# Patient Record
Sex: Male | Born: 1940 | Race: Black or African American | Hispanic: No | Marital: Married | Smoking: Never smoker
Health system: Southern US, Community
[De-identification: ages and names within clinical notes are randomized; demographics above are authoritative.]

## PROBLEM LIST (undated history)

## (undated) DIAGNOSIS — E785 Hyperlipidemia, unspecified: Secondary | ICD-10-CM

## (undated) DIAGNOSIS — H409 Unspecified glaucoma: Secondary | ICD-10-CM

## (undated) DIAGNOSIS — I712 Thoracic aortic aneurysm, without rupture, unspecified: Secondary | ICD-10-CM

## (undated) DIAGNOSIS — N400 Enlarged prostate without lower urinary tract symptoms: Secondary | ICD-10-CM

## (undated) DIAGNOSIS — K222 Esophageal obstruction: Secondary | ICD-10-CM

## (undated) DIAGNOSIS — N182 Chronic kidney disease, stage 2 (mild): Secondary | ICD-10-CM

## (undated) DIAGNOSIS — R42 Dizziness and giddiness: Secondary | ICD-10-CM

## (undated) DIAGNOSIS — M1990 Unspecified osteoarthritis, unspecified site: Secondary | ICD-10-CM

## (undated) DIAGNOSIS — K224 Dyskinesia of esophagus: Secondary | ICD-10-CM

## (undated) DIAGNOSIS — I10 Essential (primary) hypertension: Secondary | ICD-10-CM

## (undated) DIAGNOSIS — R001 Bradycardia, unspecified: Secondary | ICD-10-CM

## (undated) DIAGNOSIS — G4730 Sleep apnea, unspecified: Secondary | ICD-10-CM

## (undated) DIAGNOSIS — K4090 Unilateral inguinal hernia, without obstruction or gangrene, not specified as recurrent: Secondary | ICD-10-CM

## (undated) DIAGNOSIS — K219 Gastro-esophageal reflux disease without esophagitis: Secondary | ICD-10-CM

## (undated) DIAGNOSIS — K922 Gastrointestinal hemorrhage, unspecified: Secondary | ICD-10-CM

## (undated) HISTORY — DX: Unspecified osteoarthritis, unspecified site: M19.90

## (undated) HISTORY — PX: EYE SURGERY: SHX253

## (undated) HISTORY — PX: HERNIA REPAIR: SHX51

## (undated) HISTORY — DX: Thoracic aortic aneurysm, without rupture: I71.2

## (undated) HISTORY — DX: Benign prostatic hyperplasia without lower urinary tract symptoms: N40.0

## (undated) HISTORY — DX: Hyperlipidemia, unspecified: E78.5

## (undated) HISTORY — DX: Thoracic aortic aneurysm, without rupture, unspecified: I71.20

## (undated) HISTORY — DX: Dizziness and giddiness: R42

## (undated) HISTORY — DX: Essential (primary) hypertension: I10

## (undated) HISTORY — DX: Esophageal obstruction: K22.2

## (undated) HISTORY — DX: Chronic kidney disease, stage 2 (mild): N18.2

## (undated) HISTORY — PX: TONSILLECTOMY: SUR1361

## (undated) HISTORY — DX: Gastrointestinal hemorrhage, unspecified: K92.2

## (undated) HISTORY — DX: Bradycardia, unspecified: R00.1

## (undated) HISTORY — DX: Unilateral inguinal hernia, without obstruction or gangrene, not specified as recurrent: K40.90

## (undated) HISTORY — DX: Dyskinesia of esophagus: K22.4

---

## 1998-04-15 ENCOUNTER — Ambulatory Visit (HOSPITAL_COMMUNITY): Admission: RE | Admit: 1998-04-15 | Discharge: 1998-04-15 | Payer: Self-pay | Admitting: Cardiology

## 1998-10-24 ENCOUNTER — Encounter: Payer: Self-pay | Admitting: Emergency Medicine

## 1998-10-24 ENCOUNTER — Inpatient Hospital Stay (HOSPITAL_COMMUNITY): Admission: EM | Admit: 1998-10-24 | Discharge: 1998-10-28 | Payer: Self-pay | Admitting: Emergency Medicine

## 1998-10-26 ENCOUNTER — Encounter: Payer: Self-pay | Admitting: Cardiology

## 1998-10-27 ENCOUNTER — Encounter: Payer: Self-pay | Admitting: Cardiology

## 1998-11-15 HISTORY — PX: NECK SURGERY: SHX720

## 1999-10-29 ENCOUNTER — Encounter: Admission: RE | Admit: 1999-10-29 | Discharge: 1999-10-29 | Payer: Self-pay | Admitting: Specialist

## 1999-10-29 ENCOUNTER — Encounter: Payer: Self-pay | Admitting: Specialist

## 2000-01-13 ENCOUNTER — Encounter: Payer: Self-pay | Admitting: Neurosurgery

## 2000-01-15 ENCOUNTER — Inpatient Hospital Stay (HOSPITAL_COMMUNITY): Admission: RE | Admit: 2000-01-15 | Discharge: 2000-01-17 | Payer: Self-pay | Admitting: Neurosurgery

## 2000-01-15 ENCOUNTER — Encounter: Payer: Self-pay | Admitting: Neurosurgery

## 2000-01-21 ENCOUNTER — Encounter: Admission: RE | Admit: 2000-01-21 | Discharge: 2000-01-21 | Payer: Self-pay | Admitting: Neurosurgery

## 2000-01-21 ENCOUNTER — Encounter: Payer: Self-pay | Admitting: Neurosurgery

## 2000-03-03 ENCOUNTER — Encounter: Admission: RE | Admit: 2000-03-03 | Discharge: 2000-03-03 | Payer: Self-pay | Admitting: Neurosurgery

## 2000-03-03 ENCOUNTER — Encounter: Payer: Self-pay | Admitting: Neurosurgery

## 2000-03-31 ENCOUNTER — Encounter: Payer: Self-pay | Admitting: Neurosurgery

## 2000-03-31 ENCOUNTER — Encounter: Admission: RE | Admit: 2000-03-31 | Discharge: 2000-03-31 | Payer: Self-pay | Admitting: Neurosurgery

## 2000-07-07 ENCOUNTER — Encounter: Admission: RE | Admit: 2000-07-07 | Discharge: 2000-07-07 | Payer: Self-pay | Admitting: Neurosurgery

## 2000-07-07 ENCOUNTER — Encounter: Payer: Self-pay | Admitting: Neurosurgery

## 2000-08-25 ENCOUNTER — Ambulatory Visit (HOSPITAL_COMMUNITY): Admission: RE | Admit: 2000-08-25 | Discharge: 2000-08-25 | Payer: Self-pay | Admitting: Cardiology

## 2000-08-25 ENCOUNTER — Encounter: Payer: Self-pay | Admitting: Cardiology

## 2000-08-30 ENCOUNTER — Encounter: Payer: Self-pay | Admitting: Cardiology

## 2000-08-30 ENCOUNTER — Encounter: Admission: RE | Admit: 2000-08-30 | Discharge: 2000-08-30 | Payer: Self-pay | Admitting: Cardiology

## 2001-01-10 ENCOUNTER — Encounter: Payer: Self-pay | Admitting: Neurosurgery

## 2001-01-10 ENCOUNTER — Encounter: Admission: RE | Admit: 2001-01-10 | Discharge: 2001-01-10 | Payer: Self-pay | Admitting: Neurosurgery

## 2002-09-27 ENCOUNTER — Emergency Department (HOSPITAL_COMMUNITY): Admission: EM | Admit: 2002-09-27 | Discharge: 2002-09-27 | Payer: Self-pay

## 2003-02-18 ENCOUNTER — Ambulatory Visit (HOSPITAL_COMMUNITY): Admission: RE | Admit: 2003-02-18 | Discharge: 2003-02-18 | Payer: Self-pay | Admitting: Gastroenterology

## 2004-02-27 ENCOUNTER — Encounter: Admission: RE | Admit: 2004-02-27 | Discharge: 2004-02-27 | Payer: Self-pay | Admitting: Internal Medicine

## 2004-02-28 ENCOUNTER — Encounter: Admission: RE | Admit: 2004-02-28 | Discharge: 2004-02-28 | Payer: Self-pay | Admitting: Internal Medicine

## 2004-02-28 ENCOUNTER — Ambulatory Visit (HOSPITAL_COMMUNITY): Admission: RE | Admit: 2004-02-28 | Discharge: 2004-02-28 | Payer: Self-pay | Admitting: Internal Medicine

## 2004-08-25 ENCOUNTER — Ambulatory Visit (HOSPITAL_BASED_OUTPATIENT_CLINIC_OR_DEPARTMENT_OTHER): Admission: RE | Admit: 2004-08-25 | Discharge: 2004-08-25 | Payer: Self-pay | Admitting: Internal Medicine

## 2005-03-11 ENCOUNTER — Encounter: Admission: RE | Admit: 2005-03-11 | Discharge: 2005-03-11 | Payer: Self-pay | Admitting: Internal Medicine

## 2005-03-29 ENCOUNTER — Encounter (INDEPENDENT_AMBULATORY_CARE_PROVIDER_SITE_OTHER): Payer: Self-pay | Admitting: *Deleted

## 2005-03-29 ENCOUNTER — Ambulatory Visit (HOSPITAL_COMMUNITY): Admission: RE | Admit: 2005-03-29 | Discharge: 2005-03-29 | Payer: Self-pay | Admitting: Gastroenterology

## 2006-04-17 ENCOUNTER — Emergency Department (HOSPITAL_COMMUNITY): Admission: EM | Admit: 2006-04-17 | Discharge: 2006-04-17 | Payer: Self-pay | Admitting: *Deleted

## 2008-02-17 ENCOUNTER — Inpatient Hospital Stay (HOSPITAL_COMMUNITY): Admission: EM | Admit: 2008-02-17 | Discharge: 2008-02-18 | Payer: Self-pay | Admitting: Emergency Medicine

## 2008-03-11 ENCOUNTER — Ambulatory Visit (HOSPITAL_COMMUNITY): Admission: RE | Admit: 2008-03-11 | Discharge: 2008-03-11 | Payer: Self-pay | Admitting: *Deleted

## 2008-10-23 ENCOUNTER — Encounter: Admission: RE | Admit: 2008-10-23 | Discharge: 2008-10-23 | Payer: Self-pay | Admitting: Internal Medicine

## 2008-11-22 ENCOUNTER — Encounter: Admission: RE | Admit: 2008-11-22 | Discharge: 2008-11-22 | Payer: Self-pay | Admitting: Gastroenterology

## 2010-12-09 ENCOUNTER — Encounter
Admission: RE | Admit: 2010-12-09 | Discharge: 2010-12-09 | Payer: Self-pay | Source: Home / Self Care | Attending: Internal Medicine | Admitting: Internal Medicine

## 2010-12-24 ENCOUNTER — Ambulatory Visit (HOSPITAL_COMMUNITY)
Admission: RE | Admit: 2010-12-24 | Discharge: 2010-12-24 | Disposition: A | Discharge status: Home / Self Care | Payer: Medicare Other | Source: Ambulatory Visit | Attending: Gastroenterology | Admitting: Gastroenterology

## 2010-12-24 DIAGNOSIS — H409 Unspecified glaucoma: Secondary | ICD-10-CM | POA: Insufficient documentation

## 2010-12-24 DIAGNOSIS — I10 Essential (primary) hypertension: Secondary | ICD-10-CM | POA: Insufficient documentation

## 2010-12-24 DIAGNOSIS — E876 Hypokalemia: Secondary | ICD-10-CM | POA: Insufficient documentation

## 2010-12-24 DIAGNOSIS — D131 Benign neoplasm of stomach: Secondary | ICD-10-CM | POA: Insufficient documentation

## 2010-12-24 DIAGNOSIS — N400 Enlarged prostate without lower urinary tract symptoms: Secondary | ICD-10-CM | POA: Insufficient documentation

## 2010-12-24 DIAGNOSIS — K219 Gastro-esophageal reflux disease without esophagitis: Secondary | ICD-10-CM | POA: Insufficient documentation

## 2010-12-24 DIAGNOSIS — R1310 Dysphagia, unspecified: Secondary | ICD-10-CM | POA: Insufficient documentation

## 2010-12-29 NOTE — Op Note (Signed)
  NAME:  [name], [name] NO.:  [id number]  MEDICAL RECORD NO.:  [id number]           PATIENT TYPE:  O  LOCATION:  WLEN                         FACILITY:  [facility]  PHYSICIAN:  Danise Edge, M.D.   DATE OF BIRTH:  [birth year]  DATE OF PROCEDURE:  12/24/2010 DATE OF DISCHARGE:                              OPERATIVE REPORT   PROCEDURE:  Diagnostic esophagogastroduodenoscopy.  REFERRING PHYSICIAN:  Georgann Housekeeper, MD.  HISTORY:  [name] is a 70 year old male born Aug 08, 1941. The patient has chronic intermittent esophageal dysphagia.  Two years ago, the patient's barium esophagogram was normal. Esophagogastroduodenoscopy showed a normal esophagus and benign fundic gland gastric polyps.  Esophageal biopsies did not show eosinophilic esophagitis.  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  The patient is on proton pump inhibitor therapy to treat gastroesophageal reflux.  He also takes 2 potassium tablets twice a day. The patient tells me he has a great deal of difficulty consuming his potassium tablets which tend to cause esophageal burning pain.  ENDOSCOPIST:  Danise Edge, M.D.  PREMEDICATIONS: 1. Fentanyl 50 mcg. 2. Versed 7 mg.  DESCRIPTION OF PROCEDURE:  After obtaining informed consent, the patient was placed in the left lateral decubitus position.  I administered intravenous fentanyl and intravenous Versed to achieve conscious sedation for the procedure.  The patient's blood pressure, oxygen saturation and cardiac rhythm were monitored throughout the procedure and documented in the medical record.  The Pentax gastroscope was passed through the posterior hypopharynx into the proximal esophagus without difficulty.  The hypopharynx, larynx and vocal cords appeared normal.  Esophagoscopy:  The proximal mid and lower  segments of the esophageal mucosa appear completely normal.  There was no endoscopic evidence for the presence of erosive esophagitis, esophageal stricture formation, Barrett's esophagus or esophageal tumors.  Gastroscopy:  Retroflexed view of the gastric cardia and fundus was normal.  There are scattered small benign fundic gland gastric polyps less than 5 mm in diameter.  The gastric antrum and pylorus appear normal.  Duodenoscopy:  The duodenal bulb and descending duodenum appeared normal.  ASSESSMENT:  Normal esophagogastroduodenoscopy except for the presence of benign fundic gland gastric polyps less than 5 mm in diameter.  PLAN:  The patient must have esophageal dysmotility as a cause for his chronic intermittent dysphagia.  I will schedule him for esophageal manometry to look for achalasia.          ______________________________ Danise Edge, M.D.     MJ/MEDQ  D:  12/24/2010  T:  12/24/2010  Job:  [id number]  cc:   Georgann Housekeeper, MD Fax: [phone]  Electronically Signed by Danise Edge M.D. on 12/29/2010 02:38:14 PM

## 2011-03-01 ENCOUNTER — Ambulatory Visit (HOSPITAL_COMMUNITY)
Admission: RE | Admit: 2011-03-01 | Discharge: 2011-03-01 | Disposition: A | Discharge status: Home / Self Care | Payer: Medicare Other | Source: Ambulatory Visit | Attending: Gastroenterology | Admitting: Gastroenterology

## 2011-03-01 DIAGNOSIS — R1310 Dysphagia, unspecified: Secondary | ICD-10-CM | POA: Insufficient documentation

## 2011-03-09 ENCOUNTER — Observation Stay (HOSPITAL_COMMUNITY)
Admission: EM | Admit: 2011-03-09 | Discharge: 2011-03-12 | Disposition: A | Discharge status: Home / Self Care | Payer: Medicare Other | Attending: Internal Medicine | Admitting: Internal Medicine

## 2011-03-09 DIAGNOSIS — F411 Generalized anxiety disorder: Secondary | ICD-10-CM | POA: Insufficient documentation

## 2011-03-09 DIAGNOSIS — K224 Dyskinesia of esophagus: Secondary | ICD-10-CM | POA: Insufficient documentation

## 2011-03-09 DIAGNOSIS — Q391 Atresia of esophagus with tracheo-esophageal fistula: Secondary | ICD-10-CM | POA: Insufficient documentation

## 2011-03-09 DIAGNOSIS — K219 Gastro-esophageal reflux disease without esophagitis: Secondary | ICD-10-CM | POA: Insufficient documentation

## 2011-03-09 DIAGNOSIS — K5730 Diverticulosis of large intestine without perforation or abscess without bleeding: Secondary | ICD-10-CM | POA: Insufficient documentation

## 2011-03-09 DIAGNOSIS — I10 Essential (primary) hypertension: Secondary | ICD-10-CM | POA: Insufficient documentation

## 2011-03-09 DIAGNOSIS — R195 Other fecal abnormalities: Principal | ICD-10-CM | POA: Insufficient documentation

## 2011-03-09 HISTORY — DX: Essential (primary) hypertension: I10

## 2011-03-09 LAB — PROTIME-INR
INR: 0.98 (ref 0.00–1.49)
Prothrombin Time: 13.2 seconds (ref 11.6–15.2)

## 2011-03-09 LAB — DIFFERENTIAL
Basophils Absolute: 0 10*3/uL (ref 0.0–0.1)
Basophils Relative: 1 % (ref 0–1)
Eosinophils Absolute: 0.1 10*3/uL (ref 0.0–0.7)
Eosinophils Relative: 2 % (ref 0–5)
Lymphocytes Relative: 24 % (ref 12–46)
Lymphs Abs: 1.5 10*3/uL (ref 0.7–4.0)
Monocytes Absolute: 0.7 10*3/uL (ref 0.1–1.0)
Monocytes Relative: 11 % (ref 3–12)
Neutro Abs: 4.1 10*3/uL (ref 1.7–7.7)
Neutrophils Relative %: 64 % (ref 43–77)

## 2011-03-09 LAB — COMPREHENSIVE METABOLIC PANEL
ALT: 15 U/L (ref 0–53)
AST: 18 U/L (ref 0–37)
Albumin: 3.6 g/dL (ref 3.5–5.2)
Alkaline Phosphatase: 69 U/L (ref 39–117)
BUN: 13 mg/dL (ref 6–23)
CO2: 27 mEq/L (ref 19–32)
Calcium: 8.5 mg/dL (ref 8.4–10.5)
Chloride: 104 mEq/L (ref 96–112)
Creatinine, Ser: 1.01 mg/dL (ref 0.4–1.5)
GFR calc Af Amer: >60 mL/min (ref >60)
GFR calc non Af Amer: >60 mL/min (ref >60)
Glucose, Bld: 94 mg/dL (ref 70–99)
Potassium: 3.3 mEq/L — ABNORMAL LOW (ref 3.5–5.1)
Sodium: 138 mEq/L (ref 135–145)
Total Bilirubin: 0.6 mg/dL (ref 0.3–1.2)
Total Protein: 6.7 g/dL (ref 6.0–8.3)

## 2011-03-09 LAB — URINALYSIS, ROUTINE W REFLEX MICROSCOPIC
Bilirubin Urine: NEGATIVE
Glucose, UA: NEGATIVE mg/dL
Hgb urine dipstick: NEGATIVE
Ketones, ur: NEGATIVE mg/dL
Nitrite: NEGATIVE
Protein, ur: NEGATIVE mg/dL
Specific Gravity, Urine: 1.011 (ref 1.005–1.030)
Urobilinogen, UA: 0.2 mg/dL (ref 0.0–1.0)
pH: 7 (ref 5.0–8.0)

## 2011-03-09 LAB — CBC
HCT: 42.5 % (ref 39.0–52.0)
Hemoglobin: 14.7 g/dL (ref 13.0–17.0)
MCH: 30.3 pg (ref 26.0–34.0)
MCHC: 34.6 g/dL (ref 30.0–36.0)
MCV: 87.6 fL (ref 78.0–100.0)
Platelets: 185 10*3/uL (ref 150–400)
RBC: 4.85 MIL/uL (ref 4.22–5.81)
RDW: 13.6 % (ref 11.5–15.5)
WBC: 6.4 10*3/uL (ref 4.0–10.5)

## 2011-03-09 LAB — TYPE AND SCREEN
ABO/RH(D): AB POS
Antibody Screen: NEGATIVE

## 2011-03-09 LAB — ABO/RH: ABO/RH(D): AB POS

## 2011-03-09 LAB — APTT: aPTT: 27 seconds (ref 24–37)

## 2011-03-10 LAB — CBC
HCT: 41.8 % (ref 39.0–52.0)
Hemoglobin: 14.4 g/dL (ref 13.0–17.0)
MCH: 30.1 pg (ref 26.0–34.0)
MCHC: 34.4 g/dL (ref 30.0–36.0)
MCV: 87.4 fL (ref 78.0–100.0)
Platelets: 198 10*3/uL (ref 150–400)
RBC: 4.78 MIL/uL (ref 4.22–5.81)
RDW: 13.7 % (ref 11.5–15.5)
WBC: 6.4 10*3/uL (ref 4.0–10.5)

## 2011-03-10 LAB — HEMOCCULT GUIAC POC 1CARD (OFFICE): Fecal Occult Bld: POSITIVE

## 2011-03-10 NOTE — Op Note (Signed)
  NAME:  [name], [name] NO.:  [id number]  MEDICAL RECORD NO.:  [id number]           PATIENT TYPE:  O  LOCATION:  5504                         FACILITY:  [facility]  PHYSICIAN:  Danise Edge, M.D.   DATE OF BIRTH:  1941-03-24  DATE OF PROCEDURE:  03/10/2011 DATE OF DISCHARGE:                              OPERATIVE REPORT   HISTORY:  [name] is a 70 year old male born [birth year]. The patient has chronic intermittent esophageal dysphagia.  Two years ago, the patient's barium esophagram was normal. Esophagogastroduodenoscopy showed a normal esophagus and benign fundic gland gastric polyps.  Esophageal biopsies did not show eosinophilic esophagitis.  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  The patient is on proton pump inhibitor therapy to treat gastroesophageal reflux.  He does take two potassium tablets daily.  The patient tells me that he has a great deal of difficulty consuming his potassium tablets, which tend to cause esophageal burning discomfort.  On December 24, 2010, the patient underwent a diagnostic esophagogastroduodenoscopy, which was normal except for the presence of benign fundic gland gastric polyps less than 5 mm in diameter.  Esophageal manometry was scheduled to rule out achalasia.  Unfortunately, the patient had a great deal of difficulty during the examination.  He experienced gagging, which compromised data obtained from the upper esophageal sphincter and upper esophageal body.  Upper esophageal sphincter data:  No data obtained.  Esophageal body data:  Studies were obtained from the lower esophageal body and not the upper esophageal body.  Ten wet swallows were performed, resulting in 90% peristaltic contractions and 10% retrograde contractions.  Peristaltic contractions had a normal wave  amplitude of 138 mmHg.  Lower esophageal sphincter data:  The resting lower esophageal sphincter pressure is elevated at 60 mmHg.  There is 91% relaxation of the lower esophageal sphincter with swallows leaving a residual pressure of 5.1 mmHg, which is normal.  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  There is 10% non-peristaltic esophageal body contractions with swallows.  RECOMMENDATIONS:  I would recommend discontinuing potassium chloride tablets, which can cause esophageal mucosal injury.  I would switch to liquid potassium chloride if potassium is necessary.  I would also recommend using sublingual nitroglycerin to treat episodes of esophageal dysphagia.  The patient does not meet the criteria for achalasia.  If the above recommendations do not improve his symptoms, he may be a candidate for at least one trial of Botox injection into the lower esophageal sphincter muscle.          ______________________________ Danise Edge, M.D.  MJ/MEDQ  D:  03/10/2011  T:  03/10/2011  Job:  [id number]  cc:   Georgann Housekeeper, MD Fax: [phone]  Electronically Signed by Danise Edge M.D. on 03/10/2011 12:08:14 PM

## 2011-03-11 ENCOUNTER — Observation Stay (HOSPITAL_COMMUNITY): Payer: Medicare Other

## 2011-03-11 ENCOUNTER — Encounter (HOSPITAL_COMMUNITY): Payer: Self-pay | Admitting: Radiology

## 2011-03-11 LAB — CBC
HCT: 38.7 % — ABNORMAL LOW (ref 39.0–52.0)
Hemoglobin: 13.2 g/dL (ref 13.0–17.0)
MCH: 30.2 pg (ref 26.0–34.0)
MCHC: 34.1 g/dL (ref 30.0–36.0)
MCV: 88.6 fL (ref 78.0–100.0)
Platelets: 181 10*3/uL (ref 150–400)
RBC: 4.37 MIL/uL (ref 4.22–5.81)
RDW: 13.6 % (ref 11.5–15.5)
WBC: 5.3 10*3/uL (ref 4.0–10.5)

## 2011-03-11 MED ORDER — IOHEXOL 300 MG/ML  SOLN
100.0000 mL | Freq: Once | INTRAMUSCULAR | Status: AC | PRN
  Administered 2011-03-11: 100 mL via INTRAVENOUS

## 2011-03-12 LAB — CBC
HCT: 37.6 % — ABNORMAL LOW (ref 39.0–52.0)
Hemoglobin: 12.7 g/dL — ABNORMAL LOW (ref 13.0–17.0)
MCH: 29.8 pg (ref 26.0–34.0)
MCHC: 33.8 g/dL (ref 30.0–36.0)
MCV: 88.3 fL (ref 78.0–100.0)
Platelets: 191 10*3/uL (ref 150–400)
RBC: 4.26 MIL/uL (ref 4.22–5.81)
RDW: 13.7 % (ref 11.5–15.5)
WBC: 5.8 10*3/uL (ref 4.0–10.5)

## 2011-03-14 NOTE — Discharge Summary (Signed)
NAMEFAITH, Bob Elliott NO.:  [id number]  MEDICAL RECORD NO.:  [id number]           PATIENT TYPE:  O  LOCATION:  5504                         FACILITY:  [facility]  PHYSICIAN:  Georgann Housekeeper, MD      DATE OF BIRTH:  September 09, 1941  DATE OF ADMISSION:  03/09/2011 DATE OF DISCHARGE:  03/12/2011                              DISCHARGE SUMMARY   DISCHARGING DIAGNOSES: 1. GI bleed, melena with possible source, diverticular bleed. 2. Hypertension. 3. Anxiety. 4. Gastroesophageal reflux disease, esophageal dysmotility. 5. History of Schatzki's ring.  MEDICATIONS ON DISCHARGE: 1. Cardura 4 mg at bedtime. 2. Mavik 4 mg b.i.d. 3. Xanax 1 mg at bedtime p.r.n. 4. Flonase nasal spray, 2 sprays daily p.r.n. 5. Nexium 40 mg daily. 6. Potassium 10 mEq 2 tablets twice a day. 7. Claritin 10 mg 1 daily p.r.n. 8. Diltiazem 180 mg 3 tablets (360 mg daily). 9. Carafate 1 gram per 10 mL suspension 10 mL t.i.d. p.r.n.  PROCEDURES: 1. Colonoscopy showed several diverticuli in the cecum, no active     bleed. 2. EGD showed Schatzki's ring, no evidence of ulcer or acute bleed.     CT scan of the abdomen negative for any tumor, incidental benign     renal cyst.  CONSULTATION:  GI.  LABORATORY DATA:  Blood counts:  Hemoglobin initially on admission was 14, dropped down to 12.7, white count of 5.8, platelets 191,000. Chemistries:  Sodium 130, potassium 3.3, creatinine 1.0, BUN of 13.  HOSPITAL COURSE:  A 70 year old male admitted with melena, possible hematochezia, hemoglobin was normal, hemodynamically stable. 1. GI:  The patient had a GI consultation for evaluation of his source     of the GI bleed.  He underwent an EGD which revealed only     Schatzki's without any active source of bleeding or ulcer.     Subsequently he went to have a colonoscopy which showed several     cecal diverticular areas without any active source thought to be     probable fecal diverticular bleed, resolved  spontaneously.  He had     a CT of the abdomen and pelvic for evaluation of small bowel area     which did not show any acute pathology, incidental renal cyst. 2. Esophageal dysmotility and GERD.  The patient had recently     outpatient workup which included esophageal manometry which showed     some esophageal dysmotility as far as history of longstanding GERD     with Schatzki's ring, could give intermittent dysphagia.  The     patient continued on Nexium.  Carafate was added as p.r.n.  No     aspirin products. 3. As far as the hypertension and hypokalemia, continue on blood     pressure medication.  Blood pressure remained stable for potassium.     Discussion about potassium elixir versus continuing the pill, we     will discuss that outpatient, encourage him to use lot of water     when he takes potassium tablet. 4. As far as his anxiety, continue on Xanax p.r.n.  I will see  him     back in 2 weeks outpatient.  Discharge planning taken over 30 minutes.     Georgann Housekeeper, MD     KH/MEDQ  D:  03/12/2011  T:  03/12/2011  Job:  [id number]  Electronically Signed by Georgann Housekeeper MD on 03/14/2011 11:37:47 AM

## 2011-03-15 NOTE — H&P (Signed)
NAME:  ANITA, MCADORY NO.:  [id number]  MEDICAL RECORD NO.:  [id number]           PATIENT TYPE:  E  LOCATION:  [facility]                         FACILITY:  [facility]  PHYSICIAN:  Lonia Blood, M.D.       DATE OF BIRTH:  02-Jun-1941  DATE OF ADMISSION:  03/09/2011 DATE OF DISCHARGE:                             HISTORY & PHYSICAL   PRIMARY CARE PHYSICIAN:  Georgann Housekeeper, MD  CHIEF COMPLAINT:  Bloody stools, then the patient changed his story and said black stools.  HISTORY OF PRESENT ILLNESS:  Mr. Dentinger is a 70 year old gentleman with history of osteoarthritis, hypertension, glaucoma, who presents to the emergency room today complaining of some burning abdominal pain as well as passing 2 bloody bowel movements.  When asked further, he claims he has been having black stools for the past week since having some tests which sounds like an esophageal manometry test.  The patient said he got that test because of dysphagia and ever since the test he has been feeling kind of sick and passing black stools.  He has no nausea, no vomiting.  No episodes of syncope.  He took briefly some Aleve for about 4 days last week for some shoulder pain.  PAST MEDICAL HISTORY:  Hypertension, osteoarthritis, gastroesophageal reflux disease, and glaucoma.  SOCIAL HISTORY:  He is retired.  Does not smoke cigarettes, does not drink alcohol.  Lives with his wife.  FAMILY HISTORY:  Positive hypertension.  REVIEW OF SYSTEMS:  As per HPI.  Also positive for dysphagia, shoulder pain, knee pain, otherwise negative.  PHYSICAL EXAMINATION:  VITAL SIGNS:  Upon admission, temperature 98.2, blood pressure 142/81, pulse 79, respirations 20, saturation 95% on room air.  GENERAL APPEARANCE:  Well-developed, nourished, no acute distress. HEENT:  Head normocephalic, atraumatic.  Eyes, pupils equal, round, react to light and accommodation.  Extraocular movements intact.  Throat clear. NECK:  Supple.  No  JVD. CHEST:  Clear to auscultation without wheezes, rhonchi, or crackles. HEART:  Regular rate and rhythm without murmurs, rubs, or gallops. ABDOMEN:  Soft and with some mild epigastric tenderness.  No rebound, no guarding. EXTREMITIES:  Lower extremities without edema. SKIN:  Warm, dry.  No suspicious looking rashes.  LABORATORY VALUES:  On admission, white blood cell count is 6.4, hemoglobin 14.7, platelet count is 185.  Sodium 138, potassium 3.3, chloride 104, BUN 13, creatinine 1.  Urinalysis within normal limits.  ASSESSMENT AND PLAN:  This is a 70 year old gentleman who is status post and unspecified gastroenterology and urological procedure status post NSAID use, coming in the hospital complaining of melena and bloody bowel movements.  Emergency room physician has performed a rectal exam and found heme-positive stools.  There are also some nonbleeding hemorrhoids.  The patient is placed on observation overnight with a repeat CBC in the morning.  Clear liquid diet.  Emergency room physician has already contacted Dr. Dulce Sellar who is going to see the patient in consultation. Otherwise, Mr. Fidalgo will be continued on his Cardura, Dilacor, Mavik, potassium, and Xanax.     Lonia Blood, M.D.     SL/MEDQ  D:  03/09/2011  T:  03/09/2011  Job:  [id number]  cc:   Georgann Housekeeper, MD  Electronically Signed by Lonia Blood M.D. on 03/15/2011 09:18:05 PM

## 2011-03-26 NOTE — Consult Note (Signed)
Bob Elliott, GAPPA NO.:  [id number]  MEDICAL RECORD NO.:  [id number]          PATIENT TYPE:  LOCATION:                                 FACILITY:  PHYSICIAN:  Bob Elliott, M.D.   DATE OF BIRTH:  [birth year]  DATE OF CONSULTATION:  03/10/2011 DATE OF DISCHARGE:                                CONSULTATION   Dr. Donette Larry asked [place] to see this 70 year old gentleman because of GI bleeding.  The patient has been followed by Dr. Danise Edge for esophageal dysphagia symptoms.  Although a recent upper GI series raised the question of a distal esophageal stricture, Dr. Henriette Combs recent endoscopy did not, so no dilatation was performed.  An esophageal manometry was performed about a week ago and was a difficult exam due to gagging and retching, but per Dr. Henriette Combs verbal report to me, it showed a hypertensive lower esophageal sphincter and some retrograde peristalsis.  With that background, the patient was noted to have some to make had several large bloody bowel movements, fairly bright red, I believe beginning yesterday morning.  There is no frank syncope, but I think he might have had some mild dizziness.  Prior to this, since the manometry, he had noted what he thought were some dark stools, but they were distinctly different from the above-mentioned bloody stool that began yesterday.  He came to the emergency room where his hemoglobin was stable.  Of note, the patient had colonoscopy in March 2009 by Dr. Laural Benes that showed some small cecal diverticula.  His upper endoscopy, in February of this year, was normal.  PAST MEDICAL HISTORY:  Allergies to ASPIRIN (GI upset) and CODEINE (mental status changes).  OUTPATIENT MEDICATIONS:  Not recorded, but in the hospital he is on Xanax, diltiazem, Cardura, pantoprazole, MiraLax, potassium chloride, sucralfate __________.  OPERATIONS:  I do not believe there has been prior abdominal surgery.  CHRONIC MEDICAL  ILLNESSES: 1. Hypertension. 2. DJD. 3. GERD.  PHYSICAL EXAMINATION:  GENERAL:  A stout well-developed African American male in no acute distress. EYES:  Anicteric.  No evident pallor. CHEST:  Clear. HEART:  Normal. ABDOMEN:  Without guarding, mass, or tenderness. RECTAL:  Fairly dark maroon, almost burgundy, pasty bloody stool.  LABORATORY DATA:  White count 6400, admission hemoglobin 14.7, following overnight hydration 14.4, platelets 185.  Differential count normal. INR 1.0.  Chemistry panel pertinent for BUN of 13 on admission with creatinine of 1.0.  Normal liver chemistries.  Albumin 3.6.  IMPRESSION:  Non-destabilizing gastrointestinal bleed, most likely of lower tract origin given the appearance of the stool, his hemodynamic stability, and normal hemoglobin and the normal BUN.  PLAN:  Begin with endoscopic evaluation at this time in view of the history of dark stools and recent traumatic manometry.  Further management to depend on those findings.  The procedure has been discussed with the patient and he is agreeable to proceed.  If it is negative, colonoscopic evaluation would then be appropriate since it has been 3 years since his last colonoscopy.  I would wonder about a proximal diverticular origin of his bleeding, given the relatively dark stool  and the fact that the BUN is normal.          ______________________________ Bob Elliott, M.D.     RB/MEDQ  D:  03/10/2011  T:  03/11/2011  Job:  [id number]  cc:   Georgann Housekeeper, MD  Electronically Signed by Bob Elliott M.D. on 03/26/2011 10:08:47 AM

## 2011-03-30 NOTE — Op Note (Signed)
NAME:  ROLEN, CONGER NO.:  [id number]   MEDICAL RECORD NO.:  [id number]          PATIENT TYPE:  AMB   LOCATION:  DAY                          FACILITY:  [facility]   PHYSICIAN:  Alfonse Ras, MD   DATE OF BIRTH:  [birth year]   DATE OF PROCEDURE:  03/11/2008  DATE OF DISCHARGE:                               OPERATIVE REPORT   PREOPERATIVE DIAGNOSIS:  Umbilical hernia, incarcerated.   POSTOPERATIVE DIAGNOSIS:  Umbilical hernia, incarcerated.   PROCEDURE:  Incarcerated umbilical hernia repair with mesh, Bard 1 x 4  mesh.   ANESTHESIA:  General endotracheal tube.   SURGEON:  Alfonse Ras, MD.   DESCRIPTION:  The patient was taken to the operating room after proper  informed consent, explaining risks, benefits, and options to the patient  was obtained.  He was given general endotracheal anesthesia after being  placed in the supine position.  The abdomen was prepped and draped in  normal sterile fashion.  Using a transverse infraumbilical incision, I  dissected down onto the hernia sac.  It was mobilized completely along  the fascial defect which measured only about 1 cm.  Hernia contents were  reduced into the abdominal cavity and the defect was closed with  interrupted #1 Novofil.  A small piece of 8 Bard mesh was then placed  over the repair and tacked in the periphery using a 2-0 Prolene suture.  Subcutaneous tissue was closed with a running 3-0 Vicryl.  Skin was  closed with subcuticular 4-0 Monocryl.  Steri-Strips and sterile  dressings were applied.  Tissues were injected using 0.5 Marcaine.  The  patient tolerated the procedure well and went to PACU in good condition.      Alfonse Ras, MD  Electronically Signed     KRE/MEDQ  D:  03/11/2008  T:  03/11/2008  Job:  [id number]

## 2011-03-30 NOTE — H&P (Signed)
NAME:  Bob Elliott, Bob Elliott NO.:  [id number]   MEDICAL RECORD NO.:  [id number]          PATIENT TYPE:  EMS   LOCATION:  MAJO                         FACILITY:  [facility]   PHYSICIAN:  Gardiner Barefoot, MD    DATE OF BIRTH:  04/15/1941   DATE OF ADMISSION:  02/17/2008  DATE OF DISCHARGE:                              HISTORY & PHYSICAL   PRIMARY CARE PHYSICIAN:  Georgann Housekeeper, M.D., [facility].   CHIEF COMPLAINT:  Chest pain.   HISTORY OF PRESENT ILLNESS:  This is a 70 year old male with a history  of cervical radiculopathy, who presents here with acute onset of right  arm chest pain, actually it was from the posterior neck down his arm.  This started about 10:00 p.m. while playing cards. This evolved into mid  sternal chest pressure and chest pain and tightness at approximately 1  to 2 a.m. this morning and patient was brought into the emergency room  for evaluation. The patient did take a Darvocet and his chest pain did  eventually improve. The patient reports this pain is not similar to the  pain he has had associated with his cervical pain. Otherwise, he had had  no significant changes and no fever or any other symptoms. He denies  nausea and vomiting. No diaphoresis. He does report some shortness of  breath that is position, lying on the right side.   PAST MEDICAL HISTORY:  1. Hypertension.  2. Gastroesophageal reflux disease.  3. Anxiety.  4. Arthritis.  5. Glaucoma.   MEDICATIONS:  1. Cardura 4 mg p.o. daily.  2. Darvocet N-100 p.r.n.  3. Dilacor XR 360 mg q. a.m.  4. Mavik 4 mg p.o. b.i.d.  5. Potassium chloride 20 meq p.o. b.i.d.  6. Xanax 1 mg q.h.s.   ALLERGIES:  ASPIRIN, CODEINE.   SOCIAL HISTORY:  The patient denies smoking, alcohol, or drug use.   FAMILY HISTORY:  Noncontributory.   REVIEW OF SYSTEMS:  Negative except as per the HPI.   PHYSICAL EXAMINATION:  VITAL SIGNS:  Temperature 97.9, pulse 52,  respiratory rate 18. Blood pressure  128/84. O2 sat 98%.  GENERAL:  The patient is awake, alert and oriented x3 and appears in no  acute distress.  CARDIOVASCULAR:  Regular rate and rhythm. No murmur, rub, or gallop.  LUNGS:  Clear to auscultation bilaterally.  ABDOMEN:  Soft, nontender, and mild distention. Obese with some  umbilical hernia.  EXTREMITIES:  No clubbing, cyanosis, or edema.   LABORATORY DATA:  CBC within normal limits. CK MB less than 1.0.  Troponin is less than 0.05. Potassium 3.3. Otherwise, labs are normal.   Chest x-ray with no acute disease.   ASSESSMENT/PLAN:  1. Chest pain. Will admit the patient for observation on telemetry and      cycle enzymes and treat chest pain as needed.  2. Hypertension. Will continue the patient's home medications.      Gardiner Barefoot, MD  Electronically Signed     RWC/MEDQ  D:  02/17/2008  T:  02/17/2008  Job:  [id number]   cc:  Georgann Housekeeper, MD

## 2011-03-30 NOTE — Discharge Summary (Signed)
NAMEELVA, BREAKER NO.:  [id number]   MEDICAL RECORD NO.:  [id number]          PATIENT TYPE:  INP   LOCATION:  3733                         FACILITY:  [facility]   PHYSICIAN:  Deirdre Peer. Polite, M.D. DATE OF BIRTH:  Mar 09, 1941   DATE OF ADMISSION:  02/17/2008  DATE OF DISCHARGE:  02/18/2008                               DISCHARGE SUMMARY   DISCHARGE DIAGNOSES:  1. Atypical chest pain.  EKG, nonacute cardiac. Enzymes negative x3.      CT of the chest negative despite of minimally elevated D-dimer of      0.6.  2. Cervical disc disease.  3. Hypertension.  4. Anxiety.  5. Glaucoma.   DISCHARGE MEDICATIONS:  The patient to resume home medications, which  includes:  1. Cardura 4 mg daily.  2. Darvocet q.6 h. p.r.n.  3. Dilacor 360 mg daily.  4. Mavik 4 mg b.i.d.  5. Potassium 20 mEq b.i.d.  6. Xanax 1 mg nightly.  The patient was also to resume his eye drop that he states he takes  Betoptic nightly.   STUDIES:  CT of the chest negative for PE, cardiac enzymes negative x3.  Troponin level 0.6.  BMET within normal limits except for mild  hypokalemia at 3.3.  Lipid panel, total cholesterol 179, HDL 37, and LDL  115.   HISTORY OF PRESENT ILLNESS:  A 70 year old male presented to the ED with  atypical chest pain.  He described pain in his right arm, right chest,  and also on his right neck.  Stated it was different than his typical  neck pain.  This pain occurred at rest in the ED.  The patient was  evaluated.  Admission was deemed necessary for further evaluation and  treatment.  On further question, the patient thinks that he may have had  a normal stress test with minimal years 1-2 years.   PAST MEDICAL HISTORY:  As stated above.   MEDICATIONS ON ADMISSION:  As stated above.   ALLERGIES:  Per admission H and P.   SOCIAL HISTORY:  Negative tobacco, alcohol, or drugs.   HOSPITAL COURSE:  The patient was admitted to telemetry floor bed for  evaluation and  treatment of chest pain.  Enzymes at cycle, they were  negative.  The patient was continued on monitor without arrhythmia.  The  patient had D-dimer that was mildly elevated.  Followup CT of the chest  was ordered which was negative for PE.  The patient  did not have any recurrent pain in the hospital and thinks that his pain  was related to his neck at the time of discharge.  He plans to followup  with his primary MD and his abdomen specialist.  The patient is  medically stable for discharge to home.      Deirdre Peer. Polite, M.D.  Electronically Signed     RDP/MEDQ  D:  02/18/2008  T:  02/18/2008  Job:  [id number]   cc:   Georgann Housekeeper, MD

## 2011-04-02 NOTE — H&P (Signed)
[place]. [facility]  Patient:    Bob Elliott, Bob Elliott                          MRN: [id number] Adm. 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  INDICATIONS:  Mr. Yehudah Standing presented to my office on December 07, 1999 for evaluation of pain in the left upper extremity, which he has had for six years. He was seen and evaluated by Dr. Vira Browns for a three-year period.  By his account, when he mentioned that he wanted a second opinion he and his physician had somewhat of a falling out and he sought care elsewhere.  He has had no bowel or  bladder dysfunction.  He does have numbness in the hands, along with tingling. He has had pain in the left shoulder and left arm for a long period of time.  He has some minor tingling in the right hand, which is relatively new for him.  Almost all of his problems have been in the left upper extremity.  He is currently retired.  REVIEW OF SYSTEMS:  Positive for glaucoma, nasal congestion, sinus problems, hypertension, difficulty starting the urinary stream, arm weakness, back pain, rm pain, joint pain, arthritis, and neck pain.  He denies constitutional, respiratory, gastrointestinal, skin, neurological, psychiatric, endocrine, hematologic, and allergic problems.  PAST MEDICAL HISTORY:  Hypertension and gastrointestinal problems.  ALLERGIES: 1. ASPIRIN. 2. CODEINE.  CURRENT MEDICATIONS: 1. Dilacor XR 240/380 mg once per day. 2. OptiPranolol 1 drop twice a day for glaucoma. 3. K-Dur 20 mEq. 4. ______ hydrochlorothiazide 12.5 mg. 5. Alprazolam 1 mg once per day. 6. Vioxx 25 mg once per day.  FAMILY HISTORY:  Mother age 72 and has heart disease and problems with potassium. Father is deceased.  Father had prostate cancer and a cerebrovascular accident.  Colon cancer and diabetes is present in the  family history.  PAST SURGICAL HISTORY:  No previous operations.  SOCIAL HISTORY:  He does not smoke nor does he drink.  He had a problem with alcohol in the past.  PHYSICAL EXAMINATION:  VITAL SIGNS:  He is 5 feet 8-1/4 inches tall, weighing 208 pounds.  He has a pulse of 80 on exam.  GENERAL:  Mr. Hensley is alert and oriented x 4.  Answers all questions appropriately.  Memory, language, and attention span are normal.  He is well-kept and in no distress.  NEUROLOGIC:  Pupils are equal, round and reactive to light.  Funduscopic examination is normal.  Visual fields are full.  He has symmetric facial sensation and symmetric facial movements.  Hearing is intact to voice bilaterally.  Uvula  elevates in the midline.  Shoulder shrug is normal.  Tongue protrudes in the midline.  Slight weakness in the left deltoid at 4/5.  Otherwise very good strength in the left upper extremity.  Normal strength on the right side.  He has good grip, good intrinsics, good wrist extension.  No weakness in the lower extremities. Muscle tone, bulk, and coordination are normal.  He has 2+ reflexes in the upper and lower extremities.  No Hoffmanns sign.  No clonus was appreciated.  He has downgoing toes to plantar stimulation.  Pulses are good at the wrists and feet bilaterally.  LUNGS:  Lung fields are clear.  HEART:  Regular rhythm and rate.  No murmurs or rubs.  NECK:  Cervical area, no bruits or masses.  IMPRESSION:  Plain x-ray shows significant spondylitic disease at C5-6, 6-7, and 4-5.  He has anterior spurs at C4, C5, and C6-7.  He has a narrow disk space at  C5-6 and at C4-5.  Oblique films show foraminal stenosis at 5-6, 6-7 where it is most severe, and some also at C4-5.  Mr. Gaulin MRI showed two disks at 5-6 and 6-7, both on the left side, causing obvious compression of C6-7 nerve roots.  I  explained to Mr. Vandermeulen that I did not feel conservative treatment would  help. It appears both disk and osteophytes at these levels.  He had significant spondylosis present on plain films at 5-6 and 6-7.  He did have anterior spurs at 4-5, 5-6, and 6-7.  The MRI did not show a significant disk at C4-5.  The pain was located in the shoulder going down into the arm and into the hand.  He had some mild deltoid weakness.  I have proposed doing an anterior cervical diskectomy at possibly C4-5, 5-6, and 6-7.  The risks of an anterior cervical diskectomy and fusion were explained, being bleeding, infection, no pain relief, worsening of pain, fusion  failure, hardware failure, damage to the esophagus, problems with swallowing, problems with his voice, and damage to the recurrent laryngeal nerve.  He understands and wishes to proceed. DD:  01/15/00 TD:  01/15/00 Job: [id number]

## 2011-04-02 NOTE — Procedures (Signed)
NAME:  Bob Elliott, STEMEN NO.:  [id number]   MEDICAL RECORD NO.:  [id number]          PATIENT TYPE:  OUT   LOCATION:  SLEEP CENTER                 FACILITY:  [facility]   PHYSICIAN:  Clinton D. Maple Hudson, M.D. DATE OF BIRTH:  11/23/1940   DATE OF STUDY:  08/25/2004                              NOCTURNAL POLYSOMNOGRAM   REFERRING PHYSICIAN:  Georgann Housekeeper, M.D.   STUDY DATE:  August 25, 2004   INDICATION FOR THE STUDY:  Hypersomnia with sleep apnea.   EPWORTH SLEEPINESS SCORE:  13/24   BODY MASS INDEX:  32   WEIGHT:  214 pounds   MEDICATION INCLUDES:  Alprazolam which was taken before study.   SLEEP ARCHITECTURE:  Total sleep time was short 216 minutes with sleep  efficiency 56%.  Stage I was 8%, stage II 81%, stages III and IV were  absent, REM was 11% of total sleep time.  Sleep latency 111 minutes, REM  latency 62 minutes.  Awake after sleep onset 62 minutes, arousal index 8.3.  Recorded sleep onset was 12:24 a.m.  Patient did not indicate any specific  problems on this study night.   RESPIRATORY DATA:  RDI 4.7/hr which is within normal limits.  This included  7 obstructive apneas and 10 hypopneas.  Events were not positional and were  strongly associated with REM, REM RDI 30/hr.   OXYGEN DATA:  Loud snoring with oxygen desaturation to a nadir of 78%.  Mean  oxygen saturation through the study was 90-92% on room air.   CARDIAC DATA:  Sinus bradycardia around 51-52 beats per minute.   MOVEMENT/PARASOMNIA:  Insignificant limb jerks with no associated sleep  disturbance.   IMPRESSION/RECOMMENDATION:  Occasional obstructive sleep apnea/hypopnea  events, within upper limits of normal at an respiratory disturbance index of  4.7/hr.  Events were associated with loud snoring and brief oxygen  desaturation.  Sleep position is limited by previous back surgery but  conservative measures including weight loss,  control of nasal obstruction, and choice of sleep position  may be  beneficial.  Rapid eye movement suppression therapy with a tricyclic  antidepressant may be of some benefit.      CDY/MEDQ  D:  08/30/2004 10:48:36  T:  08/30/2004 20:20:46  Job:  [id number]

## 2011-04-02 NOTE — Discharge Summary (Signed)
[place]. [facility]  Patient:    Bob Elliott, Bob Elliott                          MRN: [id number] Adm. Date:  [id number] Disc. Date: [id number] Attending:  Coletta Memos                           Discharge Summary  2ADMISSION DIAGNOSIS:  Cervical spondylosis at C4-5, C5-6, and C6-7 with cervical radiculopathy.  POSTOPERATIVE DIAGNOSIS:  Cervical spondylosis at C4-5, C5-6, and C6-7 with cervical radiculopathy.  DISCHARGE DIAGNOSES:  Cervical spondylosis at C4-5, C5-6, and C6-7 with cervical radiculopathy.  CONDITION ON DISCHARGE:  Improving.  HOSPITAL COURSE:  The patient is a 70 year old individual who has had neck, shoulder, and arm pain.  He had been evaluated and found to have severe spondylitic disease at C5-6 and C6-7, moderate disease at C4-5.  After careful consideration of his options, he was advised regarding surgical decompression and stabilization ia an anterior diskectomy and arthrodesis.  He underwent this procedure on January 15, 2000 and underwent plate fixation.  He tolerated the procedure well. e had had some swallowing difficulty and nausea during the first 24 hours; however, this resolved with time.  DISCHARGE MEDICATIONS:  He is discharged home at this time with a prescription or Vicodin #40 without refills and Xanax 0.5 mg #30 without refills.  FOLLOW-UP:  He will be seen in the office in two to three weeks time for further follow-up.  DISPOSITION:  His incision is clean and dry.  He is ambulatory.  He has some very modest difficulty with swallowing of solids but is otherwise tolerating nearly normal activities. DD:  01/17/00 TD:  01/18/00 Job: [id number]

## 2011-04-02 NOTE — Op Note (Signed)
NAME:  Bob Elliott, Bob Elliott NO.:  [id number]   MEDICAL RECORD NO.:  [id number]          PATIENT TYPE:  AMB   LOCATION:  ENDO                         FACILITY:  [facility]   PHYSICIAN:  Danise Edge, M.D.   DATE OF BIRTH:  [birth year]   DATE OF PROCEDURE:  03/29/2005  DATE OF DISCHARGE:                                 OPERATIVE REPORT   PROCEDURE:  Esophagogastroduodenoscopy.   INDICATIONS:  Mr. Tevyn Codd is a 70 year old male born Aug 03, 1941. Mr.  Dorner has chronic gastroesophageal reflux associated with abdominal  bloating and intermittent both solid and liquid dysphagia.   ENDOSCOPIST:  Danise Edge, M.D.   PREMEDICATION:  Versed 10 mg, Demerol 50 mg.   DESCRIPTION OF PROCEDURE:  After obtaining informed consent, Mr. Mineo was  placed in the left lateral decubitus position. I administered intravenous  Demerol and intravenous Versed to achieve conscious sedation for the  procedure. The patient's blood pressure, oxygen saturation and cardiac  rhythm were monitored throughout the procedure and documented in the medical  record.   The Olympus gastroscope was passed through the posterior hypopharynx into  the proximal esophagus without difficulty. The hypopharynx, larynx and vocal  cords appeared normal.   ESOPHAGOSCOPY:  The proximal, mid and lower segments of the esophageal  mucosa appeared normal. The squamocolumnar junction is noted at 40 cm from  the incisor teeth. There is no endoscopic evidence for the presence of  erosive esophagitis, esophageal stricture formation or Barrett's esophagus.   GASTROSCOPY:  Mr. Grunewald has a small hiatal hernia. Retroflex view of the  gastric cardia and fundus was normal. There are scattered 1 mm - 2 mm benign-  appearing gastric polyps in the gastric body which were biopsied. All the  polyps were not removed. The gastric antrum and pylorus appeared normal.   DUODENOSCOPY:  The duodenal bulb and descending duodenum  appeared normal.   ASSESSMENT:  1.  Gastroesophageal reflux associated with a small hiatal hernia but no      erosive esophagitis, Barrett's esophagus or esophageal stricture      formation.  2.  A few scattered small (1 mm - 2 mm) gastric body polyps biopsied.      MJ/MEDQ  D:  03/29/2005  T:  03/29/2005  Job:  [id number]   cc:   Georgann Housekeeper, MD  [address]  [place]  [place] [address]  Fax: [phone]

## 2011-04-02 NOTE — Op Note (Signed)
   NAME:  Bob Elliott, Bob Elliott                           ACCOUNT NO.:  [id number]   MEDICAL RECORD NO.:  [id number]                   PATIENT TYPE:  AMB   LOCATION:  ENDO                                 FACILITY:  [facility]   PHYSICIAN:  Danise Edge, M.D.                DATE OF BIRTH:  [birth year]   DATE OF PROCEDURE:  02/18/2003  DATE OF DISCHARGE:                                 OPERATIVE REPORT   PROCEDURE:  Screening colonoscopy.   INDICATIONS FOR PROCEDURE:  [name] is a 70 year old male born  Feb 06, 1941. [name] is scheduled to undergo his first screening  colonoscopy with polypectomy to prevent colon cancer. His brother died at  age 55 of colon cancer.   ENDOSCOPIST:  Charolett Bumpers, M.D.   PREMEDICATION:  Versed 10 mg, Demerol 50 mg .   ENDOSCOPE:  Olympus adult colonoscope.   DESCRIPTION OF PROCEDURE:  After obtaining informed consent, [name] was  placed in the left lateral decubitus position. I administered intravenous  Demerol and intravenous Versed to achieve conscious sedation for the  procedure. The patient's blood pressure, oxygen saturation and cardiac  rhythm were monitored throughout the procedure and documented in the medical  record.   Anal inspection was normal. Digital rectal exam was normal. The Olympus  colonoscope was introduced into the rectum and easily advanced to the cecum.  A normal appearing ileocecal valve was intubated and the distal ileum  inspected.  Colonic preparation for the exam today was excellent.   RECTUM:  Normal.   SIGMOID COLON AND DESCENDING COLON:  Normal.   SPLENIC FLEXURE:  Normal.   TRANSVERSE COLON:  Normal.   HEPATIC FLEXURE:  Normal.   ASCENDING COLON:  Normal.   CECUM AND ILEOCECAL VALVE:  Normal.   DISTAL ILEUM:  Normal.    ASSESSMENT:  Normal screening proctocolonoscopy to the cecum.   RECOMMENDATIONS:  Repeat colonoscopy in five years.     Danise Edge, M.D.    MJ/MEDQ  D:  02/18/2003  T:  02/18/2003  Job:  [id number]   cc:   Georgann Housekeeper, M.D.  [address]  [place]  [place] [address]  Fax: [phone]

## 2011-04-02 NOTE — Op Note (Signed)
[place]. [facility]  Patient:    Bob Elliott, Bob Elliott                          MRN: [id number] Proc. Date: 01/15/00 Adm. Date:  [id number] Disc. Date: [id number] Attending:  Coletta Memos                           Operative Report  PREOPERATIVE DIAGNOSIS:  Cervical spondylosis, C4-5, 5-6, 6-7.  POSTOPERATIVE DIAGNOSIS:  Cervical spondylosis, C4-5, 5-6, 6-7.  OPERATION PERFORMED:  Anterior cervical diskectomy, C4-5, 5-6, 6-7. Arthrodesis C4-5, 5-6, 6-7 using a 54 mm Synthes ____________ small stature plate and 7 mm allograft, 9 mm iliac crest wedge and 9 mm iliac crest wedge which were fashioned to fit.  SURGEON:  Kyle L. Franky Macho, M.D.  ANESTHESIA:  INDICATIONS FOR PROCEDURE:  Mr. Evaan Tidwell is a 70 year old gentleman who presented for evaluation of pain in the left upper extremity which he had for a six year period.  MRI scan showed two disks, one at C5-6, the other at C6-7 both on the left side, obviously compressing the C6 and the C7 nerve roots. He had anterior spurring and posterior spurring at C4-5 and a narrowed disk at C5-6 and at C4-5.  He had significant pain located in the shoulder going into the arm and hand along with some mild deltoid weakness.  I therefore proposed and he agreed to an anterior cervical diskectomy and fusion at 4-5, 5-6 and 6-7.  DESCRIPTION OF PROCEDURE:  The patient was brought to the operating room intubated, placed under general anesthesia without difficulty.  A Foley catheter was placed under sterile conditions.  His head was placed in 10 pounds of cervical traction and slight extension on a cerebellar head rest on a horseshoe head rest.  His neck was prepped and he was draped in a sterile fashion.  I made a longitudinal incision along the medial border of the left sternocleidomastoid.  I controlled bleeding in the subcutaneous tissues with bipolar cautery.  I dissected using Metzenbaum scissors through the platysma and  opened that in the same direction as the incision longitudinally. Retracted the skin edges and with blunt dissection was able to expose the anterior cervical spine and retract the medial strap muscles and esophagus medially and then the carotid sheath and sternocleidomastoid laterally. Reflected the longus coli muscles and placed self-retaining retractors.  I then proceeded with the diskectomy first at C5-6 after placing distraction pins in C5 and the the other in C6 and distracting the disk space.  Opened that disk space with a #15 blade after localizing with an x-ray to confirm that I was at the level that I expected.  X-ray confirmed that I was at C4-5. I opened C5-6 and proceeded with the diskectomy there.  Brought the microscope into position and using curets and pituitaries removed disk and end plate from the vertebral bodies.  Using Kerrison punches along with the drill, removed osteophytes to decompress the C6 nerve roots on both the right and left sides. After adequate decompression, placed Gelfoam into the cavity, reduced my distraction and removed the pin and proceeded to turn my attention to C6-7.  I again removed the disk in a progressive fashion using a 15 blade, pituitary rongeurs, curets, Kerrison punches.  After the disk and end plates were removed, the posterior longitudinal ligament was identified.  It was opened and also removed to expose the  thecal sac at that level.  The C7 nerve roots were then decompressed on both the right and left sides.  After that was complete, placed Gelfoam in the wound, turned my attention to C4-5 again placed my distraction pins, one at C4, the other in C5.  Distracted the disk space, opened the disk with a #15 blade.  Removed the disk in a progressive fashion using a curet drill and Kerrison punches along with pituitary rongeurs.  Exposed the posterior longitudinal ligament, opened the posterior longitudinal ligament, removed the osteophytes  and decompressed the nerve roots and the thecal sac.  At all levels the posterior longitudinal ligament was opened and at all levels, the nerve roots decompressed.  I irrigated the wound.  Then placed 7 mm allograft at C5-6 and then fashioned two 9 mm grafts to be placed at C4-5 and at C6-7.  Then placed anterior cervical plate 54 mm over C4, C5, C6, C7 bodies placing two screws, placing five screws in total and also placed one 4.35 screws.  Placed locking screws.  Postoperative x-ray showed the plate and plug to be in good position.  Irrigated the wound.  Then closed the wound in layered fashion, reapproximating the platysma with Vicryl sutures and the subcutaneous tissues with Vicryl sutures.  Skin reapproximated with Steri-Strips.  Sterile dressing was applied.  The weight cervical traction had been removed prior to placing the plate.  The patient then was extubated, awakened and moving all extremities postoperatively. DD:  03/09/00 TD:  03/10/00 Job: [id number]

## 2011-08-10 LAB — BASIC METABOLIC PANEL
BUN: 6
CO2: 34 — ABNORMAL HIGH
Calcium: 8.9
Chloride: 105
Creatinine, Ser: 1.14
GFR calc Af Amer: >60
GFR calc non Af Amer: >60
Glucose, Bld: 95
Potassium: 3.2 — ABNORMAL LOW
Sodium: 143

## 2011-08-10 LAB — POCT CARDIAC MARKERS
CKMB, poc: <1 — ABNORMAL LOW
Myoglobin, poc: 63
Operator id: 133351
Troponin i, poc: <0.05

## 2011-08-10 LAB — POCT I-STAT, CHEM 8
BUN: 16
Calcium, Ion: 1.04 — ABNORMAL LOW
Chloride: 103
Creatinine, Ser: 1.3
Glucose, Bld: 105 — ABNORMAL HIGH
HCT: 46
Hemoglobin: 15.6
Potassium: 3.3 — ABNORMAL LOW
Sodium: 140
TCO2: 26

## 2011-08-10 LAB — CBC
HCT: 44.4
Hemoglobin: 15
MCHC: 33.9
MCV: 89.3
Platelets: 199
RBC: 4.97
RDW: 13.4
WBC: 6.5

## 2011-08-10 LAB — LIPID PANEL
Cholesterol: 179
HDL: 37 — ABNORMAL LOW
LDL Cholesterol: 115 — ABNORMAL HIGH
Total CHOL/HDL Ratio: 4.8
Triglycerides: 136
VLDL: 27

## 2011-08-10 LAB — DIFFERENTIAL
Basophils Absolute: 0
Basophils Relative: 1
Eosinophils Absolute: 0.1
Eosinophils Relative: 1
Lymphocytes Relative: 25
Lymphs Abs: 1.6
Monocytes Absolute: 0.6
Monocytes Relative: 10
Neutro Abs: 4.1
Neutrophils Relative %: 63

## 2011-08-10 LAB — CK TOTAL AND CKMB (NOT AT ARMC)
CK, MB: 1.3
Relative Index: 1
Total CK: 126

## 2011-08-10 LAB — CARDIAC PANEL(CRET KIN+CKTOT+MB+TROPI)
CK, MB: 1.2
Relative Index: 1.1
Total CK: 108
Troponin I: 0.02

## 2011-08-10 LAB — TROPONIN I: Troponin I: 0.03

## 2011-08-10 LAB — HEMOGLOBIN AND HEMATOCRIT, BLOOD
HCT: 44.5
Hemoglobin: 15.3

## 2011-08-10 LAB — D-DIMER, QUANTITATIVE (NOT AT ARMC): D-Dimer, Quant: 0.6 — ABNORMAL HIGH

## 2012-02-02 DIAGNOSIS — Z0000 Encounter for general adult medical examination without abnormal findings: Secondary | ICD-10-CM | POA: Diagnosis not present

## 2012-02-02 DIAGNOSIS — N400 Enlarged prostate without lower urinary tract symptoms: Secondary | ICD-10-CM | POA: Diagnosis not present

## 2012-02-02 DIAGNOSIS — I10 Essential (primary) hypertension: Secondary | ICD-10-CM | POA: Diagnosis not present

## 2012-02-02 DIAGNOSIS — E782 Mixed hyperlipidemia: Secondary | ICD-10-CM | POA: Diagnosis not present

## 2012-02-02 DIAGNOSIS — R5383 Other fatigue: Secondary | ICD-10-CM | POA: Diagnosis not present

## 2012-02-02 DIAGNOSIS — Z79899 Other long term (current) drug therapy: Secondary | ICD-10-CM | POA: Diagnosis not present

## 2012-02-02 DIAGNOSIS — Z1331 Encounter for screening for depression: Secondary | ICD-10-CM | POA: Diagnosis not present

## 2012-02-02 DIAGNOSIS — R5381 Other malaise: Secondary | ICD-10-CM | POA: Diagnosis not present

## 2012-02-02 DIAGNOSIS — K219 Gastro-esophageal reflux disease without esophagitis: Secondary | ICD-10-CM | POA: Diagnosis not present

## 2012-02-07 DIAGNOSIS — N400 Enlarged prostate without lower urinary tract symptoms: Secondary | ICD-10-CM | POA: Diagnosis not present

## 2012-02-07 DIAGNOSIS — K219 Gastro-esophageal reflux disease without esophagitis: Secondary | ICD-10-CM | POA: Diagnosis not present

## 2012-02-07 DIAGNOSIS — I10 Essential (primary) hypertension: Secondary | ICD-10-CM | POA: Diagnosis not present

## 2012-02-07 DIAGNOSIS — J309 Allergic rhinitis, unspecified: Secondary | ICD-10-CM | POA: Diagnosis not present

## 2012-02-07 DIAGNOSIS — N182 Chronic kidney disease, stage 2 (mild): Secondary | ICD-10-CM | POA: Diagnosis not present

## 2012-03-13 DIAGNOSIS — N182 Chronic kidney disease, stage 2 (mild): Secondary | ICD-10-CM | POA: Diagnosis not present

## 2012-03-17 DIAGNOSIS — H2510 Age-related nuclear cataract, unspecified eye: Secondary | ICD-10-CM | POA: Diagnosis not present

## 2012-03-17 DIAGNOSIS — H40019 Open angle with borderline findings, low risk, unspecified eye: Secondary | ICD-10-CM | POA: Diagnosis not present

## 2012-03-17 DIAGNOSIS — H40059 Ocular hypertension, unspecified eye: Secondary | ICD-10-CM | POA: Diagnosis not present

## 2012-03-30 DIAGNOSIS — IMO0002 Reserved for concepts with insufficient information to code with codable children: Secondary | ICD-10-CM | POA: Diagnosis not present

## 2012-03-30 DIAGNOSIS — H2510 Age-related nuclear cataract, unspecified eye: Secondary | ICD-10-CM | POA: Diagnosis not present

## 2012-05-09 DIAGNOSIS — R079 Chest pain, unspecified: Secondary | ICD-10-CM | POA: Diagnosis not present

## 2012-05-12 DIAGNOSIS — R079 Chest pain, unspecified: Secondary | ICD-10-CM | POA: Diagnosis not present

## 2012-05-29 DIAGNOSIS — K224 Dyskinesia of esophagus: Secondary | ICD-10-CM | POA: Diagnosis not present

## 2012-05-29 DIAGNOSIS — K219 Gastro-esophageal reflux disease without esophagitis: Secondary | ICD-10-CM | POA: Diagnosis not present

## 2012-06-12 DIAGNOSIS — K219 Gastro-esophageal reflux disease without esophagitis: Secondary | ICD-10-CM | POA: Diagnosis not present

## 2012-06-12 DIAGNOSIS — R252 Cramp and spasm: Secondary | ICD-10-CM | POA: Diagnosis not present

## 2012-06-12 DIAGNOSIS — K224 Dyskinesia of esophagus: Secondary | ICD-10-CM | POA: Diagnosis not present

## 2012-08-03 DIAGNOSIS — E669 Obesity, unspecified: Secondary | ICD-10-CM | POA: Diagnosis not present

## 2012-08-03 DIAGNOSIS — R7309 Other abnormal glucose: Secondary | ICD-10-CM | POA: Diagnosis not present

## 2012-08-03 DIAGNOSIS — K219 Gastro-esophageal reflux disease without esophagitis: Secondary | ICD-10-CM | POA: Diagnosis not present

## 2012-08-03 DIAGNOSIS — I10 Essential (primary) hypertension: Secondary | ICD-10-CM | POA: Diagnosis not present

## 2012-08-03 DIAGNOSIS — M5412 Radiculopathy, cervical region: Secondary | ICD-10-CM | POA: Diagnosis not present

## 2012-08-07 DIAGNOSIS — M25519 Pain in unspecified shoulder: Secondary | ICD-10-CM | POA: Diagnosis not present

## 2012-08-07 DIAGNOSIS — M545 Low back pain, unspecified: Secondary | ICD-10-CM | POA: Diagnosis not present

## 2012-08-09 DIAGNOSIS — R7309 Other abnormal glucose: Secondary | ICD-10-CM | POA: Diagnosis not present

## 2012-08-21 DIAGNOSIS — Z23 Encounter for immunization: Secondary | ICD-10-CM | POA: Diagnosis not present

## 2012-08-23 DIAGNOSIS — H43819 Vitreous degeneration, unspecified eye: Secondary | ICD-10-CM | POA: Diagnosis not present

## 2012-08-24 DIAGNOSIS — H43819 Vitreous degeneration, unspecified eye: Secondary | ICD-10-CM | POA: Diagnosis not present

## 2012-08-29 DIAGNOSIS — H43819 Vitreous degeneration, unspecified eye: Secondary | ICD-10-CM | POA: Diagnosis not present

## 2012-09-12 DIAGNOSIS — H35419 Lattice degeneration of retina, unspecified eye: Secondary | ICD-10-CM | POA: Diagnosis not present

## 2012-10-25 DIAGNOSIS — H40059 Ocular hypertension, unspecified eye: Secondary | ICD-10-CM | POA: Diagnosis not present

## 2013-02-01 DIAGNOSIS — J309 Allergic rhinitis, unspecified: Secondary | ICD-10-CM | POA: Diagnosis not present

## 2013-02-01 DIAGNOSIS — Z1331 Encounter for screening for depression: Secondary | ICD-10-CM | POA: Diagnosis not present

## 2013-02-01 DIAGNOSIS — Z0000 Encounter for general adult medical examination without abnormal findings: Secondary | ICD-10-CM | POA: Diagnosis not present

## 2013-02-01 DIAGNOSIS — N183 Chronic kidney disease, stage 3 unspecified: Secondary | ICD-10-CM | POA: Diagnosis not present

## 2013-02-01 DIAGNOSIS — N400 Enlarged prostate without lower urinary tract symptoms: Secondary | ICD-10-CM | POA: Diagnosis not present

## 2013-02-01 DIAGNOSIS — K219 Gastro-esophageal reflux disease without esophagitis: Secondary | ICD-10-CM | POA: Diagnosis not present

## 2013-02-01 DIAGNOSIS — M109 Gout, unspecified: Secondary | ICD-10-CM | POA: Diagnosis not present

## 2013-02-01 DIAGNOSIS — R7309 Other abnormal glucose: Secondary | ICD-10-CM | POA: Diagnosis not present

## 2013-02-01 DIAGNOSIS — Z23 Encounter for immunization: Secondary | ICD-10-CM | POA: Diagnosis not present

## 2013-02-01 DIAGNOSIS — I10 Essential (primary) hypertension: Secondary | ICD-10-CM | POA: Diagnosis not present

## 2013-05-03 DIAGNOSIS — H40059 Ocular hypertension, unspecified eye: Secondary | ICD-10-CM | POA: Diagnosis not present

## 2013-05-03 DIAGNOSIS — Z961 Presence of intraocular lens: Secondary | ICD-10-CM | POA: Diagnosis not present

## 2013-05-03 DIAGNOSIS — H35419 Lattice degeneration of retina, unspecified eye: Secondary | ICD-10-CM | POA: Diagnosis not present

## 2013-07-02 DIAGNOSIS — M542 Cervicalgia: Secondary | ICD-10-CM | POA: Diagnosis not present

## 2013-07-02 DIAGNOSIS — M79609 Pain in unspecified limb: Secondary | ICD-10-CM | POA: Diagnosis not present

## 2013-08-02 DIAGNOSIS — I10 Essential (primary) hypertension: Secondary | ICD-10-CM | POA: Diagnosis not present

## 2013-08-02 DIAGNOSIS — Z23 Encounter for immunization: Secondary | ICD-10-CM | POA: Diagnosis not present

## 2013-08-02 DIAGNOSIS — J309 Allergic rhinitis, unspecified: Secondary | ICD-10-CM | POA: Diagnosis not present

## 2013-08-02 DIAGNOSIS — K219 Gastro-esophageal reflux disease without esophagitis: Secondary | ICD-10-CM | POA: Diagnosis not present

## 2013-08-17 DIAGNOSIS — Z23 Encounter for immunization: Secondary | ICD-10-CM | POA: Diagnosis not present

## 2013-10-29 DIAGNOSIS — H40059 Ocular hypertension, unspecified eye: Secondary | ICD-10-CM | POA: Diagnosis not present

## 2013-10-29 DIAGNOSIS — H40019 Open angle with borderline findings, low risk, unspecified eye: Secondary | ICD-10-CM | POA: Diagnosis not present

## 2013-11-21 DIAGNOSIS — R350 Frequency of micturition: Secondary | ICD-10-CM | POA: Diagnosis not present

## 2013-11-21 DIAGNOSIS — N400 Enlarged prostate without lower urinary tract symptoms: Secondary | ICD-10-CM | POA: Diagnosis not present

## 2013-12-10 DIAGNOSIS — D485 Neoplasm of uncertain behavior of skin: Secondary | ICD-10-CM | POA: Diagnosis not present

## 2014-02-04 DIAGNOSIS — R7309 Other abnormal glucose: Secondary | ICD-10-CM | POA: Diagnosis not present

## 2014-02-04 DIAGNOSIS — Z0000 Encounter for general adult medical examination without abnormal findings: Secondary | ICD-10-CM | POA: Diagnosis not present

## 2014-02-04 DIAGNOSIS — K219 Gastro-esophageal reflux disease without esophagitis: Secondary | ICD-10-CM | POA: Diagnosis not present

## 2014-02-04 DIAGNOSIS — N400 Enlarged prostate without lower urinary tract symptoms: Secondary | ICD-10-CM | POA: Diagnosis not present

## 2014-02-04 DIAGNOSIS — N183 Chronic kidney disease, stage 3 unspecified: Secondary | ICD-10-CM | POA: Diagnosis not present

## 2014-02-04 DIAGNOSIS — I10 Essential (primary) hypertension: Secondary | ICD-10-CM | POA: Diagnosis not present

## 2014-02-04 DIAGNOSIS — Z1331 Encounter for screening for depression: Secondary | ICD-10-CM | POA: Diagnosis not present

## 2014-02-04 DIAGNOSIS — Z23 Encounter for immunization: Secondary | ICD-10-CM | POA: Diagnosis not present

## 2014-02-04 DIAGNOSIS — E782 Mixed hyperlipidemia: Secondary | ICD-10-CM | POA: Diagnosis not present

## 2014-04-24 ENCOUNTER — Other Ambulatory Visit: Payer: Self-pay | Admitting: Internal Medicine

## 2014-04-24 ENCOUNTER — Ambulatory Visit
Admission: RE | Admit: 2014-04-24 | Discharge: 2014-04-24 | Disposition: A | Discharge status: Home / Self Care | Payer: Medicare Other | Source: Ambulatory Visit | Attending: Internal Medicine | Admitting: Internal Medicine

## 2014-04-24 DIAGNOSIS — M79631 Pain in right forearm: Secondary | ICD-10-CM

## 2014-04-24 DIAGNOSIS — M79609 Pain in unspecified limb: Secondary | ICD-10-CM | POA: Diagnosis not present

## 2014-04-24 DIAGNOSIS — M19039 Primary osteoarthritis, unspecified wrist: Secondary | ICD-10-CM | POA: Diagnosis not present

## 2014-04-24 DIAGNOSIS — K4090 Unilateral inguinal hernia, without obstruction or gangrene, not specified as recurrent: Secondary | ICD-10-CM | POA: Diagnosis not present

## 2014-04-25 ENCOUNTER — Other Ambulatory Visit: Payer: Self-pay | Admitting: Internal Medicine

## 2014-04-25 DIAGNOSIS — M79601 Pain in right arm: Secondary | ICD-10-CM

## 2014-04-29 ENCOUNTER — Ambulatory Visit
Admission: RE | Admit: 2014-04-29 | Discharge: 2014-04-29 | Disposition: A | Discharge status: Home / Self Care | Payer: Medicare Other | Source: Ambulatory Visit | Attending: Internal Medicine | Admitting: Internal Medicine

## 2014-04-29 DIAGNOSIS — H40059 Ocular hypertension, unspecified eye: Secondary | ICD-10-CM | POA: Diagnosis not present

## 2014-04-29 DIAGNOSIS — M79601 Pain in right arm: Secondary | ICD-10-CM

## 2014-04-29 DIAGNOSIS — H2510 Age-related nuclear cataract, unspecified eye: Secondary | ICD-10-CM | POA: Diagnosis not present

## 2014-04-29 DIAGNOSIS — H40019 Open angle with borderline findings, low risk, unspecified eye: Secondary | ICD-10-CM | POA: Diagnosis not present

## 2014-04-29 DIAGNOSIS — M899 Disorder of bone, unspecified: Secondary | ICD-10-CM | POA: Diagnosis not present

## 2014-04-29 DIAGNOSIS — M949 Disorder of cartilage, unspecified: Secondary | ICD-10-CM | POA: Diagnosis not present

## 2014-04-29 DIAGNOSIS — Z961 Presence of intraocular lens: Secondary | ICD-10-CM | POA: Diagnosis not present

## 2014-05-21 ENCOUNTER — Ambulatory Visit (INDEPENDENT_AMBULATORY_CARE_PROVIDER_SITE_OTHER): Payer: Medicare Other | Admitting: General Surgery

## 2014-05-21 ENCOUNTER — Encounter (INDEPENDENT_AMBULATORY_CARE_PROVIDER_SITE_OTHER): Payer: Self-pay | Admitting: General Surgery

## 2014-05-21 VITALS — BP 122/70 | HR 58 | Temp 97.9°F | Resp 12 | Ht 68.0 in | Wt 202.8 lb

## 2014-05-21 DIAGNOSIS — K4090 Unilateral inguinal hernia, without obstruction or gangrene, not specified as recurrent: Secondary | ICD-10-CM

## 2014-05-21 HISTORY — DX: Unilateral inguinal hernia, without obstruction or gangrene, not specified as recurrent: K40.90

## 2014-05-21 NOTE — Progress Notes (Signed)
Patient ID: Bob Elliott, male   DOB: [birth year], 73 y.o.   MRN: [id number]  Chief Complaint  Patient presents with  . Inguinal Hernia    HPI Bob Elliott is a 73 y.o. male.   HPI  He is referred by Dr. Lysle Rubens because of a right inguinal hernia.  About 2 months ago, he stepped in a hole and noted some pain in the right groin. Since that time, he is noted a swelling particularly when he passes some gas. There is some mild discomfort. No obstructive symptoms. He has had an umbilical hernia repair in the past. No difficulty with urination.  Past Medical History  Diagnosis Date  . Hypertension   . Arthritis     Past Surgical History  Procedure Laterality Date  . Neck surgery  2000    Family History  Problem Relation Age of Onset  . Heart disease Mother   . Stroke Father   . Cancer Brother     colon    Social History History  Substance Use Topics  . Smoking status: Never Smoker   . Smokeless tobacco: Not on file  . Alcohol Use: No    Allergies  Allergen Reactions  . Aspirin Hives, Itching and Nausea Only  . Codeine Hives, Itching and Nausea Only    Current Outpatient Prescriptions  Medication Sig Dispense Refill  . diltiazem (DILACOR XR) 180 MG 24 hr capsule       . doxazosin (CARDURA) 4 MG tablet       . esomeprazole (NEXIUM) 40 MG capsule Take 40 mg by mouth daily at 12 noon.      . fluticasone (FLONASE) 50 MCG/ACT nasal spray       . potassium chloride (MICRO-K) 10 MEQ CR capsule       . timolol (TIMOPTIC-XR) 0.5 % ophthalmic gel-forming       . trandolapril (MAVIK) 4 MG tablet        No current facility-administered medications for this visit.    Review of Systems Review of Systems  Constitutional: Negative.   Respiratory: Negative.   Cardiovascular: Negative.   Gastrointestinal: Negative.   Musculoskeletal: Positive for arthralgias.    Blood pressure 122/70, pulse 58, temperature 97.9 F (36.6 C), resp. rate 12, height 5\' 8"  (1.727 m), weight 202 lb  12.8 oz (91.989 kg).  Physical Exam Physical Exam  Constitutional: No distress.  Overweight male.  HENT:  Head: Normocephalic and atraumatic.  Cardiovascular: Normal rate and regular rhythm.   Pulmonary/Chest: Effort normal.  Abdominal: Soft.  Protuberant.  Subumbilical scar.  Genitourinary:  Moderate size right inguinal bulge that is reducible in the supine position. No left inguinal bulge. No testicular masses.  Musculoskeletal: He exhibits no edema.  Neurological: He is alert.  Skin: Skin is warm and dry.  Psychiatric: He has a normal mood and affect. His behavior is normal.    Data Reviewed None  Assessment    Mildly symptomatic, moderate size right inguinal hernia. We discussed repair and he is interested in this.     Plan    Open repair of right inguinal hernia with mesh.  I have explained the procedure, risks, and aftercare of inguinal hernia repair.  Risks include but are not limited to bleeding, infection, wound problems, anesthesia, recurrence, bladder or intestine injury, urinary retention, testicular dysfunction, chronic pain, mesh problems.  He seems to understand and agrees to proceed.        Ifeanyi Mickelson J 05/21/2014, 4:42 PM

## 2014-05-21 NOTE — Patient Instructions (Signed)
My office will call you to discuss scheduling your hernia surgery.   CC[facility] _______[facility]   INGUINAL HERNIA REPAIR: POST OP INSTRUCTIONS  Always review your discharge instruction sheet given to you by the facility where your surgery was performed. IF YOU HAVE DISABILITY OR FAMILY LEAVE FORMS, YOU MUST BRING THEM TO THE OFFICE FOR PROCESSING.   DO NOT GIVE THEM TO YOUR DOCTOR.  1. A  prescription for pain medication may be given to you upon discharge.  Take your pain medication as prescribed, if needed.  If narcotic pain medicine is not needed, then you may take acetaminophen (Tylenol) or ibuprofen (Advil) as needed. 2. Take your usually prescribed medications unless otherwise directed. 3. If you need a refill on your pain medication, please contact your pharmacy.  They will contact our office to request authorization. Prescriptions will not be filled after 5 pm or on week-ends. 4. You should follow a light diet the first 24 hours after arrival home, such as soup and crackers, etc.  Be sure to include lots of fluids daily.  Resume your normal diet the day after surgery. 5. Most patients will experience some swelling and bruising in the groin and scrotum.  Ice packs and reclining will help.  Swelling and bruising can take several days to resolve.  6. It is common to experience some constipation if taking pain medication after surgery.  Increasing fluid intake and taking a stool softener (such as Colace) will usually help or prevent this problem from occurring.  A mild laxative (Milk of Magnesia or Miralax) should be taken according to package directions if there are no bowel movements after 48 hours. 7. Unless discharge instructions indicate otherwise, you may remove your bandages 24-48 hours after surgery, and you may shower at that time.  You may have steri-strips (small skin tapes) in place directly over the incision.  These strips should be left on the skin for 7-10 days.  If  your surgeon used skin glue on the incision, you may shower in 24 hours.  The glue will flake off over the next 2-3 weeks.  Any sutures or staples will be removed at the office during your follow-up visit. 8. ACTIVITIES:  You may resume regular (light) daily activities beginning the next day-such as daily self-care, walking, climbing stairs-gradually increasing activities as tolerated.  You may have sexual intercourse when it is comfortable.  Refrain from any heavy lifting or straining until approved by your doctor. a. You may drive when you are no longer taking prescription pain medication, you can comfortably wear a seatbelt, and you can safely maneuver your car and apply brakes. b. RETURN TO WORK:  __________________________________________________________ 9. You should see your doctor in the office for a follow-up appointment approximately 2-3 weeks after your surgery.  Make sure that you call for this appointment within a day or two after you arrive home to insure a convenient appointment time. 10. OTHER INSTRUCTIONS:  __________________________________________________________________________________________________________________________________________________________________________________________  WHEN TO CALL YOUR DOCTOR: 1. Fever over 101.0 2. Inability to urinate 3. Nausea and/or vomiting 4. Extreme swelling or bruising 5. Continued bleeding from incision. 6. Increased pain, redness, or drainage from the incision  The clinic staff is available to answer your questions during regular business hours.  Please don't hesitate to call and ask to speak to one of the nurses for clinical concerns.  If you have a medical emergency, go to the nearest emergency room or call 911.  A surgeon from [facility] is  always on call at the hospital   [address], [place], [place], [place]  [address] ?  [address], [place], [place]   [address] [phone] ? [phone] ? FAX  [phone] Web site: [email or web address]

## 2014-05-23 DIAGNOSIS — E876 Hypokalemia: Secondary | ICD-10-CM | POA: Diagnosis not present

## 2014-05-23 DIAGNOSIS — M79609 Pain in unspecified limb: Secondary | ICD-10-CM | POA: Diagnosis not present

## 2014-05-25 DIAGNOSIS — M5412 Radiculopathy, cervical region: Secondary | ICD-10-CM | POA: Diagnosis not present

## 2014-06-05 ENCOUNTER — Encounter (HOSPITAL_COMMUNITY): Payer: Self-pay | Admitting: Pharmacy Technician

## 2014-06-06 NOTE — Pre-Procedure Instructions (Signed)
Bob Elliott  06/06/2014   Your procedure is scheduled on:  Tuesday, August 4th  Report to [facility] Admitting at 0530 AM.  Call this number if you have problems the morning of surgery: [phone]   Remember:   Do not eat food or drink liquids after midnight.   Take these medicines the morning of surgery with A SIP OF WATER: diltiazem, cardura, zyrtec, nexium, eye drops, nasal spray   Do not wear jewelry.  Do not wear lotions, powders, or perfumes. You may wear deodorant.  Do not shave 48 hours prior to surgery. Men may shave face and neck.  Do not bring valuables to the hospital.  [facility] is not responsible  for any belongings or valuables.               Contacts, dentures or bridgework may not be worn into surgery.  Leave suitcase in the car. After surgery it may be brought to your room.  For patients admitted to the hospital, discharge time is determined by your treatment team.               Patients discharged the day of surgery will not be allowed to drive home.  Please read over the following fact sheets that you were given: Pain Booklet, Coughing and Deep Breathing and Surgical Site Infection Prevention [place] - Preparing for Surgery  Before surgery, you can play an important role.  Because skin is not sterile, your skin needs to be as free of germs as possible.  You can reduce the number of germs on you skin by washing with CHG (chlorahexidine gluconate) soap before surgery.  CHG is an antiseptic cleaner which kills germs and bonds with the skin to continue killing germs even after washing.  Please DO NOT use if you have an allergy to CHG or antibacterial soaps.  If your skin becomes reddened/irritated stop using the CHG and inform your nurse when you arrive at Short Stay.  Do not shave (including legs and underarms) for at least 48 hours prior to the first CHG shower.  You may shave your face.  Please follow these instructions carefully:   1.  Shower  with CHG Soap the night before surgery and the morning of Surgery.  2.  If you choose to wash your hair, wash your hair first as usual with your normal shampoo.  3.  After you shampoo, rinse your hair and body thoroughly to remove the shampoo.  4.  Use CHG as you would any other liquid soap.  You can apply CHG directly to the skin and wash gently with scrungie or a clean washcloth.  5.  Apply the CHG Soap to your body ONLY FROM THE NECK DOWN.  Do not use on open wounds or open sores.  Avoid contact with your eyes, ears, mouth and genitals (private parts).  Wash genitals (private parts) with your normal soap.  6.  Wash thoroughly, paying special attention to the area where your surgery will be performed.  7.  Thoroughly rinse your body with warm water from the neck down.  8.  DO NOT shower/wash with your normal soap after using and rinsing off the CHG Soap.  9.  Pat yourself dry with a clean towel.            10.  Wear clean pajamas.            11.  Place clean sheets on your bed the night of your  first shower and do not sleep with pets.  Day of Surgery  Do not apply any lotions/deoderants the morning of surgery.  Please wear clean clothes to the hospital/surgery center.

## 2014-06-07 ENCOUNTER — Encounter (HOSPITAL_COMMUNITY): Payer: Self-pay

## 2014-06-07 ENCOUNTER — Encounter (HOSPITAL_COMMUNITY)
Admission: RE | Admit: 2014-06-07 | Discharge: 2014-06-07 | Disposition: A | Discharge status: Home / Self Care | Payer: Medicare Other | Source: Ambulatory Visit | Attending: Anesthesiology | Admitting: Anesthesiology

## 2014-06-07 ENCOUNTER — Encounter (HOSPITAL_COMMUNITY)
Admission: RE | Admit: 2014-06-07 | Discharge: 2014-06-07 | Disposition: A | Discharge status: Home / Self Care | Payer: Medicare Other | Source: Ambulatory Visit | Attending: General Surgery | Admitting: General Surgery

## 2014-06-07 DIAGNOSIS — I517 Cardiomegaly: Secondary | ICD-10-CM | POA: Insufficient documentation

## 2014-06-07 DIAGNOSIS — M129 Arthropathy, unspecified: Secondary | ICD-10-CM | POA: Diagnosis not present

## 2014-06-07 DIAGNOSIS — Z01810 Encounter for preprocedural cardiovascular examination: Secondary | ICD-10-CM | POA: Diagnosis not present

## 2014-06-07 DIAGNOSIS — I10 Essential (primary) hypertension: Secondary | ICD-10-CM | POA: Insufficient documentation

## 2014-06-07 DIAGNOSIS — I440 Atrioventricular block, first degree: Secondary | ICD-10-CM | POA: Diagnosis not present

## 2014-06-07 DIAGNOSIS — Z01818 Encounter for other preprocedural examination: Secondary | ICD-10-CM | POA: Diagnosis not present

## 2014-06-07 DIAGNOSIS — Z01812 Encounter for preprocedural laboratory examination: Secondary | ICD-10-CM | POA: Insufficient documentation

## 2014-06-07 DIAGNOSIS — Z886 Allergy status to analgesic agent status: Secondary | ICD-10-CM | POA: Diagnosis not present

## 2014-06-07 DIAGNOSIS — Z79899 Other long term (current) drug therapy: Secondary | ICD-10-CM | POA: Diagnosis not present

## 2014-06-07 DIAGNOSIS — E669 Obesity, unspecified: Secondary | ICD-10-CM | POA: Insufficient documentation

## 2014-06-07 DIAGNOSIS — K4090 Unilateral inguinal hernia, without obstruction or gangrene, not specified as recurrent: Secondary | ICD-10-CM | POA: Diagnosis not present

## 2014-06-07 DIAGNOSIS — Z885 Allergy status to narcotic agent status: Secondary | ICD-10-CM | POA: Diagnosis not present

## 2014-06-07 DIAGNOSIS — G4730 Sleep apnea, unspecified: Secondary | ICD-10-CM | POA: Diagnosis not present

## 2014-06-07 HISTORY — DX: Unspecified glaucoma: H40.9

## 2014-06-07 LAB — CBC
HCT: 44.4 % (ref 39.0–52.0)
Hemoglobin: 15 g/dL (ref 13.0–17.0)
MCH: 30.4 pg (ref 26.0–34.0)
MCHC: 33.8 g/dL (ref 30.0–36.0)
MCV: 90.1 fL (ref 78.0–100.0)
Platelets: 157 10*3/uL (ref 150–400)
RBC: 4.93 MIL/uL (ref 4.22–5.81)
RDW: 13.7 % (ref 11.5–15.5)
WBC: 4.7 10*3/uL (ref 4.0–10.5)

## 2014-06-07 LAB — COMPREHENSIVE METABOLIC PANEL
ALT: 13 U/L (ref 0–53)
AST: 15 U/L (ref 0–37)
Albumin: 3.5 g/dL (ref 3.5–5.2)
Alkaline Phosphatase: 74 U/L (ref 39–117)
Anion gap: 13 (ref 5–15)
BUN: 13 mg/dL (ref 6–23)
CO2: 28 mEq/L (ref 19–32)
Calcium: 8.7 mg/dL (ref 8.4–10.5)
Chloride: 103 mEq/L (ref 96–112)
Creatinine, Ser: 1.06 mg/dL (ref 0.50–1.35)
GFR calc Af Amer: 78 mL/min — ABNORMAL LOW (ref >90)
GFR calc non Af Amer: 67 mL/min — ABNORMAL LOW (ref >90)
Glucose, Bld: 93 mg/dL (ref 70–99)
Potassium: 3.4 mEq/L — ABNORMAL LOW (ref 3.7–5.3)
Sodium: 144 mEq/L (ref 137–147)
Total Bilirubin: 0.6 mg/dL (ref 0.3–1.2)
Total Protein: 6.7 g/dL (ref 6.0–8.3)

## 2014-06-07 NOTE — Progress Notes (Signed)
Back in 2009, pt had some 'chest comfort'...came to the hospital and tests were down.  Patient states is was all d/t his neck.  Finally had neck surgery and has had no more complaints, denies chest discomfort.   Had sleep study done 03/2011.......Marland Kitchenwas negative per patient. Have requested old ekg, lov and cardiolite tests from Dr. Benita Stabile  [phone]

## 2014-06-07 NOTE — Progress Notes (Signed)
06/07/14 0911  OBSTRUCTIVE SLEEP APNEA  Have you ever been diagnosed with sleep apnea through a sleep study? Yes  If yes, do you have and use a CPAP or BPAP machine every night? 0  Do you snore loudly (loud enough to be heard through closed doors)?  1 ("WIFE SAYS --SOMETIMES")  Do you often feel tired, fatigued, or sleepy during the daytime? 0  Has anyone observed you stop breathing during your sleep? 0  Do you have, or are you being treated for high blood pressure? 1  BMI more than 35 kg/m2? 0  Age over 42 years old? 1  Neck circumference greater than 40 cm/16 inches? 1  Gender: 1  Obstructive Sleep Apnea Score 5  Score 4 or greater  Results sent to PCP

## 2014-06-10 NOTE — Progress Notes (Signed)
Anesthesia Chart Review:  Patient is a 73 year old male scheduled for right IHR with mesh on 06/18/14 by Dr. Zella Richer.  History includes HTN, non-smoker, arthritis, glaucoma, tonsillectomy, UHR, right cataract extraction, neck surgery. BMI is consistent with mild obesity. PCP is Dr. [name] with [facility]. 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  EKG on 06/07/14 showed: SB at 51 bpm, first degree AVB, possible LAE, non-specific intra-ventricular conduction delay, cannot rule out anterior infarct (age undetermined). There reversed r wave progression in V2-3 is slightly more pronounced, but overall, I think his EKG appears stable when compared to 05/09/12 (PCP).  CXR on 06/07/14 showed: There is no acute cardiopulmonary abnormality. There is mild stable enlargement of the cardiac silhouette.   Preoperative labs noted.  WBC 4.7.    Overall, I think his EKG is stable for the past two years. He denied chest pain at his PAT visit.  No reported CAD/MI/CHF or DM history.  Further evaluation by his assigned anesthesiologist on the day of surgery, but if no acute changes then I would anticipate that he could proceed as planned.  [facility] Stay Center/Anesthesiology Phone [phone] 06/10/2014 12:35 PM

## 2014-06-17 MED ORDER — CEFAZOLIN SODIUM-DEXTROSE 2-3 GM-% IV SOLR
2.0000 g | INTRAVENOUS | Status: AC
  Administered 2014-06-18: 2 g via INTRAVENOUS
  Filled 2014-06-17: qty 50

## 2014-06-18 ENCOUNTER — Encounter (HOSPITAL_COMMUNITY): Payer: Medicare Other | Admitting: Vascular Surgery

## 2014-06-18 ENCOUNTER — Ambulatory Visit (HOSPITAL_COMMUNITY): Payer: Medicare Other | Admitting: Anesthesiology

## 2014-06-18 ENCOUNTER — Ambulatory Visit (HOSPITAL_COMMUNITY)
Admission: RE | Admit: 2014-06-18 | Discharge: 2014-06-18 | Disposition: A | Discharge status: Home / Self Care | Payer: Medicare Other | Source: Ambulatory Visit | Attending: General Surgery | Admitting: General Surgery

## 2014-06-18 ENCOUNTER — Encounter (HOSPITAL_COMMUNITY)
Admission: RE | Disposition: A | Discharge status: Home / Self Care | Payer: Self-pay | Source: Ambulatory Visit | Attending: General Surgery

## 2014-06-18 ENCOUNTER — Encounter (HOSPITAL_COMMUNITY): Payer: Self-pay | Admitting: *Deleted

## 2014-06-18 DIAGNOSIS — K4090 Unilateral inguinal hernia, without obstruction or gangrene, not specified as recurrent: Secondary | ICD-10-CM | POA: Insufficient documentation

## 2014-06-18 DIAGNOSIS — Z79899 Other long term (current) drug therapy: Secondary | ICD-10-CM | POA: Insufficient documentation

## 2014-06-18 DIAGNOSIS — Z886 Allergy status to analgesic agent status: Secondary | ICD-10-CM | POA: Insufficient documentation

## 2014-06-18 DIAGNOSIS — I10 Essential (primary) hypertension: Secondary | ICD-10-CM | POA: Insufficient documentation

## 2014-06-18 DIAGNOSIS — K4020 Bilateral inguinal hernia, without obstruction or gangrene, not specified as recurrent: Secondary | ICD-10-CM

## 2014-06-18 DIAGNOSIS — M129 Arthropathy, unspecified: Secondary | ICD-10-CM | POA: Diagnosis not present

## 2014-06-18 DIAGNOSIS — Z885 Allergy status to narcotic agent status: Secondary | ICD-10-CM | POA: Insufficient documentation

## 2014-06-18 DIAGNOSIS — G4730 Sleep apnea, unspecified: Secondary | ICD-10-CM | POA: Diagnosis not present

## 2014-06-18 HISTORY — PX: INSERTION OF MESH: SHX5868

## 2014-06-18 HISTORY — PX: INGUINAL HERNIA REPAIR: SHX194

## 2014-06-18 SURGERY — REPAIR, HERNIA, INGUINAL, ADULT
Anesthesia: General | Site: Groin | Laterality: Right

## 2014-06-18 MED ORDER — GLYCOPYRROLATE 0.2 MG/ML IJ SOLN
INTRAMUSCULAR | Status: AC
  Filled 2014-06-18: qty 1

## 2014-06-18 MED ORDER — 0.9 % SODIUM CHLORIDE (POUR BTL) OPTIME
TOPICAL | Status: DC | PRN
  Administered 2014-06-18: 1000 mL

## 2014-06-18 MED ORDER — PHENYLEPHRINE 40 MCG/ML (10ML) SYRINGE FOR IV PUSH (FOR BLOOD PRESSURE SUPPORT)
PREFILLED_SYRINGE | INTRAVENOUS | Status: AC
  Filled 2014-06-18: qty 10

## 2014-06-18 MED ORDER — MIDAZOLAM HCL 2 MG/2ML IJ SOLN
INTRAMUSCULAR | Status: AC
  Filled 2014-06-18: qty 2

## 2014-06-18 MED ORDER — BUPIVACAINE-EPINEPHRINE 0.5% -1:200000 IJ SOLN
INTRAMUSCULAR | Status: DC | PRN
  Administered 2014-06-18: 30 mL

## 2014-06-18 MED ORDER — EPHEDRINE SULFATE 50 MG/ML IJ SOLN
INTRAMUSCULAR | Status: AC
  Filled 2014-06-18: qty 1

## 2014-06-18 MED ORDER — ONDANSETRON HCL 4 MG/2ML IJ SOLN
INTRAMUSCULAR | Status: AC
  Filled 2014-06-18: qty 2

## 2014-06-18 MED ORDER — OXYCODONE HCL 5 MG PO TABS
ORAL_TABLET | ORAL | Status: AC
  Filled 2014-06-18: qty 1

## 2014-06-18 MED ORDER — HYDROMORPHONE HCL PF 1 MG/ML IJ SOLN
INTRAMUSCULAR | Status: AC
  Filled 2014-06-18: qty 1

## 2014-06-18 MED ORDER — BUPIVACAINE-EPINEPHRINE (PF) 0.5% -1:200000 IJ SOLN
INTRAMUSCULAR | Status: AC
  Filled 2014-06-18: qty 30

## 2014-06-18 MED ORDER — OXYCODONE HCL 5 MG PO TABS
5.0000 mg | ORAL_TABLET | ORAL | Status: DC | PRN
Start: 2014-06-18 — End: 2014-07-08

## 2014-06-18 MED ORDER — SUCCINYLCHOLINE CHLORIDE 20 MG/ML IJ SOLN
INTRAMUSCULAR | Status: AC
  Filled 2014-06-18: qty 1

## 2014-06-18 MED ORDER — HYDROMORPHONE HCL PF 1 MG/ML IJ SOLN
0.2500 mg | INTRAMUSCULAR | Status: DC | PRN
  Administered 2014-06-18 (×4): 0.5 mg via INTRAVENOUS

## 2014-06-18 MED ORDER — ONDANSETRON HCL 4 MG/2ML IJ SOLN
INTRAMUSCULAR | Status: DC | PRN
  Administered 2014-06-18: 4 mg via INTRAVENOUS

## 2014-06-18 MED ORDER — LIDOCAINE HCL (CARDIAC) 20 MG/ML IV SOLN
INTRAVENOUS | Status: AC
  Filled 2014-06-18: qty 5

## 2014-06-18 MED ORDER — FENTANYL CITRATE 0.05 MG/ML IJ SOLN
INTRAMUSCULAR | Status: DC | PRN
  Administered 2014-06-18: 50 ug via INTRAVENOUS
  Administered 2014-06-18: 25 ug via INTRAVENOUS
  Administered 2014-06-18 (×2): 50 ug via INTRAVENOUS
  Administered 2014-06-18: 25 ug via INTRAVENOUS

## 2014-06-18 MED ORDER — LIDOCAINE HCL (CARDIAC) 20 MG/ML IV SOLN
INTRAVENOUS | Status: DC | PRN
  Administered 2014-06-18: 100 mg via INTRAVENOUS

## 2014-06-18 MED ORDER — LACTATED RINGERS IV SOLN
INTRAVENOUS | Status: DC | PRN
  Administered 2014-06-18 (×2): via INTRAVENOUS

## 2014-06-18 MED ORDER — SODIUM CHLORIDE 0.9 % IJ SOLN
INTRAMUSCULAR | Status: AC
  Filled 2014-06-18: qty 10

## 2014-06-18 MED ORDER — OXYCODONE HCL 5 MG PO TABS
5.0000 mg | ORAL_TABLET | ORAL | Status: DC | PRN
  Administered 2014-06-18: 5 mg via ORAL

## 2014-06-18 MED ORDER — ONDANSETRON HCL 4 MG/2ML IJ SOLN: 4.0000 mg | Freq: Once | INTRAMUSCULAR | Status: DC | PRN

## 2014-06-18 MED ORDER — ROCURONIUM BROMIDE 50 MG/5ML IV SOLN
INTRAVENOUS | Status: AC
  Filled 2014-06-18: qty 1

## 2014-06-18 MED ORDER — FENTANYL CITRATE 0.05 MG/ML IJ SOLN
INTRAMUSCULAR | Status: AC
  Filled 2014-06-18: qty 5

## 2014-06-18 MED ORDER — PROPOFOL 10 MG/ML IV BOLUS
INTRAVENOUS | Status: AC
  Filled 2014-06-18: qty 20

## 2014-06-18 MED ORDER — PROPOFOL 10 MG/ML IV BOLUS
INTRAVENOUS | Status: DC | PRN
  Administered 2014-06-18: 200 mg via INTRAVENOUS

## 2014-06-18 SURGICAL SUPPLY — 52 items
APL SKNCLS STERI-STRIP NONHPOA (GAUZE/BANDAGES/DRESSINGS) ×1
BENZOIN TINCTURE PRP APPL 2/3 (GAUZE/BANDAGES/DRESSINGS) ×2 IMPLANT
BLADE CLIPPER SURG (BLADE) ×1 IMPLANT
BLADE SURG 10 STRL SS (BLADE) ×2 IMPLANT
BLADE SURG 15 STRL LF DISP TIS (BLADE) ×1 IMPLANT
BLADE SURG 15 STRL SS (BLADE) ×2
CATH KIT ON Q 5IN SLV (PAIN MANAGEMENT) ×1 IMPLANT
CHLORAPREP W/TINT 26ML (MISCELLANEOUS) ×2 IMPLANT
COVER SURGICAL LIGHT HANDLE (MISCELLANEOUS) ×2 IMPLANT
DRAIN PENROSE 1/2X12 LTX STRL (WOUND CARE) ×1 IMPLANT
DRAPE INCISE IOBAN 66X45 STRL (DRAPES) ×2 IMPLANT
DRAPE LAPAROTOMY TRNSV 102X78 (DRAPE) ×2 IMPLANT
DRAPE UTILITY 15X26 W/TAPE STR (DRAPE) ×4 IMPLANT
DRSG OPSITE 6X11 MED (GAUZE/BANDAGES/DRESSINGS) ×1 IMPLANT
DRSG TEGADERM 4X4.75 (GAUZE/BANDAGES/DRESSINGS) ×2 IMPLANT
DRSG TELFA 3X8 NADH (GAUZE/BANDAGES/DRESSINGS) ×2 IMPLANT
ELECT CAUTERY BLADE 6.4 (BLADE) ×2 IMPLANT
ELECT REM PT RETURN 9FT ADLT (ELECTROSURGICAL) ×2
ELECTRODE REM PT RTRN 9FT ADLT (ELECTROSURGICAL) ×1 IMPLANT
GLOVE BIOGEL PI IND STRL 6.5 (GLOVE) IMPLANT
GLOVE BIOGEL PI IND STRL 7.5 (GLOVE) IMPLANT
GLOVE BIOGEL PI IND STRL 8 (GLOVE) ×1 IMPLANT
GLOVE BIOGEL PI INDICATOR 6.5 (GLOVE) ×1
GLOVE BIOGEL PI INDICATOR 7.5 (GLOVE) ×1
GLOVE BIOGEL PI INDICATOR 8 (GLOVE) ×1
GLOVE ECLIPSE 8.0 STRL XLNG CF (GLOVE) ×2 IMPLANT
GLOVE SURG SS PI 6.5 STRL IVOR (GLOVE) ×1 IMPLANT
GOWN STRL REUS W/ TWL LRG LVL3 (GOWN DISPOSABLE) ×2 IMPLANT
GOWN STRL REUS W/TWL LRG LVL3 (GOWN DISPOSABLE) ×4
KIT BASIN OR (CUSTOM PROCEDURE TRAY) ×2 IMPLANT
KIT ROOM TURNOVER OR (KITS) ×2 IMPLANT
MESH HERNIA 3X6 (Mesh General) ×1 IMPLANT
NDL HYPO 25GX1X1/2 BEV (NEEDLE) ×1 IMPLANT
NEEDLE HYPO 25GX1X1/2 BEV (NEEDLE) ×2 IMPLANT
NS IRRIG 1000ML POUR BTL (IV SOLUTION) ×2 IMPLANT
PACK SURGICAL SETUP 50X90 (CUSTOM PROCEDURE TRAY) ×2 IMPLANT
PAD ARMBOARD 7.5X6 YLW CONV (MISCELLANEOUS) ×3 IMPLANT
PAD DRESSING TELFA 3X8 NADH (GAUZE/BANDAGES/DRESSINGS) ×1 IMPLANT
PENCIL BUTTON HOLSTER BLD 10FT (ELECTRODE) ×2 IMPLANT
SPECIMEN JAR SMALL (MISCELLANEOUS) IMPLANT
SPONGE LAP 18X18 X RAY DECT (DISPOSABLE) ×2 IMPLANT
STRIP CLOSURE SKIN 1/2X4 (GAUZE/BANDAGES/DRESSINGS) ×2 IMPLANT
SUT MON AB 4-0 PC3 18 (SUTURE) ×2 IMPLANT
SUT PROLENE 2 0 CT2 30 (SUTURE) ×4 IMPLANT
SUT SILK 2 0 SH (SUTURE) IMPLANT
SUT VIC AB 2-0 SH 18 (SUTURE) ×2 IMPLANT
SUT VIC AB 3-0 SH 27 (SUTURE) ×4
SUT VIC AB 3-0 SH 27XBRD (SUTURE) ×1 IMPLANT
SUT VICRYL AB 3 0 TIES (SUTURE) ×2 IMPLANT
SYR CONTROL 10ML LL (SYRINGE) ×2 IMPLANT
TOWEL OR 17X24 6PK STRL BLUE (TOWEL DISPOSABLE) ×2 IMPLANT
TOWEL OR 17X26 10 PK STRL BLUE (TOWEL DISPOSABLE) ×2 IMPLANT

## 2014-06-18 NOTE — Transfer of Care (Signed)
Immediate Anesthesia Transfer of Care Note  Patient: Bob Elliott  Procedure(s) Performed: Procedure(s): RIGHT INGUINAL HERNIA REPAIR (Right) INSERTION OF MESH (Right)  Patient Location: PACU  Anesthesia Type:General  Level of Consciousness: awake, oriented and patient cooperative  Airway & Oxygen Therapy: Patient Spontanous Breathing and Patient connected to nasal cannula oxygen  Post-op Assessment: Report given to PACU RN and Post -op Vital signs reviewed and stable  Post vital signs: Reviewed  Complications: No apparent anesthesia complications

## 2014-06-18 NOTE — Anesthesia Preprocedure Evaluation (Addendum)
Anesthesia Evaluation  Patient identified by MRN, date of birth, ID band Patient awake    Reviewed: Allergy & Precautions, H&P , NPO status , Patient's Chart, lab work & pertinent test results  History of Anesthesia Complications Negative for: history of anesthetic complications  Airway Mallampati: II TM Distance: >3 FB Neck ROM: Limited    Dental  (+) Teeth Intact, Edentulous Upper, Dental Advisory Given   Pulmonary sleep apnea ,  breath sounds clear to auscultation        Cardiovascular hypertension, Pt. on medications Rhythm:Regular Rate:Normal     Neuro/Psych negative neurological ROS  negative psych ROS   GI/Hepatic Neg liver ROS, GERD-  Medicated and Controlled,  Endo/Other  Morbid obesity  Renal/GU negative Renal ROS     Musculoskeletal   Abdominal   Peds  Hematology   Anesthesia Other Findings   Reproductive/Obstetrics negative OB ROS                          Anesthesia Physical Anesthesia Plan  ASA: II  Anesthesia Plan: General   Post-op Pain Management:    Induction: Intravenous  Airway Management Planned: Oral ETT  Additional Equipment:   Intra-op Plan:   Post-operative Plan: Extubation in OR  Informed Consent: I have reviewed the patients History and Physical, chart, labs and discussed the procedure including the risks, benefits and alternatives for the proposed anesthesia with the patient or authorized representative who has indicated his/her understanding and acceptance.     Plan Discussed with: CRNA, Anesthesiologist and Surgeon  Anesthesia Plan Comments:         Anesthesia Quick Evaluation

## 2014-06-18 NOTE — Anesthesia Procedure Notes (Signed)
Procedure Name: LMA Insertion Date/Time: 06/18/2014 7:40 AM Performed by: Luciana Axe K Pre-anesthesia Checklist: Patient identified, Emergency Drugs available, Suction available, Patient being monitored and Timeout performed Patient Re-evaluated:Patient Re-evaluated prior to inductionOxygen Delivery Method: Circle system utilized Preoxygenation: Pre-oxygenation with 100% oxygen Intubation Type: IV induction Ventilation: Mask ventilation without difficulty LMA: LMA inserted LMA Size: 5.0 Number of attempts: 1 Placement Confirmation: positive ETCO2,  breath sounds checked- equal and bilateral and CO2 detector Tube secured with: Tape Dental Injury: Teeth and Oropharynx as per pre-operative assessment

## 2014-06-18 NOTE — Interval H&P Note (Signed)
History and Physical Interval Note:  06/18/2014 7:21 AM  Bob Elliott  has presented today for surgery, with the diagnosis of RIGHT INGUINAL HERNIA  The various methods of treatment have been discussed with the patient and family. After consideration of risks, benefits and other options for treatment, the patient has consented to  Procedure(s): RIGHT INGUINAL HERNIA REPAIR (Right) INSERTION OF MESH (Right) as a surgical intervention .  The patient's history has been reviewed, patient examined, no change in status, stable for surgery.  I have reviewed the patient's chart and labs.  Questions were answered to the patient's satisfaction.     Shant Hence Lenna Sciara

## 2014-06-18 NOTE — Discharge Instructions (Signed)
[facility] _____[facility]   INGUINAL HERNIA REPAIR: POST OP INSTRUCTIONS  Always review your discharge instruction sheet given to you by the facility where your surgery was performed. IF YOU HAVE DISABILITY OR FAMILY LEAVE FORMS, YOU MUST BRING THEM TO THE OFFICE FOR PROCESSING.   DO NOT GIVE THEM TO YOUR DOCTOR.  1. A  prescription for pain medication may be given to you upon discharge.  Take your pain medication as prescribed, if needed.  If narcotic pain medicine is not needed, then you may take acetaminophen (Tylenol) or ibuprofen (Advil) as needed. 2. Take your usually prescribed medications unless otherwise directed. 3. If you need a refill on your pain medication, please contact your pharmacy.  They will contact our office to request authorization. Prescriptions will not be filled after 5 pm or on week-ends. 4. You should follow a light diet the first 24 hours after arrival home, such as soup and crackers, etc.  Be sure to include lots of fluids daily.  Resume your normal diet the day after surgery. 5. Most patients will experience some swelling and bruising around the umbilicus or in the groin and scrotum.  Ice packs and reclining will help.  Swelling and bruising can take several days to resolve.  6. It is common to experience some constipation if taking pain medication after surgery.  Increasing fluid intake and taking a stool softener (such as Colace) will usually help or prevent this problem from occurring.  A mild laxative (Milk of Magnesia or Miralax) should be taken according to package directions if there are no bowel movements after 48 hours. 7. Unless discharge instructions indicate otherwise, you may remove your bandages 72 hours after surgery, and you may shower at that time.  You may have steri-strips (small skin tapes) in place directly over the incision.  These strips should be left on the skin.  If your surgeon used skin glue on the incision, you may shower in 24  hours.  The glue will flake off over the next 2-3 weeks.  Any sutures or staples will be removed at the office during your follow-up visit. 8. ACTIVITIES:  You may resume regular (light) daily activities beginning the next day--such as daily self-care, walking, climbing stairs--gradually increasing activities as tolerated.  You may have sexual intercourse when it is comfortable.  Refrain from any heavy lifting or straining until approved by your doctor-nothing over 10 pounds for 6 weeks. a. You may drive when you are no longer taking prescription pain medication, you can comfortably wear a seatbelt, and you can safely maneuver your car and apply brakes. b. RETURN TO WORK:  __________________________________________________________ 9. You should see your doctor in the office for a follow-up appointment approximately 2-3 weeks after your surgery.  Make sure that you call for this appointment within a day or two after you arrive home to insure a convenient appointment time. 10. OTHER INSTRUCTIONS:  __________________________________________________________________________________________________________________________________________________________________________________________  WHEN TO CALL YOUR DOCTOR: 1. Fever over 101.0 2. Inability to urinate 3. Nausea and/or vomiting 4. Extreme swelling or bruising 5. Continued bleeding from incision. 6. Increased pain, redness, or drainage from the incision  The clinic staff is available to answer your questions during regular business hours.  Please dont hesitate to call and ask to speak to one of the nurses for clinical concerns.  If you have a medical emergency, go to the nearest emergency room or call 911.  A surgeon from [facility] is always on call at the hospital  [address], [place], [place], [place]  [address] ?  [address], [place], [place]   [address] [phone] ? [phone] ? FAX [phone] Web site:  [email or web address]

## 2014-06-18 NOTE — Progress Notes (Signed)
Called Dr.Smith for sign out. Ok to d/c home

## 2014-06-18 NOTE — Op Note (Signed)
Preoperative diagnosis:  Right inguinal hernia.  Postoperative diagnosis:  Same  Procedure:  Right/Left inguinal hernia repair with mesh.  Surgeon:  Jackolyn Confer, M.D.  Anesthesia:  General/LMA with local (Marcaine).  Indication:  This is a 73 year old male with a moderate sized right inguinal hernia. He has some mild discomfort from it. He now presents for repair. The procedure, risks, and after care were discussed with him preoperatively.  Technique:  He was seen in the holding room and the right groin was marked with my initials. He was brought to the operating, placed supine on the operating table, and the anesthetic was administered by the anesthesiologist. The hair in the groin area was clipped as was felt to be necessary. This area was then sterilely prepped and draped.  Local anesthetic was infiltrated in the superficial and deep tissues in the right groin.  An incision was made through the skin and subcutaneous tissue until the external oblique aponeurosis was identified.  Local anesthetic was infiltrated deep to the external oblique aponeurosis. The external oblique aponeurosis was divided through the external ring medially and back toward the anterior superior iliac spine laterally. Using blunt dissection, the shelving edge of the inguinal ligament was identified inferiorly and the internal oblique aponeurosis and muscle were identified superiorly. The ilioinguinal nerve was identified and preserved.  The spermatic cord was isolated and a posterior window was made around it. The large indirect hernia and sac was identified as well as a small direct hernia medial to this.  The hernia sacs were separated from the spermatic cord using blunt dissection. The hernia sacs and their contents were reduced through the  hernia defects   A piece of 3" x 6" polypropylene mesh was brought into the field and anchored 1-2 cm medial to the pubic tubercle with 2-0 Prolene suture. The inferior aspect of  the mesh was anchored to the shelving edge of the inguinal ligament with running 2-0 Prolene suture to a level 1-2 cm lateral to the internal ring. A slit was cut in the mesh creating 2 tails. These were wrapped around the spermatic cord. The superior aspect of the mesh was anchored to the internal oblique aponeurosis and muscle with interrupted 2-0 Vicryl sutures. The 2 tails of the mesh were then crossed creating a new internal ring and were anchored to the shelving edge of the inguinal ligament with 2-0 Prolene suture. The tip of a hemostat could be placed through the new aperture. The lateral aspect of the mesh was then tucked deep to the external oblique aponeurosis.  This provided for good coverage with adequate overlap of the hernia defects.  The wound was inspected and hemostasis was adequate. More local anesthetic was infiltrated widely into the wound. The external oblique aponeurosis was then closed over the mesh and cord with running 3-0 Vicryl suture. The subcutaneous tissue was closed with running 3-0 Vicryl suture. The skin closed with a running 4-0 Monocryl subcuticular stitch.  Steri-Strips and a sterile dressing were applied.  The right testicle was in its normal position in the scrotum.  The procedure was well-tolerated without any apparent complications and the patient was taken to the recovery room in satisfactory condition.

## 2014-06-18 NOTE — H&P (View-Only) (Signed)
Patient ID: Bob Elliott, male   DOB: [birth year], 73 y.o.   MRN: [id number]  Chief Complaint  Patient presents with  . Inguinal Hernia    HPI Bob Elliott is a 73 y.o. male.   HPI  He is referred by Dr. Lysle Rubens because of a right inguinal hernia.  About 2 months ago, he stepped in a hole and noted some pain in the right groin. Since that time, he is noted a swelling particularly when he passes some gas. There is some mild discomfort. No obstructive symptoms. He has had an umbilical hernia repair in the past. No difficulty with urination.  Past Medical History  Diagnosis Date  . Hypertension   . Arthritis     Past Surgical History  Procedure Laterality Date  . Neck surgery  2000    Family History  Problem Relation Age of Onset  . Heart disease Mother   . Stroke Father   . Cancer Brother     colon    Social History History  Substance Use Topics  . Smoking status: Never Smoker   . Smokeless tobacco: Not on file  . Alcohol Use: No    Allergies  Allergen Reactions  . Aspirin Hives, Itching and Nausea Only  . Codeine Hives, Itching and Nausea Only    Current Outpatient Prescriptions  Medication Sig Dispense Refill  . diltiazem (DILACOR XR) 180 MG 24 hr capsule       . doxazosin (CARDURA) 4 MG tablet       . esomeprazole (NEXIUM) 40 MG capsule Take 40 mg by mouth daily at 12 noon.      . fluticasone (FLONASE) 50 MCG/ACT nasal spray       . potassium chloride (MICRO-K) 10 MEQ CR capsule       . timolol (TIMOPTIC-XR) 0.5 % ophthalmic gel-forming       . trandolapril (MAVIK) 4 MG tablet        No current facility-administered medications for this visit.    Review of Systems Review of Systems  Constitutional: Negative.   Respiratory: Negative.   Cardiovascular: Negative.   Gastrointestinal: Negative.   Musculoskeletal: Positive for arthralgias.    Blood pressure 122/70, pulse 58, temperature 97.9 F (36.6 C), resp. rate 12, height 5\' 8"  (1.727 m), weight 202 lb  12.8 oz (91.989 kg).  Physical Exam Physical Exam  Constitutional: No distress.  Overweight male.  HENT:  Head: Normocephalic and atraumatic.  Cardiovascular: Normal rate and regular rhythm.   Pulmonary/Chest: Effort normal.  Abdominal: Soft.  Protuberant.  Subumbilical scar.  Genitourinary:  Moderate size right inguinal bulge that is reducible in the supine position. No left inguinal bulge. No testicular masses.  Musculoskeletal: He exhibits no edema.  Neurological: He is alert.  Skin: Skin is warm and dry.  Psychiatric: He has a normal mood and affect. His behavior is normal.    Data Reviewed None  Assessment    Mildly symptomatic, moderate size right inguinal hernia. We discussed repair and he is interested in this.     Plan    Open repair of right inguinal hernia with mesh.  I have explained the procedure, risks, and aftercare of inguinal hernia repair.  Risks include but are not limited to bleeding, infection, wound problems, anesthesia, recurrence, bladder or intestine injury, urinary retention, testicular dysfunction, chronic pain, mesh problems.  He seems to understand and agrees to proceed.        Keston Seever J 05/21/2014, 4:42 PM

## 2014-06-18 NOTE — Anesthesia Postprocedure Evaluation (Signed)
  Anesthesia Post-op Note  Patient: Bob Elliott  Procedure(s) Performed: Procedure(s): RIGHT INGUINAL HERNIA REPAIR (Right) INSERTION OF MESH (Right)  Patient Location: PACU  Anesthesia Type:General  Level of Consciousness: awake, alert , oriented and patient cooperative  Airway and Oxygen Therapy: Patient Spontanous Breathing  Post-op Pain: mild  Post-op Assessment: Post-op Vital signs reviewed, Patient's Cardiovascular Status Stable, Respiratory Function Stable, Patent Airway and No signs of Nausea or vomiting  Post-op Vital Signs: stable  Last Vitals:  Filed Vitals:   06/18/14 1100  BP:   Pulse:   Temp: 36.3 C  Resp:     Complications: No apparent anesthesia complications

## 2014-06-20 ENCOUNTER — Encounter (HOSPITAL_COMMUNITY): Payer: Self-pay | Admitting: General Surgery

## 2014-07-08 ENCOUNTER — Ambulatory Visit (INDEPENDENT_AMBULATORY_CARE_PROVIDER_SITE_OTHER): Payer: Medicare Other | Admitting: General Surgery

## 2014-07-08 VITALS — BP 128/70 | HR 75 | Temp 98.0°F | Ht 68.0 in | Wt 200.0 lb

## 2014-07-08 DIAGNOSIS — Z4889 Encounter for other specified surgical aftercare: Secondary | ICD-10-CM

## 2014-07-08 NOTE — Patient Instructions (Signed)
6 weeks after the surgery, may resume normal activities as tolerated, as discussed. 

## 2014-07-08 NOTE — Progress Notes (Signed)
He presents for postop followup after open right inguinal hernia repair with mesh.  Post op pain is improving.  No difficulty voiding or having BMs.  Swelling is decreasing.  Had some bruising in the right flank which has resolved.  P.E.  GU:  Right groin ncision clean/dry/intact, swelling in right testicle, repair is solid.  Assessment:  Doing well post hernia repair.  Still with some swelling.  Plan:  Continue light activities for 6 weeks postop then slowly start to resume normal activities as tolerated (no pain or discomfort). Return visit as needed.

## 2014-08-13 ENCOUNTER — Other Ambulatory Visit: Payer: Self-pay | Admitting: Internal Medicine

## 2014-08-13 DIAGNOSIS — K219 Gastro-esophageal reflux disease without esophagitis: Secondary | ICD-10-CM | POA: Diagnosis not present

## 2014-08-13 DIAGNOSIS — N183 Chronic kidney disease, stage 3 unspecified: Secondary | ICD-10-CM | POA: Diagnosis not present

## 2014-08-13 DIAGNOSIS — I10 Essential (primary) hypertension: Secondary | ICD-10-CM | POA: Diagnosis not present

## 2014-08-13 DIAGNOSIS — M5412 Radiculopathy, cervical region: Secondary | ICD-10-CM | POA: Diagnosis not present

## 2014-08-13 DIAGNOSIS — Z23 Encounter for immunization: Secondary | ICD-10-CM | POA: Diagnosis not present

## 2014-08-13 DIAGNOSIS — M5090 Cervical disc disorder, unspecified, unspecified cervical region: Secondary | ICD-10-CM | POA: Diagnosis not present

## 2014-08-22 ENCOUNTER — Ambulatory Visit
Admission: RE | Admit: 2014-08-22 | Discharge: 2014-08-22 | Disposition: A | Discharge status: Home / Self Care | Payer: Medicare Other | Source: Ambulatory Visit | Attending: Internal Medicine | Admitting: Internal Medicine

## 2014-08-22 DIAGNOSIS — M5412 Radiculopathy, cervical region: Secondary | ICD-10-CM

## 2014-08-22 DIAGNOSIS — M542 Cervicalgia: Secondary | ICD-10-CM | POA: Diagnosis not present

## 2014-08-26 DIAGNOSIS — E876 Hypokalemia: Secondary | ICD-10-CM | POA: Diagnosis not present

## 2014-08-26 DIAGNOSIS — M79601 Pain in right arm: Secondary | ICD-10-CM | POA: Diagnosis not present

## 2014-08-26 DIAGNOSIS — N400 Enlarged prostate without lower urinary tract symptoms: Secondary | ICD-10-CM | POA: Diagnosis not present

## 2014-09-07 ENCOUNTER — Encounter (HOSPITAL_COMMUNITY): Payer: Self-pay | Admitting: Emergency Medicine

## 2014-09-07 ENCOUNTER — Emergency Department (HOSPITAL_COMMUNITY)
Admission: EM | Admit: 2014-09-07 | Discharge: 2014-09-07 | Disposition: A | Discharge status: Home / Self Care | Payer: Medicare Other | Attending: Emergency Medicine | Admitting: Emergency Medicine

## 2014-09-07 DIAGNOSIS — M1990 Unspecified osteoarthritis, unspecified site: Secondary | ICD-10-CM | POA: Insufficient documentation

## 2014-09-07 DIAGNOSIS — W540XXA Bitten by dog, initial encounter: Secondary | ICD-10-CM | POA: Diagnosis not present

## 2014-09-07 DIAGNOSIS — Z7982 Long term (current) use of aspirin: Secondary | ICD-10-CM | POA: Diagnosis not present

## 2014-09-07 DIAGNOSIS — I10 Essential (primary) hypertension: Secondary | ICD-10-CM | POA: Insufficient documentation

## 2014-09-07 DIAGNOSIS — S80872A Other superficial bite, left lower leg, initial encounter: Secondary | ICD-10-CM | POA: Diagnosis not present

## 2014-09-07 DIAGNOSIS — Y9389 Activity, other specified: Secondary | ICD-10-CM | POA: Diagnosis not present

## 2014-09-07 DIAGNOSIS — H409 Unspecified glaucoma: Secondary | ICD-10-CM | POA: Insufficient documentation

## 2014-09-07 DIAGNOSIS — S80812A Abrasion, left lower leg, initial encounter: Secondary | ICD-10-CM | POA: Diagnosis not present

## 2014-09-07 DIAGNOSIS — Y9289 Other specified places as the place of occurrence of the external cause: Secondary | ICD-10-CM | POA: Diagnosis not present

## 2014-09-07 DIAGNOSIS — Z79899 Other long term (current) drug therapy: Secondary | ICD-10-CM | POA: Insufficient documentation

## 2014-09-07 NOTE — ED Provider Notes (Signed)
CSN: [id number]     Arrival date & time 09/07/14  1214 History  This chart was scribed for a non-physician practitioner, Brent General, PA-C working with Dorie Rank, MD by Martinique Peace, ED Scribe. The patient was seen in TR04C/TR04C. The patient's care was started at 2:27 PM.     Chief Complaint  Patient presents with  . Animal Bite      Patient is a 73 y.o. male presenting with animal bite. The history is provided by the patient. No language interpreter was used.  Animal Bite Associated symptoms: no fever    HPI Comments: Bob Elliott is a 73 y.o. male who presents to the Emergency Department complaining of animal bite onset this morning. Pt states he was bitten by his neighbor's dog on his lower left leg. Pt reports mild pain described as "stinging". Pt further reports that he is not sure whether or not the dog is up to date with appropriate vaccinations. He adds that he has contacted the police and animal control about this issue as well. He notes that he cleaned wound with iodine and then washed it out. Pt is non-smoker.    Past Medical History  Diagnosis Date  . Hypertension   . Arthritis   . Glaucoma     BOTH EYES   Past Surgical History  Procedure Laterality Date  . Neck surgery  2000  . Tonsillectomy    . Eye surgery      CATARACT R  EYE  . Hernia repair      UMBILICAL  . Inguinal hernia repair Right 06/18/2014    Procedure: RIGHT INGUINAL HERNIA REPAIR;  Surgeon: Odis Hollingshead, MD;  Location: [facility];  Service: General;  Laterality: Right;  . Insertion of mesh Right 06/18/2014    Procedure: INSERTION OF MESH;  Surgeon: Odis Hollingshead, MD;  Location: [facility];  Service: General;  Laterality: Right;   Family History  Problem Relation Age of Onset  . Heart disease Mother   . Stroke Father   . Cancer Brother     colon   History  Substance Use Topics  . Smoking status: Never Smoker   . Smokeless tobacco: Not on file  . Alcohol Use: No    Review of Systems   Constitutional: Negative for fever.  Skin: Positive for wound.       Dog bite; no active bleeding.       Allergies  Aspirin; Codeine; and Shellfish allergy  Home Medications   Prior to Admission medications   Medication Sig Start Date End Date Taking? Authorizing Provider  aspirin EC 81 MG tablet Take 81 mg by mouth daily.    Historical Provider, MD  cetirizine (ZYRTEC) 10 MG tablet Take 10 mg by mouth daily as needed for allergies.    Historical Provider, MD  diltiazem (DILACOR XR) 180 MG 24 hr capsule Take 360 mg by mouth every morning.  02/12/14   Historical Provider, MD  doxazosin (CARDURA) 4 MG tablet Take 4 mg by mouth daily.  02/12/14   Historical Provider, MD  esomeprazole (NEXIUM) 40 MG capsule Take 40 mg by mouth daily as needed (for acid reflux).     Historical Provider, MD  fluticasone (FLONASE) 50 MCG/ACT nasal spray Place 1 spray into both nostrils daily as needed for allergies or rhinitis.  03/28/14   Historical Provider, MD  potassium chloride (MICRO-K) 10 MEQ CR capsule Take 20 mEq by mouth 2 (two) times daily.  03/28/14   Historical Provider,  MD  timolol (TIMOPTIC) 0.5 % ophthalmic solution Place 1 drop into both eyes daily.    Historical Provider, MD  trandolapril (MAVIK) 4 MG tablet Take 4 mg by mouth 2 (two) times daily.  03/31/14   Historical Provider, MD   BP 164/77  Pulse 87  Temp(Src) 98.7 F (37.1 C)  Resp 18  Ht 5\' 8"  (1.727 m)  Wt 200 lb (90.719 kg)  BMI 30.42 kg/m2  SpO2 94% Physical Exam  Nursing note and vitals reviewed. Constitutional: He is oriented to person, place, and time. He appears well-developed and well-nourished. No distress.  HENT:  Head: Normocephalic and atraumatic.  Eyes: Conjunctivae and EOM are normal.  Neck: Neck supple. No tracheal deviation present.  Cardiovascular: Normal rate.   Pulmonary/Chest: Effort normal. No respiratory distress.  Musculoskeletal: Normal range of motion.  Neurological: He is alert and oriented to  person, place, and time.  Skin: Skin is warm and dry.  3 abrasions to medial aspect of left calf. No puncture wounds.   Psychiatric: He has a normal mood and affect. His behavior is normal.    ED Course  Procedures (including critical care time) Labs Review Labs Reviewed - No data to display   Imaging Review No results found.   EKG Interpretation None     Medications - No data to display  2:31 PM- Treatment plan was discussed with patient who verbalizes understanding and agrees.   MDM   Final diagnoses:  Dog bite   37 yom here after being bitten by a dog.  Pt suffered three mild abrasions to L leg without any signs of puncture wound.  No hemorrhaging.  Pt's neighbor stating that they have records to prove the dog was immunized, however were unable to produce them today.  Registration contacted animal control to carry out investigation.  Pt asymptomatic otherwise.  Wounds cleansed in the ER and dressed with antibiotic ointment. Return precautions discussed with pt and pt encouraged to follow up with PCP.  Pt agreeable to this plan.    BP 172/82  Pulse 69  Temp(Src) 98.3 F (36.8 C) (Oral)  Resp 22  Ht 5\' 8"  (1.727 m)  Wt 200 lb (90.719 kg)  BMI 30.42 kg/m2  SpO2 97%  Signed,  Dahlia Bailiff, PA-C 9:46 PM  I personally performed the services described in this documentation, which was scribed in my presence. The recorded information has been reviewed and is accurate.   Carrie Mew, PA-C 09/07/14 2147

## 2014-09-07 NOTE — Discharge Instructions (Signed)

## 2014-09-07 NOTE — ED Notes (Signed)
Declined W/C at D/C and was escorted to lobby by RN. 

## 2014-09-07 NOTE — ED Notes (Addendum)
His neighbors dog bit his L inner ankle this am. 3 abrasion/punctures noted. No active bleeding now. Reports mild stinging pain. He cleaned it with iodine and then washed it. He contacted animal control and the owner of the dog was attempting to locate proof of rabies vaccination.

## 2014-09-08 NOTE — ED Provider Notes (Signed)
Medical screening examination/treatment/procedure(s) were performed by non-physician practitioner and as supervising physician I was immediately available for consultation/collaboration.    Dorie Rank, MD 09/08/14 [phone]

## 2014-10-28 DIAGNOSIS — H40013 Open angle with borderline findings, low risk, bilateral: Secondary | ICD-10-CM | POA: Diagnosis not present

## 2014-10-28 DIAGNOSIS — H40053 Ocular hypertension, bilateral: Secondary | ICD-10-CM | POA: Diagnosis not present

## 2015-01-17 DIAGNOSIS — H40053 Ocular hypertension, bilateral: Secondary | ICD-10-CM | POA: Diagnosis not present

## 2015-01-17 DIAGNOSIS — H26491 Other secondary cataract, right eye: Secondary | ICD-10-CM | POA: Diagnosis not present

## 2015-01-17 DIAGNOSIS — H40013 Open angle with borderline findings, low risk, bilateral: Secondary | ICD-10-CM | POA: Diagnosis not present

## 2015-01-17 DIAGNOSIS — H52203 Unspecified astigmatism, bilateral: Secondary | ICD-10-CM | POA: Diagnosis not present

## 2015-02-11 DIAGNOSIS — K219 Gastro-esophageal reflux disease without esophagitis: Secondary | ICD-10-CM | POA: Diagnosis not present

## 2015-02-11 DIAGNOSIS — M47812 Spondylosis without myelopathy or radiculopathy, cervical region: Secondary | ICD-10-CM | POA: Diagnosis not present

## 2015-02-11 DIAGNOSIS — E782 Mixed hyperlipidemia: Secondary | ICD-10-CM | POA: Diagnosis not present

## 2015-02-11 DIAGNOSIS — N400 Enlarged prostate without lower urinary tract symptoms: Secondary | ICD-10-CM | POA: Diagnosis not present

## 2015-02-11 DIAGNOSIS — I10 Essential (primary) hypertension: Secondary | ICD-10-CM | POA: Diagnosis not present

## 2015-02-11 DIAGNOSIS — Z1389 Encounter for screening for other disorder: Secondary | ICD-10-CM | POA: Diagnosis not present

## 2015-02-11 DIAGNOSIS — N183 Chronic kidney disease, stage 3 (moderate): Secondary | ICD-10-CM | POA: Diagnosis not present

## 2015-02-11 DIAGNOSIS — M109 Gout, unspecified: Secondary | ICD-10-CM | POA: Diagnosis not present

## 2015-02-11 DIAGNOSIS — R7309 Other abnormal glucose: Secondary | ICD-10-CM | POA: Diagnosis not present

## 2015-02-11 DIAGNOSIS — E876 Hypokalemia: Secondary | ICD-10-CM | POA: Diagnosis not present

## 2015-02-11 DIAGNOSIS — K224 Dyskinesia of esophagus: Secondary | ICD-10-CM | POA: Diagnosis not present

## 2015-02-11 DIAGNOSIS — Z0000 Encounter for general adult medical examination without abnormal findings: Secondary | ICD-10-CM | POA: Diagnosis not present

## 2015-02-20 DIAGNOSIS — H26491 Other secondary cataract, right eye: Secondary | ICD-10-CM | POA: Diagnosis not present

## 2015-06-04 DIAGNOSIS — N433 Hydrocele, unspecified: Secondary | ICD-10-CM | POA: Diagnosis not present

## 2015-06-04 DIAGNOSIS — M545 Low back pain: Secondary | ICD-10-CM | POA: Diagnosis not present

## 2015-08-14 DIAGNOSIS — M47812 Spondylosis without myelopathy or radiculopathy, cervical region: Secondary | ICD-10-CM | POA: Diagnosis not present

## 2015-08-14 DIAGNOSIS — I10 Essential (primary) hypertension: Secondary | ICD-10-CM | POA: Diagnosis not present

## 2015-08-14 DIAGNOSIS — M109 Gout, unspecified: Secondary | ICD-10-CM | POA: Diagnosis not present

## 2015-08-14 DIAGNOSIS — J309 Allergic rhinitis, unspecified: Secondary | ICD-10-CM | POA: Diagnosis not present

## 2015-08-14 DIAGNOSIS — N183 Chronic kidney disease, stage 3 (moderate): Secondary | ICD-10-CM | POA: Diagnosis not present

## 2015-08-14 DIAGNOSIS — E876 Hypokalemia: Secondary | ICD-10-CM | POA: Diagnosis not present

## 2015-08-14 DIAGNOSIS — K219 Gastro-esophageal reflux disease without esophagitis: Secondary | ICD-10-CM | POA: Diagnosis not present

## 2015-08-14 DIAGNOSIS — Z23 Encounter for immunization: Secondary | ICD-10-CM | POA: Diagnosis not present

## 2015-08-19 DIAGNOSIS — H40013 Open angle with borderline findings, low risk, bilateral: Secondary | ICD-10-CM | POA: Diagnosis not present

## 2015-08-19 DIAGNOSIS — H40053 Ocular hypertension, bilateral: Secondary | ICD-10-CM | POA: Diagnosis not present

## 2016-02-12 DIAGNOSIS — K219 Gastro-esophageal reflux disease without esophagitis: Secondary | ICD-10-CM | POA: Diagnosis not present

## 2016-02-12 DIAGNOSIS — N183 Chronic kidney disease, stage 3 (moderate): Secondary | ICD-10-CM | POA: Diagnosis not present

## 2016-02-12 DIAGNOSIS — I10 Essential (primary) hypertension: Secondary | ICD-10-CM | POA: Diagnosis not present

## 2016-02-12 DIAGNOSIS — K224 Dyskinesia of esophagus: Secondary | ICD-10-CM | POA: Diagnosis not present

## 2016-02-12 DIAGNOSIS — E876 Hypokalemia: Secondary | ICD-10-CM | POA: Diagnosis not present

## 2016-02-12 DIAGNOSIS — N400 Enlarged prostate without lower urinary tract symptoms: Secondary | ICD-10-CM | POA: Diagnosis not present

## 2016-02-12 DIAGNOSIS — E782 Mixed hyperlipidemia: Secondary | ICD-10-CM | POA: Diagnosis not present

## 2016-02-12 DIAGNOSIS — Z1389 Encounter for screening for other disorder: Secondary | ICD-10-CM | POA: Diagnosis not present

## 2016-02-12 DIAGNOSIS — Z0000 Encounter for general adult medical examination without abnormal findings: Secondary | ICD-10-CM | POA: Diagnosis not present

## 2016-02-12 DIAGNOSIS — D72819 Decreased white blood cell count, unspecified: Secondary | ICD-10-CM | POA: Diagnosis not present

## 2016-02-12 DIAGNOSIS — R7309 Other abnormal glucose: Secondary | ICD-10-CM | POA: Diagnosis not present

## 2016-02-12 DIAGNOSIS — J309 Allergic rhinitis, unspecified: Secondary | ICD-10-CM | POA: Diagnosis not present

## 2016-02-24 DIAGNOSIS — H40011 Open angle with borderline findings, low risk, right eye: Secondary | ICD-10-CM | POA: Diagnosis not present

## 2016-02-24 DIAGNOSIS — Z0100 Encounter for examination of eyes and vision without abnormal findings: Secondary | ICD-10-CM | POA: Diagnosis not present

## 2016-02-24 DIAGNOSIS — H40012 Open angle with borderline findings, low risk, left eye: Secondary | ICD-10-CM | POA: Diagnosis not present

## 2016-02-24 DIAGNOSIS — H40051 Ocular hypertension, right eye: Secondary | ICD-10-CM | POA: Diagnosis not present

## 2016-02-27 ENCOUNTER — Emergency Department (HOSPITAL_COMMUNITY): Payer: Medicare Other

## 2016-02-27 ENCOUNTER — Emergency Department (HOSPITAL_COMMUNITY)
Admission: EM | Admit: 2016-02-27 | Discharge: 2016-02-27 | Disposition: A | Discharge status: Home / Self Care | Payer: Medicare Other | Attending: Emergency Medicine | Admitting: Emergency Medicine

## 2016-02-27 ENCOUNTER — Encounter (HOSPITAL_COMMUNITY): Payer: Self-pay | Admitting: Family Medicine

## 2016-02-27 DIAGNOSIS — M1990 Unspecified osteoarthritis, unspecified site: Secondary | ICD-10-CM | POA: Diagnosis not present

## 2016-02-27 DIAGNOSIS — Z7982 Long term (current) use of aspirin: Secondary | ICD-10-CM | POA: Insufficient documentation

## 2016-02-27 DIAGNOSIS — Z79899 Other long term (current) drug therapy: Secondary | ICD-10-CM | POA: Diagnosis not present

## 2016-02-27 DIAGNOSIS — H409 Unspecified glaucoma: Secondary | ICD-10-CM | POA: Diagnosis not present

## 2016-02-27 DIAGNOSIS — I10 Essential (primary) hypertension: Secondary | ICD-10-CM | POA: Insufficient documentation

## 2016-02-27 DIAGNOSIS — R05 Cough: Secondary | ICD-10-CM | POA: Diagnosis not present

## 2016-02-27 DIAGNOSIS — R0789 Other chest pain: Secondary | ICD-10-CM | POA: Diagnosis not present

## 2016-02-27 DIAGNOSIS — J45901 Unspecified asthma with (acute) exacerbation: Secondary | ICD-10-CM

## 2016-02-27 DIAGNOSIS — Z7951 Long term (current) use of inhaled steroids: Secondary | ICD-10-CM | POA: Insufficient documentation

## 2016-02-27 LAB — CBC
HCT: 46.4 % (ref 39.0–52.0)
Hemoglobin: 15.5 g/dL (ref 13.0–17.0)
MCH: 29.9 pg (ref 26.0–34.0)
MCHC: 33.4 g/dL (ref 30.0–36.0)
MCV: 89.4 fL (ref 78.0–100.0)
Platelets: 170 10*3/uL (ref 150–400)
RBC: 5.19 MIL/uL (ref 4.22–5.81)
RDW: 14.3 % (ref 11.5–15.5)
WBC: 10.7 10*3/uL — ABNORMAL HIGH (ref 4.0–10.5)

## 2016-02-27 LAB — BASIC METABOLIC PANEL
Anion gap: 12 (ref 5–15)
BUN: 16 mg/dL (ref 6–20)
CO2: 24 mmol/L (ref 22–32)
Calcium: 9 mg/dL (ref 8.9–10.3)
Chloride: 105 mmol/L (ref 101–111)
Creatinine, Ser: 1.13 mg/dL (ref 0.61–1.24)
GFR calc Af Amer: >60 mL/min (ref >60)
GFR calc non Af Amer: >60 mL/min (ref >60)
Glucose, Bld: 104 mg/dL — ABNORMAL HIGH (ref 65–99)
Potassium: 3.7 mmol/L (ref 3.5–5.1)
Sodium: 141 mmol/L (ref 135–145)

## 2016-02-27 LAB — I-STAT TROPONIN, ED: Troponin i, poc: 0.02 ng/mL (ref 0.00–0.08)

## 2016-02-27 MED ORDER — ALBUTEROL SULFATE HFA 108 (90 BASE) MCG/ACT IN AERS
1.0000 | INHALATION_SPRAY | Freq: Once | RESPIRATORY_TRACT | Status: AC
  Administered 2016-02-27: 1 via RESPIRATORY_TRACT
  Filled 2016-02-27: qty 6.7

## 2016-02-27 MED ORDER — AZITHROMYCIN 250 MG PO TABS: 250.0000 mg | ORAL_TABLET | Freq: Every day | ORAL | Status: DC

## 2016-02-27 MED ORDER — IPRATROPIUM-ALBUTEROL 0.5-2.5 (3) MG/3ML IN SOLN
3.0000 mL | Freq: Once | RESPIRATORY_TRACT | Status: AC
  Administered 2016-02-27: 3 mL via RESPIRATORY_TRACT
  Filled 2016-02-27: qty 3

## 2016-02-27 MED ORDER — CETIRIZINE HCL 10 MG PO CHEW: 10.0000 mg | CHEWABLE_TABLET | Freq: Every day | ORAL | Status: DC

## 2016-02-27 NOTE — Discharge Instructions (Signed)
Cough, Adult Coughing is a reflex that clears your throat and your airways. Coughing helps to heal and protect your lungs. It is normal to cough occasionally, but a cough that happens with other symptoms or lasts a long time may be a sign of a condition that needs treatment. A cough may last only 2-3 weeks (acute), or it may last longer than 8 weeks (chronic). CAUSES Coughing is commonly caused by:  Breathing in substances that irritate your lungs.  A viral or bacterial respiratory infection.  Allergies.  Asthma.  Postnasal drip.  Smoking.  Acid backing up from the stomach into the esophagus (gastroesophageal reflux).  Certain medicines.  Chronic lung problems, including COPD (or rarely, lung cancer).  Other medical conditions such as heart failure. HOME CARE INSTRUCTIONS  Pay attention to any changes in your symptoms. Take these actions to help with your discomfort:  Take medicines only as told by your health care provider.  If you were prescribed an antibiotic medicine, take it as told by your health care provider. Do not stop taking the antibiotic even if you start to feel better.  Talk with your health care provider before you take a cough suppressant medicine.  Drink enough fluid to keep your urine clear or pale yellow.  If the air is dry, use a cold steam vaporizer or humidifier in your bedroom or your home to help loosen secretions.  Avoid anything that causes you to cough at work or at home.  If your cough is worse at night, try sleeping in a semi-upright position.  Avoid cigarette smoke. If you smoke, quit smoking. If you need help quitting, ask your health care provider.  Avoid caffeine.  Avoid alcohol.  Rest as needed. SEEK MEDICAL CARE IF:   You have new symptoms.  You cough up pus.  Your cough does not get better after 2-3 weeks, or your cough gets worse.  You cannot control your cough with suppressant medicines and you are losing sleep.  You  develop pain that is getting worse or pain that is not controlled with pain medicines.  You have a fever.  You have unexplained weight loss.  You have night sweats. SEEK IMMEDIATE MEDICAL CARE IF:  You cough up blood.  You have difficulty breathing.  Your heartbeat is very fast.   This information is not intended to replace advice given to you by your health care provider. Make sure you discuss any questions you have with your health care provider.   Document Released: 04/30/2011 Document Revised: 07/23/2015 Document Reviewed: 01/08/2015 Elsevier Interactive Patient Education 2016 [employer]  Bronchospasm, Adult A bronchospasm is when the tubes that carry air in and out of your lungs (airways) spasm or tighten. During a bronchospasm it is hard to breathe. This is because the airways get smaller. A bronchospasm can be triggered by:  Allergies. These may be to animals, pollen, food, or mold.  Infection. This is a common cause of bronchospasm.  Exercise.  Irritants. These include pollution, cigarette smoke, strong odors, aerosol sprays, and paint fumes.  Weather changes.  Stress.  Being emotional. HOME CARE   Always have a plan for getting help. Know when to call your doctor and local emergency services (911 in the [place] Know where you can get emergency care.  Only take medicines as told by your doctor.  If you were prescribed an inhaler or nebulizer machine, ask your doctor how to use it correctly. Always use a spacer with your inhaler if you were  given one.  Stay calm during an attack. Try to relax and breathe more slowly.  Control your home environment:  Change your heating and air conditioning filter at least once a month.  Limit your use of fireplaces and wood stoves.  Do not  smoke. Do not  allow smoking in your home.  Avoid perfumes and fragrances.  Get rid of pests (such as roaches and mice) and their droppings.  Throw away plants if you see mold on  them.  Keep your house clean and dust free.  Replace carpet with wood, tile, or vinyl flooring. Carpet can trap dander and dust.  Use allergy-proof pillows, mattress covers, and box spring covers.  Wash bed sheets and blankets every week in hot water. Dry them in a dryer.  Use blankets that are made of polyester or cotton.  Wash hands frequently. GET HELP IF:  You have muscle aches.  You have chest pain.  The thick spit you spit or cough up (sputum) changes from clear or white to yellow, green, gray, or bloody.  The thick spit you spit or cough up gets thicker.  There are problems that may be related to the medicine you are given such as:  A rash.  Itching.  Swelling.  Trouble breathing. GET HELP RIGHT AWAY IF:  You feel you cannot breathe or catch your breath.  You cannot stop coughing.  Your treatment is not helping you breathe better.  You have very bad chest pain. MAKE SURE YOU:   Understand these instructions.  Will watch your condition.  Will get help right away if you are not doing well or get worse.   This information is not intended to replace advice given to you by your health care provider. Make sure you discuss any questions you have with your health care provider.  Take antibiotics as prescribed. Use albuterol inhaler as needed. Take Zyrtec daily for prevention of your symptoms. Follow up with your primary care provider for further evaluation. Return to the emergency room if you experience fevers, chills, difficulty breathing, chest pain

## 2016-02-27 NOTE — ED Notes (Signed)
Pt comfortable with discharge and follow up instructions. Pt declines wheelchair, escorted to waiting area. Rx x2

## 2016-02-27 NOTE — ED Notes (Signed)
Patient endorses cough x 1 week. Strong, and progressive and productive with thick dark colored sputum first thing in the morning and states throughout the day sputum becomes yellow and thinner. Patient endorses intermittent chest pain associates with coughing worse in the past 2-3days. Patient took tylenol last night with some relief. Patient has been using loratadine, Robitussin & tylenol with symptom management without relief. Patient alert and oriented, without chest pain at present time. Patient endorses mild tenderness upon palpitation. Patient transported to xray.

## 2016-02-27 NOTE — ED Notes (Signed)
Has been coughing bad for last couple of days , saw his dr a week ago and given shot and pills for cough and it helped some but now cough is back making his chest hurt and his back

## 2016-03-01 NOTE — ED Provider Notes (Signed)
CSN: [id number]     Arrival date & time 02/27/16  1251 History   First MD Initiated Contact with Patient 02/27/16 1306     Chief Complaint  Patient presents with  . Cough  . Chest Pain     (Consider location/radiation/quality/duration/timing/severity/associated sxs/prior Treatment) HPI   Bob QADIR is a 75 y.o M with a pmhx of HTN who presents to the ED c/o cough onset 1 month ago. Patient states his cough as gotten worse in the last week especially in the mornings when he wakes up. He is cough is productive with yellow/green colored mucus. Patient has pain in his chest associated with cough that feels like an "sore muscle". Patient states he saw his PCP 1 week ago for same symptoms and received cough medication and a steroid shot without relief of his symptoms. Patient states he has also been using Robitussin and Tylenol without relief. He denies fever, chills, dizziness, lightheadedness, shortness of breath, loss of consciousness, rhinorrhea, otalgia, sore throat.   Past Medical History  Diagnosis Date  . Hypertension   . Arthritis   . Glaucoma     BOTH EYES   Past Surgical History  Procedure Laterality Date  . Neck surgery  2000  . Tonsillectomy    . Eye surgery      CATARACT R  EYE  . Hernia repair      UMBILICAL  . Inguinal hernia repair Right 06/18/2014    Procedure: RIGHT INGUINAL HERNIA REPAIR;  Surgeon: Odis Hollingshead, MD;  Location: [facility];  Service: General;  Laterality: Right;  . Insertion of mesh Right 06/18/2014    Procedure: INSERTION OF MESH;  Surgeon: Odis Hollingshead, MD;  Location: [facility];  Service: General;  Laterality: Right;   Family History  Problem Relation Age of Onset  . Heart disease Mother   . Stroke Father   . Cancer Brother     colon   Social History  Substance Use Topics  . Smoking status: Never Smoker   . Smokeless tobacco: None  . Alcohol Use: No    Review of Systems  All other systems reviewed and are negative.     Allergies   Aspirin; Codeine; and Shellfish allergy  Home Medications   Prior to Admission medications   Medication Sig Start Date End Date Taking? Authorizing Provider  aspirin EC 81 MG tablet Take 81 mg by mouth 2 (two) times a week.    Yes Historical Provider, MD  benzonatate (TESSALON) 200 MG capsule Take 200 mg by mouth 3 (three) times daily as needed for cough.   Yes Historical Provider, MD  diltiazem (DILACOR XR) 180 MG 24 hr capsule Take 360 mg by mouth every morning.  02/12/14  Yes Historical Provider, MD  doxazosin (CARDURA) 4 MG tablet Take 4 mg by mouth daily.  02/12/14  Yes Historical Provider, MD  esomeprazole (NEXIUM) 40 MG capsule Take 40 mg by mouth daily as needed (for acid reflux).    Yes Historical Provider, MD  fluticasone (FLONASE) 50 MCG/ACT nasal spray Place 1 spray into both nostrils daily as needed for allergies or rhinitis.  03/28/14  Yes Historical Provider, MD  loratadine (CLARITIN) 10 MG tablet Take 10 mg by mouth daily.   Yes Historical Provider, MD  potassium chloride (MICRO-K) 10 MEQ CR capsule Take 20 mEq by mouth 2 (two) times daily.  03/28/14  Yes Historical Provider, MD  timolol (TIMOPTIC) 0.5 % ophthalmic solution Place 1 drop into both eyes daily.  Yes Historical Provider, MD  trandolapril (MAVIK) 4 MG tablet Take 4 mg by mouth 2 (two) times daily.  03/31/14  Yes Historical Provider, MD  azithromycin (ZITHROMAX) 250 MG tablet Take 1 tablet (250 mg total) by mouth daily. Take first 2 tablets together, then 1 every day until finished. 02/27/16   Samantha Tripp [place], PA-C  cetirizine (ZYRTEC) 10 MG chewable tablet Chew 1 tablet (10 mg total) by mouth daily. 02/27/16   Samantha Tripp Dowless, PA-C   BP 160/88 mmHg  Pulse 66  Temp(Src) 97.7 F (36.5 C) (Oral)  Resp 18  Ht 5\' 8"  (1.727 m)  Wt 92.987 kg  BMI 31.18 kg/m2  SpO2 98% Physical Exam  Constitutional: He is oriented to person, place, and time. He appears well-developed and well-nourished. No distress.  HENT:   Head: Normocephalic and atraumatic.  Mouth/Throat: No oropharyngeal exudate.  Eyes: Conjunctivae and EOM are normal. Pupils are equal, round, and reactive to light. Right eye exhibits no discharge. Left eye exhibits no discharge. No scleral icterus.  Cardiovascular: Normal rate, regular rhythm, normal heart sounds and intact distal pulses.  Exam reveals no gallop and no friction rub.   No murmur heard. Pulmonary/Chest: Effort normal. No respiratory distress. He has wheezes ( expiratory in all lung fields). He has no rales. He exhibits tenderness ( anterior chest wall).  Abdominal: Soft. He exhibits no distension. There is no tenderness. There is no guarding.  Musculoskeletal: Normal range of motion. He exhibits no edema.  Neurological: He is alert and oriented to person, place, and time.  Skin: Skin is warm and dry. No rash noted. He is not diaphoretic. No erythema. No pallor.  Psychiatric: He has a normal mood and affect. His behavior is normal.  Nursing note and vitals reviewed.   ED Course  Procedures (including critical care time) Labs Review Labs Reviewed  BASIC METABOLIC PANEL - Abnormal; Notable for the following:    Glucose, Bld 104 (*)    All other components within normal limits  CBC - Abnormal; Notable for the following:    WBC 10.7 (*)    All other components within normal limits  I-STAT TROPOININ, ED    Imaging Review No results found. I have personally reviewed and evaluated these images and lab results as part of my medical decision-making.   EKG Interpretation   Date/Time:  Friday February 27 2016 12:55:52 EDT Ventricular Rate:  67 PR Interval:  224 QRS Duration: 92 QT Interval:  428 QTC Calculation: 452 R Axis:   86 Text Interpretation:  Sinus rhythm with 1st degree A-V block Incomplete  right bundle branch block Borderline ECG ED PHYSICIAN INTERPRETATION  AVAILABLE IN [facility] Confirmed by TEST, Record ([id number]) on  02/28/2016 9:08:27 AM      MDM    Final diagnoses:  Allergic bronchitis, unspecified asthma severity, with acute exacerbation    75 y.o M presents to the ED c/o productive cough with yellow sputum onset 1 month ago. Patient has been seen and evaluated by his PCP 1 week ago given steroid shot and cough medication without relief. On presentation to the ED, patient appears well, nontoxic, nonseptic appearing. Vital signs are stable. No evidence of pneumonia on chest x-ray. Wheezes heard in all lung fields on exam. He was given 1 DuoNeb with significant symptomatic relief. Repeat lung auscultation is clear.  Given the patient's symptoms have lasted much limited 10 days will DC home with azithromycin to cover for atypicals. We'll also place patient on Zyrtec daily  as this is likely due to allergic bronchitis. Patient has already received steroid injection. Will also give home albuterol inhaler for prn use. Doubt cardiac etiology. Troponin within normal limits. EKG unremarkable. Patient is medically stable and ready for discharge. Return precautions outlined in patient discharge instructions.  Patient was discussed with and seen by Dr. Laneta Simmers who agrees with the treatment plan.      Dondra Spry [place], PA-C 03/01/16 1304  Leo Grosser, MD 03/03/16 [phone]

## 2016-03-02 DIAGNOSIS — J309 Allergic rhinitis, unspecified: Secondary | ICD-10-CM | POA: Diagnosis not present

## 2016-03-02 DIAGNOSIS — J209 Acute bronchitis, unspecified: Secondary | ICD-10-CM | POA: Diagnosis not present

## 2016-03-24 ENCOUNTER — Other Ambulatory Visit: Payer: Self-pay | Admitting: Gastroenterology

## 2016-05-04 ENCOUNTER — Encounter (HOSPITAL_COMMUNITY): Payer: Self-pay | Admitting: *Deleted

## 2016-05-11 ENCOUNTER — Encounter (HOSPITAL_COMMUNITY)
Admission: RE | Disposition: A | Discharge status: Home / Self Care | Payer: Self-pay | Source: Ambulatory Visit | Attending: Gastroenterology

## 2016-05-11 ENCOUNTER — Ambulatory Visit (HOSPITAL_COMMUNITY): Payer: Medicare Other | Admitting: Certified Registered Nurse Anesthetist

## 2016-05-11 ENCOUNTER — Ambulatory Visit (HOSPITAL_COMMUNITY)
Admission: RE | Admit: 2016-05-11 | Discharge: 2016-05-11 | Disposition: A | Discharge status: Home / Self Care | Payer: Medicare Other | Source: Ambulatory Visit | Attending: Gastroenterology | Admitting: Gastroenterology

## 2016-05-11 ENCOUNTER — Encounter (HOSPITAL_COMMUNITY): Payer: Self-pay | Admitting: Certified Registered Nurse Anesthetist

## 2016-05-11 DIAGNOSIS — I10 Essential (primary) hypertension: Secondary | ICD-10-CM | POA: Diagnosis not present

## 2016-05-11 DIAGNOSIS — H409 Unspecified glaucoma: Secondary | ICD-10-CM | POA: Insufficient documentation

## 2016-05-11 DIAGNOSIS — Z86010 Personal history of colonic polyps: Secondary | ICD-10-CM | POA: Insufficient documentation

## 2016-05-11 DIAGNOSIS — K219 Gastro-esophageal reflux disease without esophagitis: Secondary | ICD-10-CM | POA: Insufficient documentation

## 2016-05-11 DIAGNOSIS — N400 Enlarged prostate without lower urinary tract symptoms: Secondary | ICD-10-CM | POA: Diagnosis not present

## 2016-05-11 DIAGNOSIS — Z1211 Encounter for screening for malignant neoplasm of colon: Secondary | ICD-10-CM | POA: Diagnosis not present

## 2016-05-11 DIAGNOSIS — D123 Benign neoplasm of transverse colon: Secondary | ICD-10-CM | POA: Diagnosis not present

## 2016-05-11 DIAGNOSIS — G4730 Sleep apnea, unspecified: Secondary | ICD-10-CM | POA: Insufficient documentation

## 2016-05-11 DIAGNOSIS — Z79899 Other long term (current) drug therapy: Secondary | ICD-10-CM | POA: Diagnosis not present

## 2016-05-11 HISTORY — DX: Gastro-esophageal reflux disease without esophagitis: K21.9

## 2016-05-11 HISTORY — PX: COLONOSCOPY WITH PROPOFOL: SHX5780

## 2016-05-11 HISTORY — DX: Sleep apnea, unspecified: G47.30

## 2016-05-11 SURGERY — COLONOSCOPY WITH PROPOFOL
Anesthesia: Monitor Anesthesia Care

## 2016-05-11 MED ORDER — LACTATED RINGERS IV SOLN: INTRAVENOUS | Status: DC

## 2016-05-11 MED ORDER — SODIUM CHLORIDE 0.9 % IV SOLN: INTRAVENOUS | Status: DC

## 2016-05-11 MED ORDER — PROPOFOL 10 MG/ML IV BOLUS
INTRAVENOUS | Status: AC
  Filled 2016-05-11: qty 40

## 2016-05-11 MED ORDER — PROPOFOL 10 MG/ML IV BOLUS
INTRAVENOUS | Status: DC | PRN
  Administered 2016-05-11: 20 mg via INTRAVENOUS
  Administered 2016-05-11 (×2): 10 mg via INTRAVENOUS
  Administered 2016-05-11 (×4): 20 mg via INTRAVENOUS
  Administered 2016-05-11: 10 mg via INTRAVENOUS
  Administered 2016-05-11: 20 mg via INTRAVENOUS
  Administered 2016-05-11: 40 mg via INTRAVENOUS

## 2016-05-11 MED ORDER — LACTATED RINGERS IV SOLN
INTRAVENOUS | Status: DC | PRN
  Administered 2016-05-11: 09:00:00 via INTRAVENOUS

## 2016-05-11 SURGICAL SUPPLY — 22 items

## 2016-05-11 NOTE — Anesthesia Postprocedure Evaluation (Signed)
Anesthesia Post Note  Patient: Bob Elliott  Procedure(s) Performed: Procedure(s) (LRB): COLONOSCOPY WITH PROPOFOL (N/A)  Patient location during evaluation: PACU Anesthesia Type: MAC Level of consciousness: awake and alert Pain management: pain level controlled Vital Signs Assessment: post-procedure vital signs reviewed and stable Respiratory status: spontaneous breathing, nonlabored ventilation, respiratory function stable and patient connected to nasal cannula oxygen Cardiovascular status: stable and blood pressure returned to baseline Anesthetic complications: no    Last Vitals:  Filed Vitals:   05/11/16 1100 05/11/16 1110  BP: 166/88 186/89  Pulse: 51 50  Temp:    Resp: 16 16    Last Pain: There were no vitals filed for this visit.               Riccardo Dubin

## 2016-05-11 NOTE — Anesthesia Preprocedure Evaluation (Signed)
Anesthesia Evaluation  Patient identified by MRN, date of birth, ID band Patient awake    Reviewed: Allergy & Precautions, H&P , Patient's Chart, lab work & pertinent test results  Airway Mallampati: II  TM Distance: >3 FB Neck ROM: full    Dental no notable dental hx.    Pulmonary sleep apnea ,    Pulmonary exam normal breath sounds clear to auscultation       Cardiovascular Exercise Tolerance: Good hypertension,  Rhythm:regular Rate:Normal     Neuro/Psych    GI/Hepatic   Endo/Other    Renal/GU      Musculoskeletal   Abdominal   Peds  Hematology   Anesthesia Other Findings   Reproductive/Obstetrics                             Anesthesia Physical Anesthesia Plan  ASA: II  Anesthesia Plan: MAC   Post-op Pain Management:    Induction: Intravenous  Airway Management Planned: Mask and Natural Airway  Additional Equipment:   Intra-op Plan:   Post-operative Plan:   Informed Consent: I have reviewed the patients History and Physical, chart, labs and discussed the procedure including the risks, benefits and alternatives for the proposed anesthesia with the patient or authorized representative who has indicated his/her understanding and acceptance.   Dental Advisory Given  Plan Discussed with: CRNA and Surgeon  Anesthesia Plan Comments: (Discussed sedation and potential to need to place airway or ETT if warranted by clinical changes intra-operatively. We will start procedure as MAC.)        Anesthesia Quick Evaluation

## 2016-05-11 NOTE — H&P (Signed)
  Procedure: Surveillance colonoscopy. 03/11/2011 normal surveillance colonoscopy was performed. History of adenomatous colon polyp removed colonoscopically in the past  History: The patient is a 75 year old male born Feb 09, 1941. He is scheduled to undergo a surveillance colonoscopy today.  Past medical history: Hypertension. Negative cardiac stress test performed in 2007. Hypercholesterolemia. Gastroesophageal reflux. Cervical disc disease requiring surgery in 2001. Osteoarthritis. Gout. Allergic rhinitis. Glaucoma. Benign prostatic hypertrophy. Tonsillectomy. Right renal hernia repair with mesh.  Exam: The patient is alert and lying comfortably on the endoscopy stretcher. Abdomen is soft and nontender to palpation. Lungs are clear to auscultation. Cardiac exam reveals a regular rhythm.  Plan: Proceed with surveillance colonoscopy

## 2016-05-11 NOTE — Discharge Instructions (Signed)
Colonoscopy, Care After °Refer to this sheet in the next few weeks. These instructions provide you with information on caring for yourself after your procedure. Your health care provider may also give you more specific instructions. Your treatment has been planned according to current medical practices, but problems sometimes occur. Call your health care provider if you have any problems or questions after your procedure. °WHAT TO EXPECT AFTER THE PROCEDURE  °After your procedure, it is typical to have the following: °· A small amount of blood in your stool. °· Moderate amounts of gas and mild abdominal cramping or bloating. °HOME CARE INSTRUCTIONS °· Do not drive, operate machinery, or sign important documents for 24 hours. °· You may shower and resume your regular physical activities, but move at a slower pace for the first 24 hours. °· Take frequent rest periods for the first 24 hours. °· Walk around or put a warm pack on your abdomen to help reduce abdominal cramping and bloating. °· Drink enough fluids to keep your urine clear or pale yellow. °· You may resume your normal diet as instructed by your health care provider. Avoid heavy or fried foods that are hard to digest. °· Avoid drinking alcohol for 24 hours or as instructed by your health care provider. °· Only take over-the-counter or prescription medicines as directed by your health care provider. °· If a tissue sample (biopsy) was taken during your procedure: °¨ Do not take aspirin or blood thinners for 7 days, or as instructed by your health care provider. °¨ Do not drink alcohol for 7 days, or as instructed by your health care provider. °¨ Eat soft foods for the first 24 hours. °SEEK MEDICAL CARE IF: °You have persistent spotting of blood in your stool 2-3 days after the procedure. °SEEK IMMEDIATE MEDICAL CARE IF: °· You have more than a small spotting of blood in your stool. °· You pass large blood clots in your stool. °· Your abdomen is swollen  (distended). °· You have nausea or vomiting. °· You have a fever. °· You have increasing abdominal pain that is not relieved with medicine. °  °This information is not intended to replace advice given to you by your health care provider. Make sure you discuss any questions you have with your health care provider. °  °Document Released: 06/15/2004 Document Revised: 08/22/2013 Document Reviewed: 07/09/2013 °Elsevier Interactive Patient Education ©2016 [employer] ° °

## 2016-05-11 NOTE — Op Note (Signed)
[facility] Patient Name: Bob Elliott Procedure Date: 05/11/2016 MRN: [id number] Attending MD: Garlan Fair , MD Date of Birth: 1941/01/14 CSN: [id number] Age: 75 Admit Type: Outpatient Procedure:                Colonoscopy Indications:              High risk colon cancer surveillance: Personal                            history of adenoma less than 10 mm in size Providers:                Garlan Fair, MD, Zenon Mayo, RN, Cherylynn Ridges, Technician, Dion Saucier, CRNA Referring MD:              Medicines:                Propofol per Anesthesia Complications:            No immediate complications. Estimated Blood Loss:     Estimated blood loss: none. Procedure:                Pre-Anesthesia Assessment:                           - Prior to the procedure, a History and Physical                            was performed, and patient medications and                            allergies were reviewed. The patient's tolerance of                            previous anesthesia was also reviewed. The risks                            and benefits of the procedure and the sedation                            options and risks were discussed with the patient.                            All questions were answered, and informed consent                            was obtained. Prior Anticoagulants: The patient has                            taken no previous anticoagulant or antiplatelet                            agents. ASA Grade Assessment: III - A patient with  severe systemic disease. After reviewing the risks                            and benefits, the patient was deemed in                            satisfactory condition to undergo the procedure.                           After obtaining informed consent, the colonoscope                            was passed under direct vision. Throughout the        procedure, the patient's blood pressure, pulse, and                            oxygen saturations were monitored continuously. The                            Colonoscope was introduced through the anus and                            advanced to the the cecum, identified by                            appendiceal orifice and ileocecal valve. The                            colonoscopy was performed without difficulty. The                            patient tolerated the procedure well. The quality                            of the bowel preparation was good. The ileocecal                            valve, the appendiceal orifice and the rectum were                            photographed. Scope In: 10:16:16 AM Scope Out: 10:38:51 AM Scope Withdrawal Time: 0 hours 19 minutes 7 seconds  Total Procedure Duration: 0 hours 22 minutes 35 seconds  Findings:      The perianal and digital rectal examinations were normal.      A 7 mm polyp was found in the proximal transverse colon. The polyp was       sessile. The polyp was removed with a hot snare. Resection and retrieval       were complete.      The exam was otherwise without abnormality. Impression:               - One 7 mm polyp in the proximal transverse colon,  removed with a hot snare. Resected and retrieved.                           - The examination was otherwise normal. Moderate Sedation:      N/A- Per Anesthesia Care Recommendation:           - Patient has a contact number available for                            emergencies. The signs and symptoms of potential                            delayed complications were discussed with the                            patient. Return to normal activities tomorrow.                            Written discharge instructions were provided to the                            patient.                           - Repeat colonoscopy is not recommended for                             surveillance.                           - Resume previous diet.                           - Continue present medications. Procedure Code(s):        --- Professional ---                           [id number], Colonoscopy, flexible; with removal of                            tumor(s), polyp(s), or other lesion(s) by snare                            technique Diagnosis Code(s):        --- Professional ---                           Z86.010, Personal history of colonic polyps                           D12.3, Benign neoplasm of transverse colon (hepatic                            flexure or splenic flexure) CPT copyright 2016 [employer]. All rights reserved. The codes documented in this report are preliminary and upon coder review may  be revised to meet current compliance requirements. Earle Gell,  MD Garlan Fair, MD 05/11/2016 10:44:38 AM This report has been signed electronically. Number of Addenda: 0

## 2016-05-11 NOTE — Transfer of Care (Signed)
Immediate Anesthesia Transfer of Care Note  Patient: Bob Elliott  Procedure(s) Performed: Procedure(s): COLONOSCOPY WITH PROPOFOL (N/A)  Patient Location: PACU and Endoscopy Unit  Anesthesia Type:MAC  Level of Consciousness: awake, oriented, patient cooperative, lethargic and responds to stimulation  Airway & Oxygen Therapy: Patient Spontanous Breathing, Patient connected to face mask oxygen and Patient re-intubated  Post-op Assessment: Report given to RN, Post -op Vital signs reviewed and stable and Patient moving all extremities  Post vital signs: Reviewed and stable  Last Vitals:  Filed Vitals:   05/11/16 0939  BP: 168/85  Pulse: 61  Temp: 37 C  Resp: 20    Last Pain: There were no vitals filed for this visit.       Complications: No apparent anesthesia complications

## 2016-05-11 NOTE — Transfer of Care (Signed)
Immediate Anesthesia Transfer of Care Note  Patient: Bob Elliott  Procedure(s) Performed: Procedure(s): COLONOSCOPY WITH PROPOFOL (N/A)  Patient Location: PACU and Endoscopy Unit  Anesthesia Type:MAC  Level of Consciousness: awake, oriented, patient cooperative, lethargic and responds to stimulation  Airway & Oxygen Therapy: Patient Spontanous Breathing and Patient connected to face mask oxygen  Post-op Assessment: Report given to RN, Post -op Vital signs reviewed and stable and Patient moving all extremities  Post vital signs: Reviewed and stable  Last Vitals:  Filed Vitals:   05/11/16 0939  BP: 168/85  Pulse: 61  Temp: 37 C  Resp: 20    Last Pain: There were no vitals filed for this visit.       Complications: No apparent anesthesia complications

## 2016-05-12 ENCOUNTER — Encounter (HOSPITAL_COMMUNITY): Payer: Self-pay | Admitting: Gastroenterology

## 2016-07-07 DIAGNOSIS — N400 Enlarged prostate without lower urinary tract symptoms: Secondary | ICD-10-CM | POA: Diagnosis not present

## 2016-07-07 DIAGNOSIS — R350 Frequency of micturition: Secondary | ICD-10-CM | POA: Diagnosis not present

## 2016-07-13 ENCOUNTER — Ambulatory Visit: Payer: 59 | Admitting: Podiatry

## 2016-08-16 DIAGNOSIS — K219 Gastro-esophageal reflux disease without esophagitis: Secondary | ICD-10-CM | POA: Diagnosis not present

## 2016-08-16 DIAGNOSIS — R0609 Other forms of dyspnea: Secondary | ICD-10-CM | POA: Diagnosis not present

## 2016-08-16 DIAGNOSIS — Z23 Encounter for immunization: Secondary | ICD-10-CM | POA: Diagnosis not present

## 2016-08-16 DIAGNOSIS — N400 Enlarged prostate without lower urinary tract symptoms: Secondary | ICD-10-CM | POA: Diagnosis not present

## 2016-08-16 DIAGNOSIS — K224 Dyskinesia of esophagus: Secondary | ICD-10-CM | POA: Diagnosis not present

## 2016-08-16 DIAGNOSIS — I10 Essential (primary) hypertension: Secondary | ICD-10-CM | POA: Diagnosis not present

## 2016-08-20 ENCOUNTER — Ambulatory Visit (INDEPENDENT_AMBULATORY_CARE_PROVIDER_SITE_OTHER): Payer: Medicare Other | Admitting: Physician Assistant

## 2016-08-20 ENCOUNTER — Encounter: Payer: Self-pay | Admitting: Physician Assistant

## 2016-08-20 VITALS — BP 136/58 | HR 55 | Ht 68.0 in | Wt 201.1 lb

## 2016-08-20 DIAGNOSIS — I10 Essential (primary) hypertension: Secondary | ICD-10-CM | POA: Diagnosis not present

## 2016-08-20 DIAGNOSIS — R0602 Shortness of breath: Secondary | ICD-10-CM

## 2016-08-20 DIAGNOSIS — R0789 Other chest pain: Secondary | ICD-10-CM | POA: Diagnosis not present

## 2016-08-20 DIAGNOSIS — I440 Atrioventricular block, first degree: Secondary | ICD-10-CM

## 2016-08-20 DIAGNOSIS — R0609 Other forms of dyspnea: Secondary | ICD-10-CM

## 2016-08-20 DIAGNOSIS — R0600 Dyspnea, unspecified: Secondary | ICD-10-CM

## 2016-08-20 DIAGNOSIS — R001 Bradycardia, unspecified: Secondary | ICD-10-CM

## 2016-08-20 DIAGNOSIS — I509 Heart failure, unspecified: Secondary | ICD-10-CM

## 2016-08-20 LAB — CBC
HCT: 45.6 % (ref 38.5–50.0)
Hemoglobin: 15.4 g/dL (ref 13.2–17.1)
MCH: 30.3 pg (ref 27.0–33.0)
MCHC: 33.8 g/dL (ref 32.0–36.0)
MCV: 89.8 fL (ref 80.0–100.0)
MPV: 11.5 fL (ref 7.5–12.5)
Platelets: 192 10*3/uL (ref 140–400)
RBC: 5.08 MIL/uL (ref 4.20–5.80)
RDW: 14.1 % (ref 11.0–15.0)
WBC: 6.1 10*3/uL (ref 3.8–10.8)

## 2016-08-20 LAB — COMPREHENSIVE METABOLIC PANEL
ALT: 11 U/L (ref 9–46)
AST: 14 U/L (ref 10–35)
Albumin: 3.9 g/dL (ref 3.6–5.1)
Alkaline Phosphatase: 71 U/L (ref 40–115)
BUN: 13 mg/dL (ref 7–25)
CO2: 32 mmol/L — ABNORMAL HIGH (ref 20–31)
Calcium: 8.9 mg/dL (ref 8.6–10.3)
Chloride: 102 mmol/L (ref 98–110)
Creat: 1.13 mg/dL (ref 0.70–1.18)
Glucose, Bld: 94 mg/dL (ref 65–99)
Potassium: 3.9 mmol/L (ref 3.5–5.3)
Sodium: 140 mmol/L (ref 135–146)
Total Bilirubin: 0.5 mg/dL (ref 0.2–1.2)
Total Protein: 6.6 g/dL (ref 6.1–8.1)

## 2016-08-20 MED ORDER — DILTIAZEM HCL ER 180 MG PO CP24: 180.0000 mg | ORAL_CAPSULE | Freq: Every morning | ORAL | 3 refills | Status: DC

## 2016-08-20 NOTE — Progress Notes (Signed)
Cardiology Office Note    Date:  08/20/2016   ID:  Bob Elliott, DOB 12-17-1940, MRN [id number]  PCP:  [name], MD  Cardiologist: New (Dr. Meda Coffee)  Chief Complaint: Shortness of breath and chest pain  History of Present Illness:   Bob Elliott is a 75 y.o. male HTN, HLD, GERD, esophageal dysmotility, BPH and mild CPAP on study 08/2004 (not on CPAP) who recently seen by PCP 08/16/16 for DOE and chest pain --> here today for further evaluation.   Negative stress test in 2007 for pre-op clearance of neck surgery. At that had chest pain --> resolved after surgery.   The patient was admitted 02/2008 for atypical chest pain. EKG, nonacute cardiac. Enzymes negative x3.  CT of the chest negative despite of minimally elevated D-dimer of 0.6.  Long standing hx of GI including GERD, esophageal dysmotility,GI bleed (F/u EGD and colonoscopy did not showed acute bleeding) and Schatzki's ring.   He has been having exertional dyspnea (moving lawn and walking) for past 2-3 months. Seems getting worse. No chest pain. States laying on Left side makes him to have shortness of breath initially and after adjusting body position it resolves. Admits to having intermittent dizziness and LE edema. No orthopnea, PND, syncope. Bending down make shortness of breath worse.   No hx of tobacco abuse or illicit drug use. Stopped drinking alcohol 1979. Mother has CHF.    Past Medical History:  Diagnosis Date  . Arthritis   . Bronchitis    4'17- given shot and oral meds- all resolved  . GERD (gastroesophageal reflux disease)   . Glaucoma    BOTH EYES  . Hypertension   . Sleep apnea    "mild" no cpap    Past Surgical History:  Procedure Laterality Date  . COLONOSCOPY WITH PROPOFOL N/A 05/11/2016   Procedure: COLONOSCOPY WITH PROPOFOL;  Surgeon: Garlan Fair, MD;  Location: [facility];  Service: Endoscopy;  Laterality: N/A;  . EYE SURGERY     CATARACT R  EYE  . HERNIA REPAIR     UMBILICAL  .  INGUINAL HERNIA REPAIR Right 06/18/2014   Procedure: RIGHT INGUINAL HERNIA REPAIR;  Surgeon: Odis Hollingshead, MD;  Location: [facility];  Service: General;  Laterality: Right;  . INSERTION OF MESH Right 06/18/2014   Procedure: INSERTION OF MESH;  Surgeon: Odis Hollingshead, MD;  Location: [facility];  Service: General;  Laterality: Right;  . NECK SURGERY  2000   cervical disc surgery(limited ROM)- retained hardware  . TONSILLECTOMY      Current Medications: Prior to Admission medications   Medication Sig Start Date End Date Taking? Authorizing Provider  azithromycin (ZITHROMAX) 250 MG tablet Take 1 tablet (250 mg total) by mouth daily. Take first 2 tablets together, then 1 every day until finished. 02/27/16   Samantha Tripp Dowless, PA-C  colchicine 0.6 MG tablet Take 0.6 mg by mouth daily as needed (gout).    Historical Provider, MD  cyclobenzaprine (FLEXERIL) 5 MG tablet Take 5 mg by mouth 3 times/day as needed-between meals & bedtime for muscle spasms.    Historical Provider, MD  diltiazem (DILACOR XR) 180 MG 24 hr capsule Take 360 mg by mouth every morning.  02/12/14   Historical Provider, MD  doxazosin (CARDURA) 4 MG tablet Take 4 mg by mouth daily.  02/12/14   Historical Provider, MD  esomeprazole (NEXIUM) 40 MG capsule Take 40 mg by mouth daily as needed (for acid reflux).     Historical  Provider, MD  fluticasone (FLONASE) 50 MCG/ACT nasal spray Place 2 sprays into both nostrils daily as needed for allergies or rhinitis.  03/28/14   Historical Provider, MD  loratadine (CLARITIN) 10 MG tablet Take 10 mg by mouth daily.    Historical Provider, MD  naproxen sodium (ANAPROX) 220 MG tablet Take 220 mg by mouth daily as needed (pain).    Historical Provider, MD  potassium chloride (MICRO-K) 10 MEQ CR capsule Take 20 mEq by mouth 2 (two) times daily.  03/28/14   Historical Provider, MD  timolol (TIMOPTIC) 0.5 % ophthalmic solution Place 1 drop into both eyes daily.    Historical Provider, MD  trandolapril  (MAVIK) 4 MG tablet Take 4 mg by mouth 2 (two) times daily.  03/31/14   Historical Provider, MD    Allergies:   Aspirin; Codeine; and Shellfish allergy   Social History   Social History  . Marital status: Married    Spouse name: N/A  . Number of children: N/A  . Years of education: N/A   Social History Main Topics  . Smoking status: Never Smoker  . Smokeless tobacco: Never Used  . Alcohol use Yes     Comment: none since 80's  . Drug use: No  . Sexual activity: Not Asked   Other Topics Concern  . None   Social History Narrative  . None     Family History:  The patient's family history includes Cancer in his brother; Heart disease in his mother; Stroke in his father.   ROS:   Please see the history of present illness.    ROS All other systems reviewed and are negative.   PHYSICAL EXAM:   VS:  BP (!) 136/58 (BP Location: Right Arm)   Pulse (!) 55   Ht 5\' 8"  (1.727 m)   Wt 201 lb 1.9 oz (91.2 kg)   SpO2 95%   BMI 30.58 kg/m    GEN: Well nourished, well developed, in no acute distress  HEENT: normal  Neck: + JVD, carotid bruits, or masses Cardiac: RRR; systolic murmurs, rubs, or gallops, 1+ BL LE edema  Respiratory:  clear to auscultation bilaterally, normal work of breathing GI: soft, nontender,distended, + BS MS: no deformity or atrophy  Skin: warm and dry, no rash Neuro:  Alert and Oriented x 3, Strength and sensation are intact Psych: euthymic mood, full affect  Wt Readings from Last 3 Encounters:  08/20/16 201 lb 1.9 oz (91.2 kg)  05/11/16 192 lb (87.1 kg)  02/27/16 205 lb (93 kg)      Studies/Labs Reviewed:   EKG:  EKG is ordered today.  The ekg ordered today demonstrates sinus rhythm at rate of 50 bpm, PR interval 280ms  Recent Labs: 02/27/2016: BUN 16; Creatinine, Ser 1.13; Hemoglobin 15.5; Platelets 170; Potassium 3.7; Sodium 141   Lipid Panel    Component Value Date/Time   CHOL  02/17/2008 1145    179        ATP III CLASSIFICATION:  <200      mg/dL   Desirable  200-239  mg/dL   Borderline High  >=240    mg/dL   High   TRIG 136 02/17/2008 1145   HDL 37 (L) 02/17/2008 1145   CHOLHDL 4.8 02/17/2008 1145   VLDL 27 02/17/2008 1145   LDLCALC (H) 02/17/2008 1145    115        Total Cholesterol/HDL:CHD Risk Coronary Heart Disease Risk Table  Men   Women  1/2 Average Risk   3.4   3.3    Additional studies/ records that were reviewed today include:   AS above   ASSESSMENT & PLAN:   1. Dyspnea on exertion - This been getting worse. Sign of volume over load on exam. Concern for R sided HF. Will get CMP, CBC, BNP, echo and stress test. Start lasix 40mg  qd.   2. 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 Will reduce Dilacor XR to 180mg  qd.   3. HTN - BP stable. As above. Continue Trandolapril 4mg  BID.    Medication Adjustments/Labs and Tests Ordered: Current medicines are reviewed at length with the patient today.  Concerns regarding medicines are outlined above.  Medication changes, Labs and Tests ordered today are listed in the Patient Instructions below. Patient Instructions  Medication Instructions:  Your physician has recommended you make the following change in your medication:  1.  DECREASE the Cardizem to 180 taking 1 tablet daily   Labwork: TODAY:  CMP, BNP, & CBC  Testing/Procedures: Your physician has requested that you have an echocardiogram. Echocardiography is a painless test that uses sound waves to create images of your heart. It provides your doctor with information about the size and shape of your heart and how well your heart's chambers and valves are working. This procedure takes approximately one hour. There are no restrictions for this procedure.   Your physician has requested that you have en exercise stress myoview. For further information please visit [email or web address]. Please follow instruction sheet, as given.  Follow-Up: Your  physician recommends that you schedule a follow-up appointment in: 2 [place] APP ON HER TEAM   Any Other Special Instructions Will Be Listed Below (If Applicable).  Echocardiogram An echocardiogram, or echocardiography, uses sound waves (ultrasound) to produce an image of your heart. The echocardiogram is simple, painless, obtained within a short period of time, and offers valuable information to your health care provider. The images from an echocardiogram can provide information such as:  Evidence of coronary artery disease (CAD).  Heart size.  Heart muscle function.  Heart valve function.  Aneurysm detection.  Evidence of a past heart attack.  Fluid buildup around the heart.  Heart muscle thickening.  Assess heart valve function. LET [facility] CARE PROVIDER KNOW ABOUT:  Any allergies you have.  All medicines you are taking, including vitamins, herbs, eye drops, creams, and over-the-counter medicines.  Previous problems you or members of your family have had with the use of anesthetics.  Any blood disorders you have.  Previous surgeries you have had.  Medical conditions you have.  Possibility of pregnancy, if this applies. BEFORE THE PROCEDURE  No special preparation is needed. Eat and drink normally.  PROCEDURE   In order to produce an image of your heart, gel will be applied to your chest and a wand-like tool (transducer) will be moved over your chest. The gel will help transmit the sound waves from the transducer. The sound waves will harmlessly bounce off your heart to allow the heart images to be captured in real-time motion. These images will then be recorded.  You may need an IV to receive a medicine that improves the quality of the pictures. AFTER THE PROCEDURE You may return to your normal schedule including diet, activities, and medicines, unless your health care provider tells you otherwise.   This information is not intended to replace  advice given to  you by your health care provider. Make sure you discuss any questions you have with your health care provider.   Document Released: 10/29/2000 Document Revised: 11/22/2014 Document Reviewed: 07/09/2013 Elsevier Interactive Patient Education 2016 [employer].   Exercise Stress Electrocardiogram An exercise stress electrocardiogram is a test that is done to evaluate the blood supply to your heart. This test may also be called exercise stress electrocardiography. The test is done while you are walking on a treadmill. The goal of this test is to raise your heart rate. This test is done to find areas of poor blood flow to the heart by determining the extent of coronary artery disease (CAD).   CAD is defined as narrowing in one or more heart (coronary) arteries of more than 70%. If you have an abnormal test result, this may mean that you are not getting adequate blood flow to your heart during exercise. Additional testing may be needed to understand why your test was abnormal. LET [facility] CARE PROVIDER KNOW ABOUT:   Any allergies you have.  All medicines you are taking, including vitamins, herbs, eye drops, creams, and over-the-counter medicines.  Previous problems you or members of your family have had with the use of anesthetics.  Any blood disorders you have.  Previous surgeries you have had.  Medical conditions you have.  Possibility of pregnancy, if this applies. RISKS AND COMPLICATIONS Generally, this is a safe procedure. However, as with any procedure, complications can occur. Possible complications can include:  Pain or pressure in the following areas:  Chest.  Jaw or neck.  Between your shoulder blades.  Radiating down your left arm.  Dizziness or light-headedness.  Shortness of breath.  Increased or irregular heartbeats.  Nausea or vomiting.  Heart attack (rare). BEFORE THE PROCEDURE  Avoid all forms of caffeine 24 hours before your test or as  directed by your health care provider. This includes coffee, tea (even decaffeinated tea), caffeinated sodas, chocolate, cocoa, and certain pain medicines.  Follow your health care provider's instructions regarding eating and drinking before the test.  Take your medicines as directed at regular times with water unless instructed otherwise. Exceptions may include:  If you have diabetes, ask how you are to take your insulin or pills. It is common to adjust insulin dosing the morning of the test.  If you are taking beta-blocker medicines, it is important to talk to your health care provider about these medicines well before the date of your test. Taking beta-blocker medicines may interfere with the test. In some cases, these medicines need to be changed or stopped 24 hours or more before the test.  If you wear a nitroglycerin patch, it may need to be removed prior to the test. Ask your health care provider if the patch should be removed before the test.  If you use an inhaler for any breathing condition, bring it with you to the test.  If you are an outpatient, bring a snack so you can eat right after the stress phase of the test.  Do not smoke for 4 hours prior to the test or as directed by your health care provider.  Do not apply lotions, powders, creams, or oils on your chest prior to the test.  Wear loose-fitting clothes and comfortable shoes for the test. This test involves walking on a treadmill. PROCEDURE  Multiple patches (electrodes) will be put on your chest. If needed, small areas of your chest may have to be shaved to get better contact with  the electrodes. Once the electrodes are attached to your body, multiple wires will be attached to the electrodes and your heart rate will be monitored.  Your heart will be monitored both at rest and while exercising.  You will walk on a treadmill. The treadmill will be started at a slow pace. The treadmill speed and incline will gradually be  increased to raise your heart rate. AFTER THE PROCEDURE  Your heart rate and blood pressure will be monitored after the test.  You may return to your normal schedule including diet, activities, and medicines, unless your health care provider tells you otherwise.   This information is not intended to replace advice given to you by your health care provider. Make sure you discuss any questions you have with your health care provider.   Document Released: 10/29/2000 Document Revised: 11/06/2013 Document Reviewed: 07/09/2013 Elsevier Interactive Patient Education [employer].  If you need a refill on your cardiac medications before your next appointment, please call your pharmacy.     Jarrett Soho, [place]  08/20/2016 4:06 PM    [facility] [place], [place], [place]  [address] Phone: [phone]; Fax: [phone]    The patient was seen, examined and discussed with Sy Saintjean PA-C and I agree with the above.   This is a pleasant 75 year old male with prior medical history of hypertension, hyperlipidemia, obstructive sleep apnea on CPAP machine, was been referred to [place] for value addition of worsening dyspnea on exertion. The patient has noticed worsening dyspnea over last 3 months to the point that he gets short of breath with minimal activity. He denies any orthopnea or proximal nocturnal dyspnea. He denies any palpitations or syncope. On physical exam he is bradycardic he has systolic murmur in the left lower sternal border, he is elevated JVDs +8 cm bilaterally, and mild pitting lower extremity edema bilaterally up to the midcalf.  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 We will also arrange for a stress test in about 2 weeks once he is compensated from his heart failure to evaluate for ischemia. We will decrease  diltiazem from 360-180 mg daily as he reports or metastatic hypotension especially when he stands up, we will add Lasix of 40 mg daily. We'll check CMP, BNP today.  Follow-up in 2 weeks.  [name] 08/20/2016

## 2016-08-20 NOTE — Patient Instructions (Addendum)
Medication Instructions:  Your physician has recommended you make the following change in your medication:  1.  DECREASE the Cardizem to 180 taking 1 tablet daily   Labwork: TODAY:  CMP, BNP, & CBC  Testing/Procedures: Your physician has requested that you have an echocardiogram. Echocardiography is a painless test that uses sound waves to create images of your heart. It provides your doctor with information about the size and shape of your heart and how well your heart's chambers and valves are working. This procedure takes approximately one hour. There are no restrictions for this procedure.   Your physician has requested that you have en exercise stress myoview. For further information please visit [email or web address]. Please follow instruction sheet, as given.  Follow-Up: Your physician recommends that you schedule a follow-up appointment in: 2 [place] APP ON HER TEAM   Any Other Special Instructions Will Be Listed Below (If Applicable).  Echocardiogram An echocardiogram, or echocardiography, uses sound waves (ultrasound) to produce an image of your heart. The echocardiogram is simple, painless, obtained within a short period of time, and offers valuable information to your health care provider. The images from an echocardiogram can provide information such as:  Evidence of coronary artery disease (CAD).  Heart size.  Heart muscle function.  Heart valve function.  Aneurysm detection.  Evidence of a past heart attack.  Fluid buildup around the heart.  Heart muscle thickening.  Assess heart valve function. LET [facility] CARE PROVIDER KNOW ABOUT:  Any allergies you have.  All medicines you are taking, including vitamins, herbs, eye drops, creams, and over-the-counter medicines.  Previous problems you or members of your family have had with the use of anesthetics.  Any blood disorders you have.  Previous surgeries you have had.  Medical  conditions you have.  Possibility of pregnancy, if this applies. BEFORE THE PROCEDURE  No special preparation is needed. Eat and drink normally.  PROCEDURE   In order to produce an image of your heart, gel will be applied to your chest and a wand-like tool (transducer) will be moved over your chest. The gel will help transmit the sound waves from the transducer. The sound waves will harmlessly bounce off your heart to allow the heart images to be captured in real-time motion. These images will then be recorded.  You may need an IV to receive a medicine that improves the quality of the pictures. AFTER THE PROCEDURE You may return to your normal schedule including diet, activities, and medicines, unless your health care provider tells you otherwise.   This information is not intended to replace advice given to you by your health care provider. Make sure you discuss any questions you have with your health care provider.   Document Released: 10/29/2000 Document Revised: 11/22/2014 Document Reviewed: 07/09/2013 Elsevier Interactive Patient Education 2016 [employer].   Exercise Stress Electrocardiogram An exercise stress electrocardiogram is a test that is done to evaluate the blood supply to your heart. This test may also be called exercise stress electrocardiography. The test is done while you are walking on a treadmill. The goal of this test is to raise your heart rate. This test is done to find areas of poor blood flow to the heart by determining the extent of coronary artery disease (CAD).   CAD is defined as narrowing in one or more heart (coronary) arteries of more than 70%. If you have an abnormal test result, this may mean that you are not getting adequate blood flow  to your heart during exercise. Additional testing may be needed to understand why your test was abnormal. LET [facility] CARE PROVIDER KNOW ABOUT:   Any allergies you have.  All medicines you are taking, including  vitamins, herbs, eye drops, creams, and over-the-counter medicines.  Previous problems you or members of your family have had with the use of anesthetics.  Any blood disorders you have.  Previous surgeries you have had.  Medical conditions you have.  Possibility of pregnancy, if this applies. RISKS AND COMPLICATIONS Generally, this is a safe procedure. However, as with any procedure, complications can occur. Possible complications can include:  Pain or pressure in the following areas:  Chest.  Jaw or neck.  Between your shoulder blades.  Radiating down your left arm.  Dizziness or light-headedness.  Shortness of breath.  Increased or irregular heartbeats.  Nausea or vomiting.  Heart attack (rare). BEFORE THE PROCEDURE  Avoid all forms of caffeine 24 hours before your test or as directed by your health care provider. This includes coffee, tea (even decaffeinated tea), caffeinated sodas, chocolate, cocoa, and certain pain medicines.  Follow your health care provider's instructions regarding eating and drinking before the test.  Take your medicines as directed at regular times with water unless instructed otherwise. Exceptions may include:  If you have diabetes, ask how you are to take your insulin or pills. It is common to adjust insulin dosing the morning of the test.  If you are taking beta-blocker medicines, it is important to talk to your health care provider about these medicines well before the date of your test. Taking beta-blocker medicines may interfere with the test. In some cases, these medicines need to be changed or stopped 24 hours or more before the test.  If you wear a nitroglycerin patch, it may need to be removed prior to the test. Ask your health care provider if the patch should be removed before the test.  If you use an inhaler for any breathing condition, bring it with you to the test.  If you are an outpatient, bring a snack so you can eat right  after the stress phase of the test.  Do not smoke for 4 hours prior to the test or as directed by your health care provider.  Do not apply lotions, powders, creams, or oils on your chest prior to the test.  Wear loose-fitting clothes and comfortable shoes for the test. This test involves walking on a treadmill. PROCEDURE  Multiple patches (electrodes) will be put on your chest. If needed, small areas of your chest may have to be shaved to get better contact with the electrodes. Once the electrodes are attached to your body, multiple wires will be attached to the electrodes and your heart rate will be monitored.  Your heart will be monitored both at rest and while exercising.  You will walk on a treadmill. The treadmill will be started at a slow pace. The treadmill speed and incline will gradually be increased to raise your heart rate. AFTER THE PROCEDURE  Your heart rate and blood pressure will be monitored after the test.  You may return to your normal schedule including diet, activities, and medicines, unless your health care provider tells you otherwise.   This information is not intended to replace advice given to you by your health care provider. Make sure you discuss any questions you have with your health care provider.   Document Released: 10/29/2000 Document Revised: 11/06/2013 Document Reviewed: 07/09/2013 Elsevier Interactive Patient  Education 2016 [employer].  If you need a refill on your cardiac medications before your next appointment, please call your pharmacy.

## 2016-08-21 LAB — BRAIN NATRIURETIC PEPTIDE: Brain Natriuretic Peptide: 48.7 pg/mL (ref <100)

## 2016-09-01 ENCOUNTER — Telehealth (HOSPITAL_COMMUNITY): Payer: Self-pay | Admitting: *Deleted

## 2016-09-01 NOTE — Telephone Encounter (Signed)
Patient given detailed instructions per Myocardial Perfusion Study Information Sheet for the test on  09/06/16. Patient notified to arrive 15 minutes early and that it is imperative to arrive on time for appointment to keep from having the test rescheduled.  If you need to cancel or reschedule your appointment, please call the office within 24 hours of your appointment. Failure to do so may result in a cancellation of your appointment, and a $50 no show fee. Patient verbalized understanding. Semaje Kinker Jacqueline     

## 2016-09-06 ENCOUNTER — Ambulatory Visit (HOSPITAL_COMMUNITY): Payer: Medicare Other | Attending: Cardiovascular Disease

## 2016-09-06 ENCOUNTER — Ambulatory Visit (HOSPITAL_BASED_OUTPATIENT_CLINIC_OR_DEPARTMENT_OTHER): Payer: Medicare Other

## 2016-09-06 ENCOUNTER — Other Ambulatory Visit: Payer: Self-pay

## 2016-09-06 DIAGNOSIS — R0600 Dyspnea, unspecified: Secondary | ICD-10-CM

## 2016-09-06 DIAGNOSIS — I351 Nonrheumatic aortic (valve) insufficiency: Secondary | ICD-10-CM | POA: Insufficient documentation

## 2016-09-06 DIAGNOSIS — R0602 Shortness of breath: Secondary | ICD-10-CM

## 2016-09-06 DIAGNOSIS — Z8249 Family history of ischemic heart disease and other diseases of the circulatory system: Secondary | ICD-10-CM | POA: Diagnosis not present

## 2016-09-06 DIAGNOSIS — G4733 Obstructive sleep apnea (adult) (pediatric): Secondary | ICD-10-CM | POA: Diagnosis not present

## 2016-09-06 DIAGNOSIS — I119 Hypertensive heart disease without heart failure: Secondary | ICD-10-CM | POA: Insufficient documentation

## 2016-09-06 DIAGNOSIS — R0609 Other forms of dyspnea: Secondary | ICD-10-CM

## 2016-09-06 DIAGNOSIS — R0789 Other chest pain: Secondary | ICD-10-CM

## 2016-09-06 DIAGNOSIS — I340 Nonrheumatic mitral (valve) insufficiency: Secondary | ICD-10-CM | POA: Diagnosis not present

## 2016-09-06 LAB — MYOCARDIAL PERFUSION IMAGING
LV dias vol: 138 mL (ref 62–150)
LV sys vol: 66 mL
Peak HR: 78 {beats}/min
RATE: 0.28
Rest HR: 50 {beats}/min
SDS: 0
SRS: 2
SSS: 2
TID: 0.95

## 2016-09-06 MED ORDER — TECHNETIUM TC 99M TETROFOSMIN IV KIT
10.2000 | PACK | Freq: Once | INTRAVENOUS | Status: AC | PRN
Start: 2016-09-06 — End: 2016-09-06
  Administered 2016-09-06: 10.2 via INTRAVENOUS
  Filled 2016-09-06: qty 11

## 2016-09-06 MED ORDER — REGADENOSON 0.4 MG/5ML IV SOLN
0.4000 mg | Freq: Once | INTRAVENOUS | Status: AC
  Administered 2016-09-06: 0.4 mg via INTRAVENOUS

## 2016-09-06 MED ORDER — TECHNETIUM TC 99M TETROFOSMIN IV KIT
31.3000 | PACK | Freq: Once | INTRAVENOUS | Status: AC | PRN
  Administered 2016-09-06: 31.3 via INTRAVENOUS
  Filled 2016-09-06: qty 32

## 2016-09-08 ENCOUNTER — Telehealth: Payer: Self-pay | Admitting: Cardiology

## 2016-09-08 NOTE — Telephone Encounter (Signed)
New Message ° °Pt call requesting to speak with RN about test results. Please call back to discuss  °

## 2016-09-08 NOTE — Telephone Encounter (Signed)
Pt aware of his stress test and echo results and he confirmed his fu appt 09/10/16 with dd.

## 2016-09-10 ENCOUNTER — Ambulatory Visit (INDEPENDENT_AMBULATORY_CARE_PROVIDER_SITE_OTHER): Payer: Medicare Other | Admitting: Physician Assistant

## 2016-09-10 ENCOUNTER — Encounter: Payer: Self-pay | Admitting: Physician Assistant

## 2016-09-10 VITALS — BP 168/82 | HR 48 | Resp 16 | Ht 68.0 in | Wt 205.0 lb

## 2016-09-10 DIAGNOSIS — I10 Essential (primary) hypertension: Secondary | ICD-10-CM | POA: Diagnosis not present

## 2016-09-10 DIAGNOSIS — R0602 Shortness of breath: Secondary | ICD-10-CM

## 2016-09-10 DIAGNOSIS — R001 Bradycardia, unspecified: Secondary | ICD-10-CM | POA: Diagnosis not present

## 2016-09-10 DIAGNOSIS — R42 Dizziness and giddiness: Secondary | ICD-10-CM

## 2016-09-10 DIAGNOSIS — R600 Localized edema: Secondary | ICD-10-CM

## 2016-09-10 DIAGNOSIS — K224 Dyskinesia of esophagus: Secondary | ICD-10-CM | POA: Insufficient documentation

## 2016-09-10 DIAGNOSIS — G4730 Sleep apnea, unspecified: Secondary | ICD-10-CM | POA: Insufficient documentation

## 2016-09-10 DIAGNOSIS — K222 Esophageal obstruction: Secondary | ICD-10-CM | POA: Insufficient documentation

## 2016-09-10 DIAGNOSIS — E785 Hyperlipidemia, unspecified: Secondary | ICD-10-CM | POA: Insufficient documentation

## 2016-09-10 DIAGNOSIS — K219 Gastro-esophageal reflux disease without esophagitis: Secondary | ICD-10-CM | POA: Insufficient documentation

## 2016-09-10 MED ORDER — AMLODIPINE BESYLATE 10 MG PO TABS: 10.0000 mg | ORAL_TABLET | Freq: Every day | ORAL | 1 refills | Status: DC

## 2016-09-10 NOTE — Patient Instructions (Addendum)
Medication Instructions:   STOP TAKING DILTIAZEM  180 MG   START TAKING AMLODIPINE 10 MG ONCE A DAY    If you need a refill on your cardiac medications before your next appointment, please call your pharmacy.  Labwork: TSH TODAY    Testing/Procedures: NONE ORDER TODAY    Follow-Up:  1. IN THE MIDDLE OF NEXT WEEK FOR BP CHECK  PHAR D / NURSE (VISIT )                       2.  6 WEEKS WITH DANA DUNN    Any Other Special Instructions Will Be Listed Below (If Applicable).  CHECK BLOOD PRESSURE OVER THE WEEKEND AND CALL  CLINIC IF RUNNING OVER 140 ON TOP

## 2016-09-10 NOTE — Progress Notes (Signed)
Cardiology Office Note    Date:  09/10/2016  ID:  Bob Elliott, DOB [birth year], MRN [id number] PCP:  [name], MD  Cardiologist:  Dr. Meda Coffee   Chief Complaint: f/u SOB  History of Present Illness:  Bob Elliott is a 75 y.o. male with history of HTN, hyperlipidemia, GERD, esophageal dysmotility, GI bleed, Schatzki's ring, BPH, mild sleep apnea 2005 (not on CPAP) presents for f/u of SOB.   Has history of atypical chest pain in 2009, ruled out for MI, CTA neg for PE. He was recently seen in the office 08/20/16 with complaints of exertional dyspnea for the past 2-3 months as well as some dizziness. He was felt to be volume overloaded with edema on exam - the note indicates recommendation to start Lasix 40mg  daily for concern for right heart failure but I do not see that this was prescribed. He also had some dizziness with sinus bradycardia/1st degree AVB so Cardizem was decreased. CMET 08/20/16 showed CO2 32, BUN 13, Cr 1.13, albumin 3.9, Hgb 15.4, BNP 48. 2D echo 09/06/16: mild LVH of septum, mod hypertrophy of posterior wall, EF 55-60%, grade 1 DD, mild AI, trivial MR, mild RAE. Nuc 09/06/16: inferior and apical thinning consistent with soft tissue attenuation of diaphragm, cannot exclude small subendocardial scar, no ischemia, EF 53%. Last CXR 02/2016 (prior to recent eval) showed stable cardiomegaly without CHF, mild pulmonary interstitial prominence more conspicuous than in 2011 may reflect acute on chronic bronchitic change. 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  He returns for follow-up today. As above, he did not recall getting a prescription for Lasix. He denies any LEE. He ambulated today briskly in clinic and had no further DOE. However, pulse notably did not rise much - 55 to 57bpm with ambulation, pulse ox 96%. He has some dyspnea when he lays on his left side. He also has some dizziness. No syncope or falls. He  also reports having a headache with his elevated blood pressure. No focal neuro sx. He has followed BP at home and reports it has been running similar to the reading he got here today.   Past Medical History:  Diagnosis Date  . Arthritis   . BPH (benign prostatic hyperplasia)   . Esophageal dysmotility   . GERD (gastroesophageal reflux disease)   . GI bleed   . Glaucoma    BOTH EYES  . Hyperlipidemia   . Hypertension   . Schatzki's ring   . Sleep apnea    "mild" no cpap    Past Surgical History:  Procedure Laterality Date  . COLONOSCOPY WITH PROPOFOL N/A 05/11/2016   Procedure: COLONOSCOPY WITH PROPOFOL;  Surgeon: Garlan Fair, MD;  Location: [facility];  Service: Endoscopy;  Laterality: N/A;  . EYE SURGERY     CATARACT R  EYE  . HERNIA REPAIR     UMBILICAL  . INGUINAL HERNIA REPAIR Right 06/18/2014   Procedure: RIGHT INGUINAL HERNIA REPAIR;  Surgeon: Odis Hollingshead, MD;  Location: [facility];  Service: General;  Laterality: Right;  . INSERTION OF MESH Right 06/18/2014   Procedure: INSERTION OF MESH;  Surgeon: Odis Hollingshead, MD;  Location: [facility];  Service: General;  Laterality: Right;  . NECK SURGERY  2000   cervical disc surgery(limited ROM)- retained hardware  . TONSILLECTOMY      Current Medications: Current Outpatient Prescriptions  Medication Sig Dispense Refill  .  colchicine 0.6 MG tablet Take 0.6 mg by mouth daily as needed (gout).    . cyclobenzaprine (FLEXERIL) 5 MG tablet Take 5 mg by mouth 3 times/day as needed-between meals & bedtime for muscle spasms.    [name] diltiazem (DILACOR XR) 180 MG 24 hr capsule Take 1 capsule (180 mg total) by mouth every morning. 90 capsule 3  . doxazosin (CARDURA) 4 MG tablet Take 4 mg by mouth daily.     [name] esomeprazole (NEXIUM) 40 MG capsule Take 40 mg by mouth daily as needed (for acid reflux).     . fluticasone (FLONASE) 50 MCG/ACT nasal spray Place 2 sprays into both nostrils daily as needed for allergies or rhinitis.     [name]  loratadine (CLARITIN) 10 MG tablet Take 10 mg by mouth daily.    . naproxen sodium (ANAPROX) 220 MG tablet Take 220 mg by mouth daily as needed (pain).    . potassium chloride (MICRO-K) 10 MEQ CR capsule Take 20 mEq by mouth 2 (two) times daily.     . timolol (TIMOPTIC) 0.5 % ophthalmic solution Place 1 drop into both eyes daily.    . trandolapril (MAVIK) 4 MG tablet Take 4 mg by mouth 2 (two) times daily.      No current facility-administered medications for this visit.      Allergies:   Aspirin; Codeine; and Shellfish allergy   Social History   Social History  . Marital status: Married    Spouse name: N/A  . Number of children: 4  . Years of education: N/A   Occupational History  . retired    Social History Main Topics  . Smoking status: Never Smoker  . Smokeless tobacco: Never Used  . Alcohol use Yes     Comment: none since 80's  . Drug use: No  . Sexual activity: Not Currently   Other Topics Concern  . None   Social History Narrative  . None     Family History:  The patient's family history includes Cancer in his brother; Diabetes in his sister; Heart disease in his mother; Leukemia in his maternal grandmother; Stroke in his father.   ROS:   Please see the history of present illness. All other systems are reviewed and otherwise negative.    PHYSICAL EXAM:   VS:  BP (!) 168/82   Pulse (!) 48   Resp 16   Ht 5\' 8"  (1.727 m)   Wt 205 lb (93 kg)   SpO2 96%   BMI 31.17 kg/m   BMI: Body mass index is 31.17 kg/m. GEN: Well nourished, well developed M, in no acute distress  HEENT: normocephalic, atraumatic Neck: no JVD, carotid bruits, or masses Cardiac: Bradycardic, regular, no murmurs, rubs, or gallops, no edema  Respiratory:  clear to auscultation bilaterally, normal work of breathing GI: soft, nontender, nondistended, + BS MS: no deformity or atrophy  Skin: warm and dry, no rash Neuro:  Alert and Oriented x 3, Strength and sensation are intact, follows  commands. No focal deficit Psych: euthymic mood, full affect  Wt Readings from Last 3 Encounters:  09/10/16 205 lb (93 kg)  09/06/16 201 lb (91.2 kg)  08/20/16 201 lb 1.9 oz (91.2 kg)      Studies/Labs Reviewed:   EKG:  EKG was ordered today and personally reviewed by me and demonstrates sinus bradycardia 48bpm, first degree AVB, TWI avL  Recent Labs: 08/20/2016: ALT 11; Brain Natriuretic Peptide 48.7; BUN 13; Creat 1.13; Hemoglobin 15.4; Platelets 192; Potassium  3.9; Sodium 140   Lipid Panel   Additional studies/ records that were reviewed today include: Summarized above.    ASSESSMENT & PLAN:   1. Shortness of breath - exertional component has improved with decrease in diltiazem. Recent labwork did not show any evidence of CHF. Nuclear stress test did not show any ischemia. He denies any edema. Will continue to work on BP control for now as below. If he has recurrent DOE, may need to consider ETT to assess chronotropic continence off AVN blocking agents. 2. Dizziness - question related to low HR or high blood pressure. Will stop diltiazem and change to amlodipine today. Follow with these changes. 3. Sinus bradycardia - see above. Will also check TSH. 4. Lower extremity edema - not apparent on exam today. Recent BNP normal. 5. Essential HTN - as above, will switch diltiazem to amlodipine 10mg  daily. I asked the patient to follow his BP daily on this medicine. If his BP remains 0000000 systolic 3 days after initiation, I asked him to call the office at which time I would probably consider initiation of HCTZ or chlorthalidone with f/u of BMET a week after initiation. He is already on max dose of trandalopril. He is not terribly excited about being on a diuretic given history of nocturia, but is willing to do so if needed. Our other alternative would be hydralazine but this would come with TID dosing. Will also schedule nurse BP check next week.  Disposition: F/u with nurse blood pressure  check middle of next week and f/u with me in 6 weeks.   Medication Adjustments/Labs and Tests Ordered: Current medicines are reviewed at length with the patient today.  Concerns regarding medicines are outlined above. Medication changes, Labs and Tests ordered today are summarized above and listed in the Patient Instructions accessible in Encounters.   Raechel Ache PA-C  09/10/2016 1:41 PM    [facility] [place], [place], [place]  [address] Phone: [phone]; Fax: [phone]

## 2016-09-11 LAB — TSH: TSH: 1.91 mIU/L (ref 0.40–4.50)

## 2016-09-12 ENCOUNTER — Telehealth: Payer: Self-pay | Admitting: Physician Assistant

## 2016-09-12 ENCOUNTER — Other Ambulatory Visit: Payer: Self-pay | Admitting: Physician Assistant

## 2016-09-12 DIAGNOSIS — I509 Heart failure, unspecified: Secondary | ICD-10-CM

## 2016-09-12 MED ORDER — HYDROCHLOROTHIAZIDE 12.5 MG PO CAPS: 12.5000 mg | ORAL_CAPSULE | Freq: Every day | ORAL | 5 refills | Status: DC

## 2016-09-12 NOTE — Telephone Encounter (Signed)
Patient called after hour answering service with blood pressure of 168, I will add HCTZ 12.5mg  daily. He is going to visit hour HTN clinic next Wednesday, if BP still high, can uptitrate HCTZ dose, otherwise, he will need a BMET next Friday.   Hilbert Corrigan PA Pager: [phone]

## 2016-09-13 NOTE — Telephone Encounter (Signed)
Do you want me to go ahead and put the BMET order in?

## 2016-09-14 NOTE — Telephone Encounter (Signed)
Yes please. Need to check BMET this Friday

## 2016-09-15 ENCOUNTER — Other Ambulatory Visit: Payer: Medicare Other | Admitting: *Deleted

## 2016-09-15 ENCOUNTER — Encounter: Payer: Self-pay | Admitting: Pharmacist

## 2016-09-15 ENCOUNTER — Ambulatory Visit (INDEPENDENT_AMBULATORY_CARE_PROVIDER_SITE_OTHER): Payer: Medicare Other | Admitting: Pharmacist

## 2016-09-15 VITALS — BP 152/86 | HR 73

## 2016-09-15 DIAGNOSIS — I509 Heart failure, unspecified: Secondary | ICD-10-CM | POA: Diagnosis not present

## 2016-09-15 DIAGNOSIS — I10 Essential (primary) hypertension: Secondary | ICD-10-CM

## 2016-09-15 LAB — BASIC METABOLIC PANEL WITH GFR
BUN: 16 mg/dL (ref 7–25)
CO2: 31 mmol/L (ref 20–31)
Calcium: 9 mg/dL (ref 8.6–10.3)
Chloride: 100 mmol/L (ref 98–110)
Creat: 1.22 mg/dL — ABNORMAL HIGH (ref 0.70–1.18)
GFR, Est African American: 66 mL/min (ref >=60)
GFR, Est Non African American: 57 mL/min — ABNORMAL LOW (ref >=60)
Glucose, Bld: 86 mg/dL (ref 65–99)
Potassium: 3.1 mmol/L — ABNORMAL LOW (ref 3.5–5.3)
Sodium: 141 mmol/L (ref 135–146)

## 2016-09-15 NOTE — Patient Instructions (Addendum)
Return for a a follow up appointment in 2 weeks   Check your blood pressure at home daily (if able) and keep record of the readings.  Take your BP meds as follows: Continue medications as prescribed  Bring all of your meds, your BP cuff and your record of home blood pressures to your next appointment.  Exercise as you're able, try to walk approximately 30 minutes per day.  Keep salt intake to a minimum, especially watch canned and prepared boxed foods.  Eat more fresh fruits and vegetables and fewer canned items.  Avoid eating in fast food restaurants.    HOW TO TAKE YOUR BLOOD PRESSURE: . Rest 5 minutes before taking your blood pressure. .  Don't smoke or drink caffeinated beverages for at least 30 minutes before. . Take your blood pressure before (not after) you eat. . Sit comfortably with your back supported and both feet on the floor (don't cross your legs). . Elevate your arm to heart level on a table or a desk. . Use the proper sized cuff. It should fit smoothly and snugly around your bare upper arm. There should be enough room to slip a fingertip under the cuff. The bottom edge of the cuff should be 1 inch above the crease of the elbow. . Ideally, take 3 measurements at one sitting and record the average.

## 2016-09-15 NOTE — Progress Notes (Signed)
Patient ID: Bob Elliott                 DOB: 12/28/1940                      MRN: [id number]     HPI: Bob Elliott is a 75 y.o. male patient of Dr. Meda Coffee with [facility] who presents today for hypertension evaluation. His diltiazem was recently changed to amlodipine 10mg  daily last week and started on HCTZ 12.5mg  two days ago.   He states he has done well on the new medications though he has had a headache after the change to amlodipine. He states he has been feeling a lot better since Sunday. He states the first day he took HCTZ he went to the restroom several times that evening but since has only been 1 time per night which is typical for him.  Cardiac Hx: HTN, sleep apnea, HLD  Current HTN meds:  Amlodipine 10mg  daily QAM Doxazosin 4mg  daily QPM Hydrochlorothiazide 12.5mg  daily Trandolapril 4mg  BID  Previously tried:  Diltiazem - HR low He states he has previously tried several medications that did not work as well. He also states he was on one medication that controlled his pressure but caused him "problems with the ladies."  BP goal: <150/90  Family History: His father passed away from a stroke. His mother passed from CHF. He has a sister with DM.   Social History: Denies tobacco. He states he used to be heavy drinker but has not had a drink since 1984.   Diet: He eats most of his meals from home. He occasionally adds salt when cooking but never at the table. He drinks about 3/4 a cup of coffee per day. He also occasionally drinks diet soda.   Exercise: He has not exercised since the medication change due to the headache but states he plans to today since he feels better.   Home BP readings:  BP mostly 140-150s/80-90s - trending down. The last 2 days were low140s/high80s since starting HCTZ on Monday.   Wt Readings from Last 3 Encounters:  09/10/16 205 lb (93 kg)  09/06/16 201 lb (91.2 kg)  08/20/16 201 lb 1.9 oz (91.2 kg)   BP Readings from Last 3 Encounters:  09/15/16  (!) 152/86  09/10/16 (!) 168/82  08/20/16 (!) 136/58   Pulse Readings from Last 3 Encounters:  09/15/16 73  09/10/16 (!) 48  08/20/16 (!) 55    Renal function: CrCl cannot be calculated (Patient's most recent lab result is older than the maximum 21 days allowed.).  Past Medical History:  Diagnosis Date  . Arthritis   . BPH (benign prostatic hyperplasia)   . Esophageal dysmotility   . GERD (gastroesophageal reflux disease)   . GI bleed   . Glaucoma    BOTH EYES  . Hyperlipidemia   . Hypertension   . Schatzki's ring   . Sleep apnea    "mild" no cpap    Current Outpatient Prescriptions on File Prior to Visit  Medication Sig Dispense Refill  . amLODipine (NORVASC) 10 MG tablet Take 1 tablet (10 mg total) by mouth daily. 90 tablet 1  . colchicine 0.6 MG tablet Take 0.6 mg by mouth daily as needed (gout).    [name] doxazosin (CARDURA) 4 MG tablet Take 4 mg by mouth daily.     [name] esomeprazole (NEXIUM) 40 MG capsule Take 40 mg by mouth daily as needed (for acid reflux).     [name]  fluticasone (FLONASE) 50 MCG/ACT nasal spray Place 2 sprays into both nostrils daily as needed for allergies or rhinitis.     . hydrochlorothiazide (MICROZIDE) 12.5 MG capsule Take 1 capsule (12.5 mg total) by mouth daily. 30 capsule 5  . loratadine (CLARITIN) 10 MG tablet Take 10 mg by mouth daily.    . potassium chloride (MICRO-K) 10 MEQ CR capsule Take 20 mEq by mouth 2 (two) times daily.     . timolol (TIMOPTIC) 0.5 % ophthalmic solution Place 1 drop into both eyes daily.    . trandolapril (MAVIK) 4 MG tablet Take 4 mg by mouth 2 (two) times daily.     . cyclobenzaprine (FLEXERIL) 5 MG tablet Take 5 mg by mouth 3 times/day as needed-between meals & bedtime for muscle spasms.     No current facility-administered medications on file prior to visit.     Allergies  Allergen Reactions  . Aspirin Hives, Itching and Nausea Only  . Codeine Hives, Itching and Nausea Only  . Shellfish Allergy Hives    Blood  pressure (!) 152/86, pulse 73, SpO2 95 %.   Assessment/Plan: Hypertension: BP today borderline at goal. Since he was just started on HCTZ about 2 days ago and amlodipine about 5 days ago and pressures are trending down, will defer further medication changes at this time. He will get a BMET today as directed by Bob Deforest, PA. Have asked he continue to record pressures and follow up in 2 weeks for additional medication titration and BMET.    Thank you, Lelan Pons. Patterson Hammersmith, [facility]  09/15/2016 9:29 AM

## 2016-09-15 NOTE — Telephone Encounter (Signed)
Pt seeing pharmacy at [address] today at 9 am. Will call him to have BMET done on 09/17/16.

## 2016-09-15 NOTE — Addendum Note (Signed)
Addended by: Therisa Doyne on: 09/15/2016 09:48 AM   Modules accepted: Orders

## 2016-09-15 NOTE — Telephone Encounter (Signed)
Spoke to pt and informed him to have labwork done on Friday 09/17/16. Pt stated he wanted to have it done today because he was already in the office. I contacted Almyra Deforest, PA who stated it has only been 3 days since he started the medication which is why the labwork was delayed to Friday. But if the pt did not want to run twice, he was fine with it. I informed the pt of this and he said he is going to get the labwork done today.

## 2016-09-17 ENCOUNTER — Telehealth: Payer: Self-pay | Admitting: Physician Assistant

## 2016-09-17 DIAGNOSIS — E876 Hypokalemia: Secondary | ICD-10-CM

## 2016-09-17 MED ORDER — POTASSIUM CHLORIDE ER 10 MEQ PO CPCR: 20.0000 meq | ORAL_CAPSULE | Freq: Two times a day (BID) | ORAL | 1 refills | Status: DC

## 2016-09-17 NOTE — Telephone Encounter (Signed)
Fu Message ° °Pt states he is returning RN call. Please call back to discuss  °

## 2016-09-17 NOTE — Telephone Encounter (Signed)
[place], [place] spoke to pt. Pt is currently taking 20 mEq KCl twice daily. Hao recommended increasing to 40 mEq in the morning and 20 mEq in the evening and repeat BMET in 1 week. Pt was agreeable to this, but specified that potassium be in capsule form because he cannot take the tablets and he will not pick it up from the pharmacy if not in capsule form.   Unfortunately, the 20 mEqs do not come in capsule form. Hao recommended stopping the HCTZ and discussing alternative medication with the pharmacist. He also informed patient to take 60 mEq of KCl today and tomorrow, then continue with normal dose (20 mEq in the AM and 20 mEq in PM). Patient is agreeable to this. Order for BMET placed, to be done on Wednesday 09/22/16 at [facility] office. Pt aware.

## 2016-09-17 NOTE — Telephone Encounter (Signed)
-----   Message from [place], [place] sent at 09/15/2016  9:14 PM EDT ----- Kidney function did worsen slightly, also he is losing more potassium than I expected. Recommend start 97meq of KCl supplement and recheck BMET in 1 week

## 2016-09-17 NOTE — Telephone Encounter (Signed)
Returned call to pt. Wanted to know if he needed an appointment to get labwork at [address]. Informed patient he did not need an appointment and that the lab is open from 8-4:30, closed from 12:30-1 for lunch, and that he can go anytime during those hours. Order for labwork is in the system.

## 2016-09-20 DIAGNOSIS — H40012 Open angle with borderline findings, low risk, left eye: Secondary | ICD-10-CM | POA: Diagnosis not present

## 2016-09-20 DIAGNOSIS — H40052 Ocular hypertension, left eye: Secondary | ICD-10-CM | POA: Diagnosis not present

## 2016-09-20 DIAGNOSIS — H40011 Open angle with borderline findings, low risk, right eye: Secondary | ICD-10-CM | POA: Diagnosis not present

## 2016-09-20 DIAGNOSIS — H40051 Ocular hypertension, right eye: Secondary | ICD-10-CM | POA: Diagnosis not present

## 2016-09-22 ENCOUNTER — Other Ambulatory Visit: Payer: Medicare Other | Admitting: *Deleted

## 2016-09-22 DIAGNOSIS — E876 Hypokalemia: Secondary | ICD-10-CM | POA: Diagnosis not present

## 2016-09-22 LAB — BASIC METABOLIC PANEL WITH GFR
BUN: 11 mg/dL (ref 7–25)
CO2: 27 mmol/L (ref 20–31)
Calcium: 8.8 mg/dL (ref 8.6–10.3)
Chloride: 104 mmol/L (ref 98–110)
Creat: 1.03 mg/dL (ref 0.70–1.18)
GFR, Est African American: 81 mL/min (ref >=60)
GFR, Est Non African American: 70 mL/min (ref >=60)
Glucose, Bld: 97 mg/dL (ref 65–99)
Potassium: 3.5 mmol/L (ref 3.5–5.3)
Sodium: 141 mmol/L (ref 135–146)

## 2016-09-29 NOTE — Progress Notes (Signed)
Patient ID: PRYNCETON HOLTZINGER                 DOB: December 20, 1940                      MRN: [id number]     HPI: Bob Elliott is a 75 y.o. male patient of Dr. Meda Coffee presents to clinic for a HTN follow-up. PMH of HTN, sleep apnea, BPH, and HLD. At last pharmacy visit on 11/01, patient was close to goal of <150/90 mmHg with a BP of 152/86 mmHg on amlodipine 10 mg daily, trandolapril 4 mg BID, doxazosin 4 mg daily and HCTZ 12.5 mg daily (on it for only 2 days). Since BP was trending down, no changes were made to his medication regimen. However, HCTZ was d/c'd due to increase in SCr 1.22 and decline in potassium (3.1) requiring KCl supplementation.  Patient reports measuring BP at home twice daily. He reports stable readings of 135-140/70-80s mmHg. He is tolerating his medications well and denies CP, SOB, faintness, dizziness and blurred vision. He confirms discontinuation of HCTZ as instructed and states he experienced symptoms of weakness and inability to stand while on it. He also reports a diagnosis of BPH and takes doxazosin for this.  Current HTN meds: amlodipine 10 mg daily and trandolapril 4 mg BID, doxazosin 4 mg daily Previously tried: HCTZ 12.5 mg daily (renal function), Diltiazem (low HR) BP goal:  <150/90 mmHg  Family History: His father passed away from a stroke. His mother passed from CHF. He has a sister with DM.   Social History: Denies tobacco. He states he used to be heavy drinker but has not had a drink since 1984.   Diet: He eats most of his meals from home. He occasionally adds salt when cooking but never at the table. He drinks about 3/4 a cup of coffee per day. He also occasionally drinks diet soda.   Exercise: He has not exercised since the medication change due to the headache but states he plans to today since he feels better.  Home BP readings: 135-140/70-80s mmHg  Wt Readings from Last 3 Encounters:  09/10/16 205 lb (93 kg)  09/06/16 201 lb (91.2 kg)  08/20/16 201 lb 1.9 oz  (91.2 kg)   BP Readings from Last 3 Encounters:  09/15/16 (!) 152/86  09/10/16 (!) 168/82  08/20/16 (!) 136/58   Pulse Readings from Last 3 Encounters:  09/15/16 73  09/10/16 (!) 48  08/20/16 (!) 55    Renal function: CrCl cannot be calculated (Unknown ideal weight.).  Past Medical History:  Diagnosis Date  . Arthritis   . BPH (benign prostatic hyperplasia)   . Esophageal dysmotility   . GERD (gastroesophageal reflux disease)   . GI bleed   . Glaucoma    BOTH EYES  . Hyperlipidemia   . Hypertension   . Schatzki's ring   . Sleep apnea    "mild" no cpap    Current Outpatient Prescriptions on File Prior to Visit  Medication Sig Dispense Refill  . amLODipine (NORVASC) 10 MG tablet Take 1 tablet (10 mg total) by mouth daily. 90 tablet 1  . colchicine 0.6 MG tablet Take 0.6 mg by mouth daily as needed (gout).    . cyclobenzaprine (FLEXERIL) 5 MG tablet Take 5 mg by mouth 3 times/day as needed-between meals & bedtime for muscle spasms.    [name] doxazosin (CARDURA) 4 MG tablet Take 4 mg by mouth daily.     [name]  esomeprazole (NEXIUM) 40 MG capsule Take 40 mg by mouth daily as needed (for acid reflux).     . fluticasone (FLONASE) 50 MCG/ACT nasal spray Place 2 sprays into both nostrils daily as needed for allergies or rhinitis.     [name] loratadine (CLARITIN) 10 MG tablet Take 10 mg by mouth daily.    . potassium chloride (MICRO-K) 10 MEQ CR capsule Take 2 capsules (20 mEq total) by mouth 2 (two) times daily. 180 capsule 1  . timolol (TIMOPTIC) 0.5 % ophthalmic solution Place 1 drop into both eyes daily.    . trandolapril (MAVIK) 4 MG tablet Take 4 mg by mouth 2 (two) times daily.      No current facility-administered medications on file prior to visit.     Allergies  Allergen Reactions  . Aspirin Hives, Itching and Nausea Only  . Codeine Hives, Itching and Nausea Only  . Shellfish Allergy Hives     Assessment/Plan:  1. Hypertension - Patient's blood pressure was 140/1mmHg in  clinic today which is within the goal of <150/90 mmHg. Will continue with current antihypertensive medication regimen. No follow-up is necessary. Advised patient to call the clinic with any questions or concerns.  Patient seen by Leroy Libman, [employer] pharmacy student.   Bodhi Moradi E. Laban Orourke, PharmD, [facility] [id number] [place], [place] [address] Phone: [phone]; Fax: [phone] 09/30/2016 11:32 AM

## 2016-09-30 ENCOUNTER — Ambulatory Visit (INDEPENDENT_AMBULATORY_CARE_PROVIDER_SITE_OTHER): Payer: Medicare Other | Admitting: Pharmacist

## 2016-09-30 ENCOUNTER — Encounter: Payer: Self-pay | Admitting: Pharmacist

## 2016-09-30 VITALS — BP 140/62 | HR 78

## 2016-09-30 DIAGNOSIS — I10 Essential (primary) hypertension: Secondary | ICD-10-CM | POA: Diagnosis not present

## 2016-09-30 NOTE — Patient Instructions (Signed)
Continue taking current medications for blood pressure.   Continue checking your blood pressure at home.   No follow-up necessary. Call the clinic with any questions, concerns or appointment requests.

## 2016-10-11 DIAGNOSIS — H40051 Ocular hypertension, right eye: Secondary | ICD-10-CM | POA: Diagnosis not present

## 2016-10-21 DIAGNOSIS — R0600 Dyspnea, unspecified: Secondary | ICD-10-CM | POA: Insufficient documentation

## 2016-10-21 DIAGNOSIS — R0609 Other forms of dyspnea: Secondary | ICD-10-CM | POA: Insufficient documentation

## 2016-10-21 DIAGNOSIS — R001 Bradycardia, unspecified: Secondary | ICD-10-CM | POA: Insufficient documentation

## 2016-10-21 DIAGNOSIS — R42 Dizziness and giddiness: Secondary | ICD-10-CM | POA: Insufficient documentation

## 2016-10-21 HISTORY — DX: Dizziness and giddiness: R42

## 2016-10-21 NOTE — Progress Notes (Signed)
Cardiology Office Note    Date:  10/22/2016  ID:  Bob Elliott, DOB 1941/10/31, MRN [id number] PCP:  [name], MD  Cardiologist:  Dr. Meda Coffee   Chief Complaint: follow up BP  History of Present Illness:  Bob Elliott is a 75 y.o. male with history of HTN, hyperlipidemia, GERD, esophageal dysmotility, GI bleed, Schatzki's ring, BPH, mild sleep apnea 2005 (not on CPAP) presents for f/u of SOB. 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   Has history of atypical chest pain in 2009, ruled out for MI, CTA neg for PE. He was recently seen in the office 08/20/16 with complaints of exertional dyspnea for the past 2-3 months as well as some dizziness. He was felt to be volume overloaded with edema on exam - the note indicates recommendation to start Lasix 40mg  daily for concern for right heart failure but it does not appear this was actually prescribed. He also had some dizziness with sinus bradycardia/1st degree AVB so Cardizem was decreased. CMET 08/20/16 showed CO2 32, BUN 13, Cr 1.13, albumin 3.9, Hgb 15.4, BNP 48. 2D echo 09/06/16: mild LVH of septum, mod hypertrophy of posterior wall, EF 55-60%, grade 1 DD, mild AI, trivial MR, mild RAE. Nuc 09/06/16: inferior and apical thinning consistent with soft tissue attenuation of diaphragm, cannot exclude small subendocardial scar, no ischemia, EF 53%. CXR 02/2016 (prior to recent eval) showed stable cardiomegaly without CHF, mild pulmonary interstitial prominence more conspicuous than in 2011 may reflect acute on chronic bronchitic change. He returned for follow-up 09/10/16 at which time he denied any edema. He ambulated briskly in clinic without further DOE. However, pulse notably did not rise much - 55 to 57bpm with ambulation, pulse ox 96%. His blood pressure was running on the higher side [id number] systolic. Given question of chronotropic incompetence & high BP contributing to his  symptoms, his diltiazem was changed to amlodipine. He has since followed up via phone notes and pharmD clinic. HCTZ was added then discontinued due to risk in SCr and hypokalemia. Subsequent BP had improved on the swap to max dose amlodipine.  He returns for clinic today and denies any symptoms at all. No SOB, chest pain, palpitations, edema. He watches his sodium intake. He does admit to occasionally indulging in potato chips or [employer] french fries. He no longer notices significant dizziness - only rare occasions when standing up too quickly. His blood pressure is tending to run 124-130/76-80 at home per his report.    Past Medical History:  Diagnosis Date  . Arthritis   . BPH (benign prostatic hyperplasia)   . Esophageal dysmotility   . GERD (gastroesophageal reflux disease)   . GI bleed   . Glaucoma    BOTH EYES  . Hyperlipidemia   . Hypertension   . Schatzki's ring   . Sleep apnea    "mild" no cpap    Past Surgical History:  Procedure Laterality Date  . COLONOSCOPY WITH PROPOFOL N/A 05/11/2016   Procedure: COLONOSCOPY WITH PROPOFOL;  Surgeon: Garlan Fair, MD;  Location: [facility];  Service: Endoscopy;  Laterality: N/A;  . EYE SURGERY     CATARACT R  EYE  . HERNIA REPAIR     UMBILICAL  . INGUINAL HERNIA REPAIR Right 06/18/2014   Procedure: RIGHT INGUINAL HERNIA REPAIR;  Surgeon: Odis Hollingshead, MD;  Location: [facility];  Service: General;  Laterality:  Right;  [name] INSERTION OF MESH Right 06/18/2014   Procedure: INSERTION OF MESH;  Surgeon: Odis Hollingshead, MD;  Location: [facility];  Service: General;  Laterality: Right;  . NECK SURGERY  2000   cervical disc surgery(limited ROM)- retained hardware  . TONSILLECTOMY      Current Medications: Current Outpatient Prescriptions  Medication Sig Dispense Refill  . amLODipine (NORVASC) 10 MG tablet Take 1 tablet (10 mg total) by mouth daily. 90 tablet 1  . colchicine 0.6 MG tablet Take 0.6 mg by mouth daily as needed (gout).      [name] doxazosin (CARDURA) 4 MG tablet Take 4 mg by mouth daily.     [name] esomeprazole (NEXIUM) 40 MG capsule Take 40 mg by mouth daily as needed (for acid reflux).     . fluticasone (FLONASE) 50 MCG/ACT nasal spray Place 2 sprays into both nostrils daily as needed for allergies or rhinitis.     [name] latanoprost (XALATAN) 0.005 % ophthalmic solution Place 1 drop into both eyes daily.    [name] loratadine (CLARITIN) 10 MG tablet Take 10 mg by mouth daily.    . potassium chloride (MICRO-K) 10 MEQ CR capsule Take 2 capsules (20 mEq total) by mouth 2 (two) times daily. 180 capsule 1  . trandolapril (MAVIK) 4 MG tablet Take 4 mg by mouth 2 (two) times daily.      No current facility-administered medications for this visit.      Allergies:   Aspirin; Codeine; and Shellfish allergy   Social History   Social History  . Marital status: Married    Spouse name: N/A  . Number of children: 4  . Years of education: N/A   Occupational History  . retired    Social History Main Topics  . Smoking status: Never Smoker  . Smokeless tobacco: Never Used  . Alcohol use Yes     Comment: none since 80's  . Drug use: No  . Sexual activity: Not Currently   Other Topics Concern  . None   Social History Narrative  . None     Family History:  The patient's family history includes Cancer in his brother; Diabetes in his sister; Heart disease in his mother; Leukemia in his maternal grandmother; Stroke in his father.   ROS:   Please see the history of present illness.  All other systems are reviewed and otherwise negative.    PHYSICAL EXAM:   VS:  BP 140/76   Pulse 76   Ht 5\' 8"  (1.727 m)   Wt 207 lb 12.8 oz (94.3 kg)   SpO2 98%   BMI 31.60 kg/m   BMI: Body mass index is 31.6 kg/m. GEN: Well nourished, well developed AAM in no acute distress  HEENT: normocephalic, atraumatic Neck: no JVD, carotid bruits, or masses Cardiac: RRR; no murmurs, rubs, or gallops, no edema  Respiratory:  clear to auscultation  bilaterally, normal work of breathing GI: soft, nontender, nondistended, + BS MS: no deformity or atrophy  Skin: warm and dry, no rash Neuro:  Alert and Oriented x 3, Strength and sensation are intact, follows commands Psych: euthymic mood, full affect  Wt Readings from Last 3 Encounters:  10/22/16 207 lb 12.8 oz (94.3 kg)  09/10/16 205 lb (93 kg)  09/06/16 201 lb (91.2 kg)      Studies/Labs Reviewed:   EKG:  EKG was not ordered today.  Recent Labs: 08/20/2016: ALT 11; Brain Natriuretic Peptide 48.7; Hemoglobin 15.4; Platelets 192 09/10/2016: TSH 1.91 09/22/2016: BUN  11; Creat 1.03; Potassium 3.5; Sodium 141   Lipid Panel   Additional studies/ records that were reviewed today include: Summarized above.  ASSESSMENT & PLAN:   1. Shortness of breath - resolved with improved blood pressure control and heart rate. May have been multifactorial. Continue current regimen. 2. Dizziness - resolved. Orthostatic precautions reviewed. 3. Sinus bradycardia - improved s/p DC of diltiazem. 4. Essential HTN - improved after swap from diltiazem to amlodipine. Continue present regimen. We reviewed importance of low sodium diet to help control BP spikes as well.   Disposition: F/u with Dr. Meda Coffee in 6 months.    Medication Adjustments/Labs and Tests Ordered: Current medicines are reviewed at length with the patient today.  Concerns regarding medicines are outlined above. Medication changes, Labs and Tests ordered today are summarized above and listed in the Patient Instructions accessible in Encounters.   Raechel Ache PA-C  10/22/2016 1:49 PM    [facility] [place], [place], [place]  [address] Phone: [phone]; Fax: [phone]

## 2016-10-22 ENCOUNTER — Encounter: Payer: Self-pay | Admitting: Physician Assistant

## 2016-10-22 ENCOUNTER — Ambulatory Visit (INDEPENDENT_AMBULATORY_CARE_PROVIDER_SITE_OTHER): Payer: Medicare Other | Admitting: Physician Assistant

## 2016-10-22 ENCOUNTER — Encounter (INDEPENDENT_AMBULATORY_CARE_PROVIDER_SITE_OTHER): Payer: Self-pay

## 2016-10-22 VITALS — BP 140/76 | HR 76 | Ht 68.0 in | Wt 207.8 lb

## 2016-10-22 DIAGNOSIS — R0602 Shortness of breath: Secondary | ICD-10-CM | POA: Diagnosis not present

## 2016-10-22 DIAGNOSIS — I10 Essential (primary) hypertension: Secondary | ICD-10-CM | POA: Diagnosis not present

## 2016-10-22 DIAGNOSIS — R001 Bradycardia, unspecified: Secondary | ICD-10-CM

## 2016-10-22 DIAGNOSIS — R42 Dizziness and giddiness: Secondary | ICD-10-CM | POA: Diagnosis not present

## 2016-10-22 NOTE — Patient Instructions (Signed)
Medication Instructions:  Your physician recommends that you continue on your current medications as directed. Please refer to the Current Medication list given to you today.   Labwork: None ordered  Testing/Procedures: None ordered  Follow-Up: Your physician wants you to follow-up in: 6 MONTHS WITH DR. NELSON.   You will receive a reminder letter in the mail two months in advance. If you don't receive a letter, please call our office to schedule the follow-up appointment.   Any Other Special Instructions Will Be Listed Below (If Applicable).     If you need a refill on your cardiac medications before your next appointment, please call your pharmacy.   

## 2017-02-28 ENCOUNTER — Other Ambulatory Visit: Payer: Self-pay | Admitting: Physician Assistant

## 2017-03-07 DIAGNOSIS — M109 Gout, unspecified: Secondary | ICD-10-CM | POA: Diagnosis not present

## 2017-03-07 DIAGNOSIS — I10 Essential (primary) hypertension: Secondary | ICD-10-CM | POA: Diagnosis not present

## 2017-03-07 DIAGNOSIS — M47812 Spondylosis without myelopathy or radiculopathy, cervical region: Secondary | ICD-10-CM | POA: Diagnosis not present

## 2017-03-07 DIAGNOSIS — E782 Mixed hyperlipidemia: Secondary | ICD-10-CM | POA: Diagnosis not present

## 2017-03-07 DIAGNOSIS — M1990 Unspecified osteoarthritis, unspecified site: Secondary | ICD-10-CM | POA: Diagnosis not present

## 2017-03-07 DIAGNOSIS — Z0000 Encounter for general adult medical examination without abnormal findings: Secondary | ICD-10-CM | POA: Diagnosis not present

## 2017-03-07 DIAGNOSIS — R7309 Other abnormal glucose: Secondary | ICD-10-CM | POA: Diagnosis not present

## 2017-03-07 DIAGNOSIS — N183 Chronic kidney disease, stage 3 (moderate): Secondary | ICD-10-CM | POA: Diagnosis not present

## 2017-03-07 DIAGNOSIS — Z1389 Encounter for screening for other disorder: Secondary | ICD-10-CM | POA: Diagnosis not present

## 2017-03-07 DIAGNOSIS — K219 Gastro-esophageal reflux disease without esophagitis: Secondary | ICD-10-CM | POA: Diagnosis not present

## 2017-03-07 DIAGNOSIS — N400 Enlarged prostate without lower urinary tract symptoms: Secondary | ICD-10-CM | POA: Diagnosis not present

## 2017-03-07 DIAGNOSIS — E876 Hypokalemia: Secondary | ICD-10-CM | POA: Diagnosis not present

## 2017-04-14 DIAGNOSIS — M545 Low back pain: Secondary | ICD-10-CM | POA: Diagnosis not present

## 2017-04-21 DIAGNOSIS — M1990 Unspecified osteoarthritis, unspecified site: Secondary | ICD-10-CM | POA: Diagnosis not present

## 2017-04-21 DIAGNOSIS — M549 Dorsalgia, unspecified: Secondary | ICD-10-CM | POA: Diagnosis not present

## 2017-04-21 DIAGNOSIS — M47812 Spondylosis without myelopathy or radiculopathy, cervical region: Secondary | ICD-10-CM | POA: Diagnosis not present

## 2017-04-25 DIAGNOSIS — H35413 Lattice degeneration of retina, bilateral: Secondary | ICD-10-CM | POA: Diagnosis not present

## 2017-04-25 DIAGNOSIS — H40053 Ocular hypertension, bilateral: Secondary | ICD-10-CM | POA: Diagnosis not present

## 2017-04-25 DIAGNOSIS — H52203 Unspecified astigmatism, bilateral: Secondary | ICD-10-CM | POA: Diagnosis not present

## 2017-04-25 DIAGNOSIS — H2512 Age-related nuclear cataract, left eye: Secondary | ICD-10-CM | POA: Diagnosis not present

## 2017-04-29 IMAGING — DX DG CHEST 2V
2 series · 2 of 2 positions shown · non-contrast
Comparison: PA and lateral chest x-ray June 07, 2014 and December 10, 2009

CLINICAL DATA: Two days of severe cough ; treated 1 week ago with
some clinical improvement but the cough is returned in the patient
is experiencing chest and back pain.

EXAM:
CHEST  2 VIEW

[chest pa]
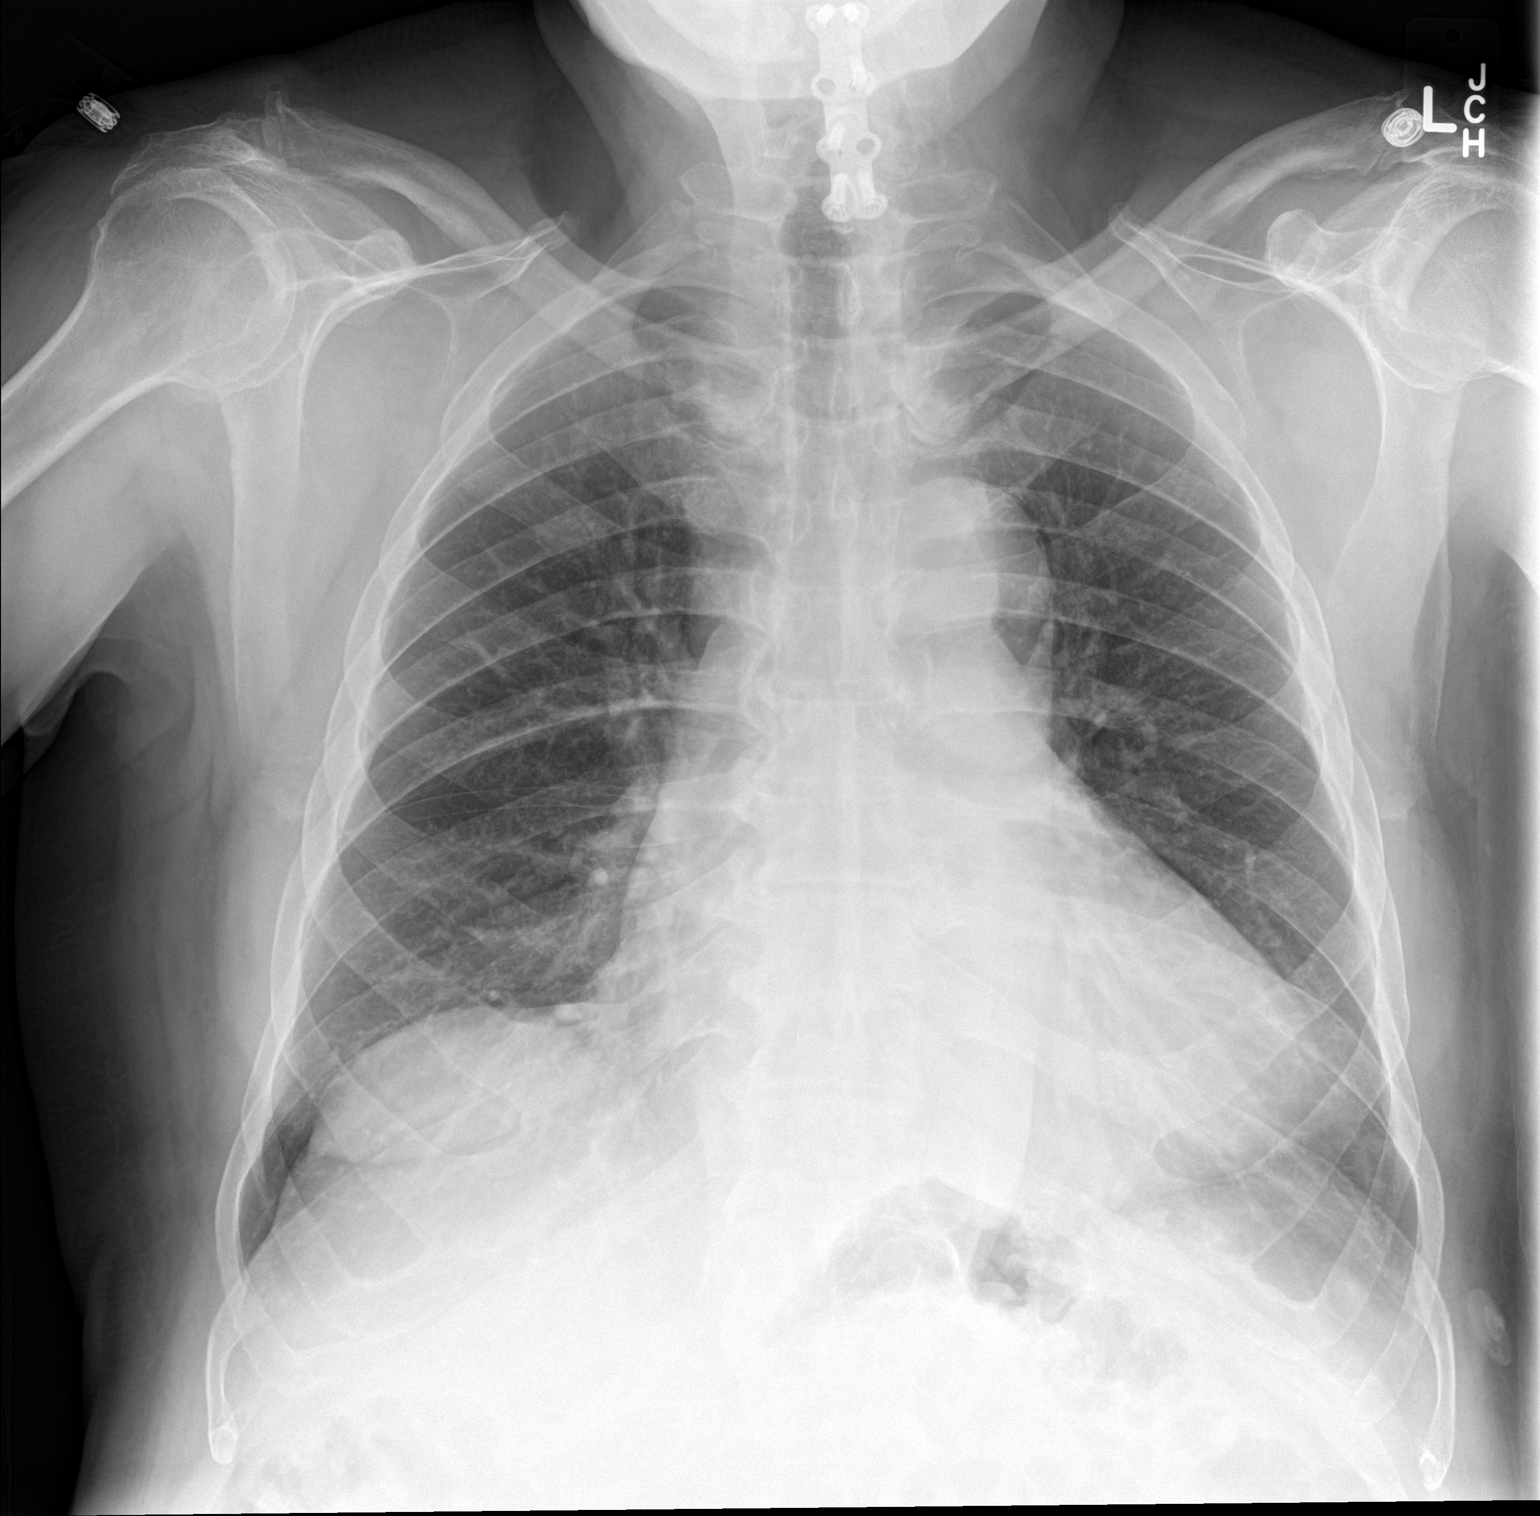

[chest lat]
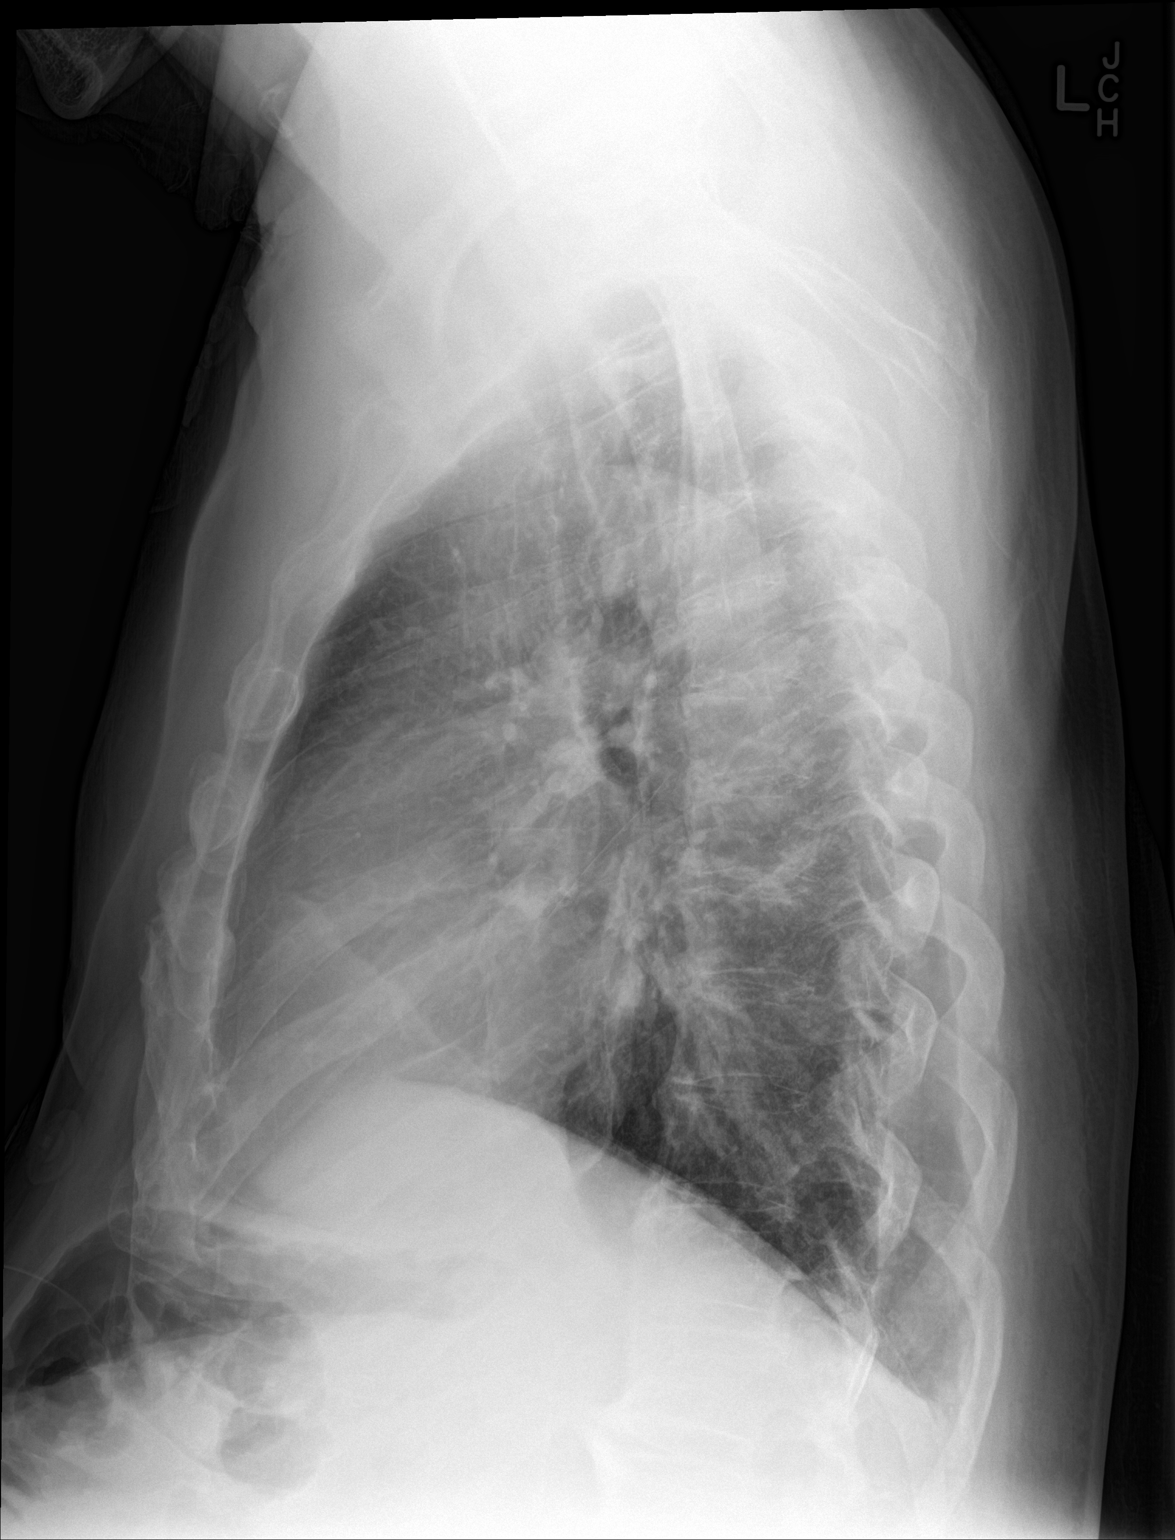

[2 of 2 positions shown; findings below may reference images not displayed]

FINDINGS: The lungs are adequately inflated. The interstitial markings are
mildly prominent though stable. There is no alveolar infiltrate.
There is no pleural effusion. The cardiac silhouette is mildly
enlarged but this is not new. The pulmonary vascularity is normal.
There is mild multilevel degenerative disc disease of the thoracic
spine.
IMPRESSION: Mild stable cardiomegaly without evidence of CHF. Mild pulmonary
interstitial prominence more conspicuous than in [DATE] reflect
acute on chronic bronchitic change.

## 2017-08-03 DIAGNOSIS — Z23 Encounter for immunization: Secondary | ICD-10-CM | POA: Diagnosis not present

## 2017-08-10 DIAGNOSIS — R0609 Other forms of dyspnea: Secondary | ICD-10-CM | POA: Diagnosis not present

## 2017-08-10 DIAGNOSIS — R42 Dizziness and giddiness: Secondary | ICD-10-CM | POA: Diagnosis not present

## 2017-08-10 DIAGNOSIS — R791 Abnormal coagulation profile: Secondary | ICD-10-CM | POA: Diagnosis not present

## 2017-08-11 ENCOUNTER — Ambulatory Visit
Admission: RE | Admit: 2017-08-11 | Discharge: 2017-08-11 | Disposition: A | Discharge status: Home / Self Care | Payer: Medicare Other | Source: Ambulatory Visit | Attending: Internal Medicine | Admitting: Internal Medicine

## 2017-08-11 ENCOUNTER — Other Ambulatory Visit: Payer: Self-pay | Admitting: Internal Medicine

## 2017-08-11 DIAGNOSIS — R7989 Other specified abnormal findings of blood chemistry: Secondary | ICD-10-CM

## 2017-08-11 MED ORDER — IOPAMIDOL (ISOVUE-370) INJECTION 76%
75.0000 mL | Freq: Once | INTRAVENOUS | Status: AC | PRN
  Administered 2017-08-11: 75 mL via INTRAVENOUS

## 2017-08-12 ENCOUNTER — Ambulatory Visit (INDEPENDENT_AMBULATORY_CARE_PROVIDER_SITE_OTHER): Payer: Medicare Other | Admitting: Internal Medicine

## 2017-08-12 ENCOUNTER — Encounter: Payer: Self-pay | Admitting: Internal Medicine

## 2017-08-12 VITALS — BP 122/74 | HR 65 | Ht 68.0 in | Wt 204.8 lb

## 2017-08-12 DIAGNOSIS — R0609 Other forms of dyspnea: Secondary | ICD-10-CM | POA: Diagnosis not present

## 2017-08-12 DIAGNOSIS — I10 Essential (primary) hypertension: Secondary | ICD-10-CM | POA: Diagnosis not present

## 2017-08-12 DIAGNOSIS — R0600 Dyspnea, unspecified: Secondary | ICD-10-CM

## 2017-08-12 MED ORDER — ESOMEPRAZOLE MAGNESIUM 40 MG PO CPDR: DELAYED_RELEASE_CAPSULE | ORAL | Status: DC

## 2017-08-12 MED ORDER — IRBESARTAN 150 MG PO TABS: 150.0000 mg | ORAL_TABLET | Freq: Every day | ORAL | 2 refills | Status: DC

## 2017-08-12 MED ORDER — FAMOTIDINE 20 MG PO TABS: ORAL_TABLET | ORAL | 2 refills | Status: DC

## 2017-08-12 NOTE — Assessment & Plan Note (Signed)
Echo 09/06/16  - Left ventricle: The cavity size was normal. There was mild   hypertrophy of the septum with moderate hypertrophy of the   posterior wall. Systolic function was normal. The estimated   ejection fraction was in the range of 55% to 60%. Wall motion was   normal; there were no regional wall motion abnormalities. Doppler   parameters are consistent with abnormal left ventricular   relaxation (grade 1 diastolic dysfunction). Doppler parameters   are consistent with indeterminate ventricular filling pressure. - Aortic valve: Transvalvular velocity was within the normal range.   There was no stenosis. There was mild regurgitation.   Regurgitation pressure half-time: 554 ms. - Mitral valve: Transvalvular velocity was within the normal range.   There was no evidence for stenosis. There was trivial   regurgitation. Valve area by pressure half-time: 1.85 cm^2. Valve   area by continuity equation (using LVOT flow): 2.06 cm^2. - Right ventricle: The cavity size was normal. Wall thickness was   normal. Systolic function was normal. - Right atrium: The atrium was mildly dilated. - Tricuspid valve: There was no regurgitation. 08/12/2017  Walked RA x 3 laps @ 185 ft each stopped due to  End of study, nl pace, no   Desat/ min sob/ pseudowheeze noted at end - Trial off acei 08/12/2017     Symptoms are markedly disproportionate to objective findings and not clear this is actually much of a  lung problem but pt does appear to have difficult to sort out respiratory symptoms of unknown origin for which  DDX  = almost all start with A and  include Adherence, Ace Inhibitors, Acid Reflux, Active Sinus Disease, Alpha 1 Antitripsin deficiency, Anxiety masquerading as Airways dz,  ABPA,  Allergy(esp in young), Aspiration (esp in elderly), Adverse effects of meds,  Active smokers, A bunch of PE's (a small clot burden can't cause this syndrome unless there is already severe underlying pulm or vascular dz with  poor reserve) plus two Bs  = Bronchiectasis and Beta blocker use..and one C= CHF     Adherence is always the initial "prime suspect" and is a multilayered concern that requires a "trust but verify" approach in every patient - starting with knowing how to use medications, especially inhalers, correctly, keeping up with refills and understanding the fundamental difference between maintenance and prns vs those medications only taken for a very short course and then stopped and not refilled.  - if not better, return with all meds in hand using a trust but verify approach to confirm accurate Medication  Reconciliation The principal here is that until we are certain that the  patients are doing what we've asked, it makes no sense to ask them to do more.    ACEi adverse effects at the  top of the usual list of suspects and the only way to rule it out is a trial off > see a/p    ? Acid (or non-acid) GERD > always difficult to exclude as up to 75% of pts in some series report no assoc GI/ Heartburn symptoms> rec max (24h)  acid suppression and diet restrictions/ reviewed and instructions given in writing.   ? Allergy/asthma > nothing to suggest and if anything this is pseudoasthma related to Upper airway cough syndrome (previously labeled PNDS),  is so named because it's frequently impossible to sort out how much is  CR/sinusitis with freq throat clearing (which can be related to primary GERD)   vs  causing  secondary ("  extra esophageal")  GERD from wide swings in gastric pressure that occur with throat clearing, often  promoting self use of mint and menthol lozenges that reduce the lower esophageal sphincter tone and exacerbate the problem further in a cyclical fashion.   These are the same pts (now being labeled as having "irritable larynx syndrome" by some cough centers) who not infrequently have a history of having failed to tolerate ace inhibitors,  dry powder inhalers or biphosphonates or report having  atypical/extraesophageal reflux symptoms that don't respond to standard doses of PPI (as appears to be the case here)  and are easily confused as having aecopd or asthma flares by even experienced allergists/ pulmonologists (myself included).   ? chf > see echo c/w diastolic dysfunction    Will see back in 6 weeks if not better on gerd rx/ off acei   Total time devoted to counseling  > 50 % of initial 60 min office visit:  review case with pt/ discussion of options/alternatives/ personally creating written customized instructions  in presence of pt  then going over those specific  Instructions directly with the pt including how to use all of the meds but in particular covering each new medication in detail and the difference between the maintenance= "automatic" meds and the prns using an action plan format for the latter (If this problem/symptom => do that organization reading Left to right).  Please see AVS from this visit for a full list of these instructions which I personally wrote for this pt and  are unique to this visit.

## 2017-08-12 NOTE — Progress Notes (Signed)
Subjective:     Patient ID: Bob Elliott, male   DOB: 01/25/1941,     MRN: [id number]  HPI  60 yobm never smoker with indolent onset doe 2016  x up the driveway to his house (probably [address]) x mild incline progressively worse > eval by cards first 2017 ? Better p changed cardizem to amlopidine by cards (? Chronotropic incompetence)  but gradually worse since last eval there 10/2016 so referred to pulmonary clinic 08/12/2017 by Dr   Lysle Rubens   08/12/2017 1st [facility] office visit/ Laelle Bridgett   Chief Complaint  Patient presents with  . Pulm Consult    Referred by Dr. Deforest Hoyles for DOE. States he becomes SOB while walking around, doing activities. Chest tightness.     In addition to indolent onset progressive doe also notes Noct smothering ? Almost strangling immediately if lie down on back/ better on either side  Also bad hb refractory to nexium take p supper plus multiple otcs  No obvious day to day or daytime variability or assoc excess/ purulent sputum or mucus plugs or hemoptysis or cp or  subjective wheeze or overt sinus or hb symptoms. No unusual exp hx or h/o childhood pna/ asthma or knowledge of premature birth.  Sleeping ok off back without nocturnal  or early am exacerbation  of respiratory  c/o's or need for noct saba. Also denies any obvious fluctuation of symptoms with weather or environmental changes or other aggravating or alleviating factors except as outlined above   Current Allergies, Complete Past Medical History, Past Surgical History, Family History, and Social History were reviewed in [employer] record.  ROS  The following are not active complaints unless bolded sore throat, dysphagia, dental problems, itching, sneezing,  nasal congestion or disharge of excess mucus or purulent secretions, ear ache,   fever, chills, sweats, unintended wt loss or wt gain, classically pleuritic or exertional cp,  orthopnea pnd or leg swelling, presyncope, palpitations,  abdominal pain, anorexia, nausea, vomiting, diarrhea  or change in bowel habits or bladder habits, change in stools or change in urine, dysuria, hematuria,  rash, arthralgias, visual complaints, headache, numbness, weakness or ataxia or problems with walking or coordination,  change in mood/affect or memory.        Current Meds  Medication Sig  . colchicine 0.6 MG tablet Take 0.6 mg by mouth daily as needed (gout).  [name] doxazosin (CARDURA) 4 MG tablet Take 4 mg by mouth daily.   [name] esomeprazole (NEXIUM) 40 MG capsule Take 30-60 min before first meal of the day  . fluticasone (FLONASE) 50 MCG/ACT nasal spray Place 2 sprays into both nostrils daily as needed for allergies or rhinitis.   [name] latanoprost (XALATAN) 0.005 % ophthalmic solution Place 1 drop into both eyes daily.  [name] loratadine (CLARITIN) 10 MG tablet Take 10 mg by mouth daily.  . potassium chloride (MICRO-K) 10 MEQ CR capsule Take 2 capsules (20 mEq total) by mouth 2 (two) times daily.  [name]   esomeprazole (NEXIUM) 40 MG capsule Take 40 mg by mouth daily as needed (for acid reflux).   . [  trandolapril (MAVIK) 4 MG tablet Take 4 mg by mouth 2 (two) times daily.              Review of Systems     Objective:   Physical Exam    amb bm mod hoarse, nad   Wt Readings from Last 3 Encounters:  08/12/17 204 lb 12.8 oz (92.9 kg)  10/22/16 207 lb 12.8 oz (94.3 kg)  09/10/16 205 lb (93 kg)    Vital signs reviewed - Note on arrival 02 sats  97% on RA       HEENT: nl  turbinates bilaterally, and oropharynx. Nl external ear canals without cough reflex - upper dentures, lower partial    NECK :  without JVD/Nodes/TM/ nl carotid upstrokes bilaterally   LUNGS: no acc muscle use,  Nl contour chest which is clear to A and P bilaterally without cough on insp or exp maneuvers   CV:  RRR  no s3 or murmur or increase in P2, and no edema   ABD:  soft and nontender with nl inspiratory excursion in the supine position. No bruits or  organomegaly appreciated, bowel sounds nl  MS:  Nl gait/ ext warm without deformities, calf tenderness, cyanosis or clubbing No obvious joint restrictions   SKIN: warm and dry without lesions    NEURO:  alert, approp, nl sensorium with  no motor or cerebellar deficits apparent.      I personally reviewed images and agree with radiology impression as follows:   Chest CT a 08/02/17 Mild cardiomegaly. 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 Recommend annual imaging followup by CTA or MRA. This recommendation follows 2010 ACCF/AHA/AATS/ACR/ASA/SCA/SCAI/SIR/STS/SVM Guidelines for the Diagnosis and Management of Patients with Thoracic Aortic Disease. Circulation. 2010; 121: I347-Q259  No evidence of pulmonary embolus.  Probable small epicardial or pericardial cyst in the right cardiophrenic region. Recommend attention on follow-up imaging.  Labs  reviewed:      Chemistry      Component Value Date/Time   NA 141 09/22/2016 0933   K 3.5 09/22/2016 0933   CL 104 09/22/2016 0933   CO2 27 09/22/2016 0933   BUN 11 09/22/2016 0933   CREATININE 1.03 09/22/2016 0933      Component Value Date/Time   CALCIUM 8.8 09/22/2016 0933   ALKPHOS 71 08/20/2016 1635   AST 14 08/20/2016 1635   ALT 11 08/20/2016 1635   BILITOT 0.5 08/20/2016 1635        Lab Results  Component Value Date   WBC 6.1 08/20/2016   HGB 15.4 08/20/2016   HCT 45.6 08/20/2016   MCV 89.8 08/20/2016   PLT 192 08/20/2016         Lab Results  Component Value Date   TSH 1.91 09/10/2016        Assessment:

## 2017-08-12 NOTE — Patient Instructions (Addendum)
Change nexium to Take 30-60 min before first meal of the day and Pepcid 20 mg at bedtime and your heartburn symptoms should gradually fade away, especially once you are off mavik   Stop mavik and start avapro(ibesartan)  150 mg daily  - just take one half daily if too strong   GERD (REFLUX)  is an extremely common cause of respiratory symptoms just like yours , many times with no obvious heartburn at all.    It can be treated with medication, but also with lifestyle changes including elevation of the head of your bed (ideally with 6 inch  bed blocks),  Smoking cessation, avoidance of late meals, excessive alcohol, and avoid fatty foods, chocolate, peppermint, colas, red wine, and acidic juices such as orange juice.  NO MINT OR MENTHOL PRODUCTS SO NO COUGH DROPS   USE SUGARLESS CANDY INSTEAD (Jolley ranchers or Stover's or Life Savers) or even ice chips will also do - the key is to swallow to prevent all throat clearing. NO OIL BASED VITAMINS - use powdered substitutes.    In 6 weeks, if  you are satisfied with your treatment plan,  let your doctor know and he/she can either refill your medications or you can return here when your prescription runs out.     If in any way you are not 100% satisfied in 6 weeks,  Schedule follow up with me with all your medications in hand

## 2017-08-12 NOTE — Assessment & Plan Note (Signed)
In the best review of chronic cough to date ( NEJM 2016 375 [phone]) ,  ACEi are now felt to cause cough in up to  20% of pts which is a 4 fold increase from previous reports and does not include the variety of non-specific complaints we see in pulmonary clinic in pts on ACEi but previously attributed to another dx like  Copd/asthma and  include PNDS, throat and chest congestion, "bronchitis", unexplained dyspnea and noct "strangling" sensations, and hoarseness, but also  atypical /refractory GERD symptoms like dysphagia and "bad heartburn"   The only way I know  to prove this is not an "ACEi Case" is a trial off ACEi x a minimum of 6 weeks then regroup.    If not better next w/u with pfts/ cpst.    .

## 2017-08-16 DIAGNOSIS — H43813 Vitreous degeneration, bilateral: Secondary | ICD-10-CM | POA: Diagnosis not present

## 2017-08-17 ENCOUNTER — Telehealth: Payer: Self-pay | Admitting: Internal Medicine

## 2017-08-17 NOTE — Telephone Encounter (Signed)
Spoke with pt, states that since MW switch his bp meds from [place] to Avapro 150mg , pt's BP has risen.  States that on previous med it was running in the 120's-70's and this morning it was 145/92.  Pt denies any s/s aside from occasional dizziness when waking up.    Pt is requesting further recs.  MW please advise.  Thanks.   9/28 AVS- Change nexium to Take 30-60 min before first meal of the day and Pepcid 20 mg at bedtime and your heartburn symptoms should gradually fade away, especially once you are off mavik    Stop mavik and start avapro(ibesartan)  150 mg daily  - just take one half daily if too strong    GERD (REFLUX)  is an extremely common cause of respiratory symptoms just like yours , many times with no obvious heartburn at all.     It can be treated with medication, but also with lifestyle changes including elevation of the head of your bed (ideally with 6 inch  bed blocks),  Smoking cessation, avoidance of late meals, excessive alcohol, and avoid fatty foods, chocolate, peppermint, colas, red wine, and acidic juices such as orange juice.  NO MINT OR MENTHOL PRODUCTS SO NO COUGH DROPS   USE SUGARLESS CANDY INSTEAD (Jolley ranchers or Stover's or Life Savers) or even ice chips will also do - the key is to swallow to prevent all throat clearing. NO OIL BASED VITAMINS - use powdered substitutes.     In 6 weeks, if  you are satisfied with your treatment plan,  let your doctor know and he/she can either refill your medications or you can return here when your prescription runs out.      If in any way you are not 100% satisfied in 6 weeks,  Schedule follow up with me with all your medications in hand

## 2017-08-17 NOTE — Telephone Encounter (Signed)
Not ideal control but certainly not dangerous. Ok to just take 2 avapro daily either as 2 each day or one bid, whichever he feels is more convenient, then when it runs out we can give him the 300 mg dose

## 2017-08-17 NOTE — Telephone Encounter (Signed)
Spoke with pt,aware of recs.  Nothing further needed.  

## 2017-08-23 ENCOUNTER — Telehealth: Payer: Self-pay | Admitting: Internal Medicine

## 2017-08-23 MED ORDER — IRBESARTAN 150 MG PO TABS: 150.0000 mg | ORAL_TABLET | Freq: Every day | ORAL | 0 refills | Status: DC

## 2017-08-23 MED ORDER — FAMOTIDINE 20 MG PO TABS: ORAL_TABLET | ORAL | 0 refills | Status: DC

## 2017-08-23 NOTE — Telephone Encounter (Signed)
Spoke with pt, who states he has been 1.5 tabs of Avapro daily. Pt states BP is maintaining around 128/78. Pt request Rx for Avapro and Pepcid to be sent to [facility] for a 90 day supply. Rx for Avapro was sent on 08/12/17 as a 30 day supply. Pt instructed to continue Pepcid at 08/12/17 OV.  Both Rx have been sent in for 90 day supply.  Nothing further needed.

## 2017-08-23 NOTE — Telephone Encounter (Signed)
Also, wants to know if he is to stay on famotidine and if so please send in a 90 day supply for this also.  He states he is breathing better.  He mowed his yard earlier and felt great.

## 2017-08-24 ENCOUNTER — Other Ambulatory Visit: Payer: Self-pay | Admitting: Physician Assistant

## 2017-08-29 ENCOUNTER — Telehealth: Payer: Self-pay | Admitting: Internal Medicine

## 2017-08-29 NOTE — Telephone Encounter (Signed)
Left message for patient to call back  

## 2017-08-29 NOTE — Telephone Encounter (Signed)
If his bp is ok on the 1.5 then just change the rx   pepcid is over the counter   Goal is just to get him through until we regroup here on 09/23/17

## 2017-08-29 NOTE — Telephone Encounter (Signed)
Spoke with patient. He stated that his insurance will not cover the famotidine or irbesartan 150mg . Patient then explained that per MW, he is supposed to be taking 1.5 tablets.   Spoke with Ailene Ravel who stated that the patient picked up both the Pepcid and Avapro on 9/28. The only reason the insurance would not pay for the medication because it too soon for a refill.   I looked at [facility], I did not see where he was advised to take 1.5 tablets of irbesartan.   MW, please advise. Thanks!

## 2017-08-30 NOTE — Telephone Encounter (Signed)
Attempted to call the pt but it stated that the wireless customer to you are calling is unavailable.

## 2017-08-31 MED ORDER — IRBESARTAN 150 MG PO TABS: ORAL_TABLET | ORAL | 0 refills | Status: DC

## 2017-08-31 NOTE — Telephone Encounter (Signed)
Pt called stating the prescription for: irbesartan (AVAPRO) 150 MG tablet was to be for 1 1/2 tablets by mouth because he was having problems with his blood pressure, and it was to be for a 90-day supply; pt contact # [phone]

## 2017-08-31 NOTE — Telephone Encounter (Signed)
ATC, unable to reach him. Will attempt again later.

## 2017-08-31 NOTE — Telephone Encounter (Signed)
Spoke with patient. Advised him that the correct RX will be sent in for him. He verbalized understanding. Nothing else needed at time of call.

## 2017-09-07 DIAGNOSIS — N183 Chronic kidney disease, stage 3 (moderate): Secondary | ICD-10-CM | POA: Diagnosis not present

## 2017-09-07 DIAGNOSIS — M47812 Spondylosis without myelopathy or radiculopathy, cervical region: Secondary | ICD-10-CM | POA: Diagnosis not present

## 2017-09-07 DIAGNOSIS — J309 Allergic rhinitis, unspecified: Secondary | ICD-10-CM | POA: Diagnosis not present

## 2017-09-07 DIAGNOSIS — E782 Mixed hyperlipidemia: Secondary | ICD-10-CM | POA: Diagnosis not present

## 2017-09-07 DIAGNOSIS — K219 Gastro-esophageal reflux disease without esophagitis: Secondary | ICD-10-CM | POA: Diagnosis not present

## 2017-09-07 DIAGNOSIS — I10 Essential (primary) hypertension: Secondary | ICD-10-CM | POA: Diagnosis not present

## 2017-09-23 ENCOUNTER — Ambulatory Visit: Payer: Medicare Other | Admitting: Internal Medicine

## 2017-11-10 DIAGNOSIS — H40053 Ocular hypertension, bilateral: Secondary | ICD-10-CM | POA: Diagnosis not present

## 2017-11-10 DIAGNOSIS — H40013 Open angle with borderline findings, low risk, bilateral: Secondary | ICD-10-CM | POA: Diagnosis not present

## 2017-11-14 ENCOUNTER — Other Ambulatory Visit: Payer: Self-pay | Admitting: Physician Assistant

## 2017-11-21 ENCOUNTER — Telehealth: Payer: Self-pay | Admitting: Internal Medicine

## 2017-11-21 NOTE — Telephone Encounter (Signed)
Called and spoke with pt.  Pt states he received a 90 tablets of Avapro 150mg . Pt stated he needs 126 tablets for a 90 day supply. Per our records Avapro 150mg  was prescribed on 08/31/17 for #135.  Sp[facility] with [facility], who states pt has two Rx on file for Avapro and the 90 tablet Rx was refilled.  Larene Beach states pt can bring back his current Rx and get a 90 day supply. Pt request that our office call back after 12:30 with a response.  Will leave message in triage to call back after 12:30.

## 2017-11-22 ENCOUNTER — Other Ambulatory Visit: Payer: Self-pay | Admitting: Internal Medicine

## 2017-11-22 NOTE — Telephone Encounter (Signed)
Spoke with pt,aware of recs.  Nothing further needed.  

## 2017-11-30 ENCOUNTER — Other Ambulatory Visit: Payer: Self-pay | Admitting: Cardiology

## 2017-11-30 MED ORDER — AMLODIPINE BESYLATE 10 MG PO TABS: 10.0000 mg | ORAL_TABLET | Freq: Every day | ORAL | 0 refills | Status: DC

## 2017-12-04 NOTE — Progress Notes (Signed)
Cardiology Office Note:    Date:  12/05/2017   ID:  Bob Elliott, DOB 1941/09/07, MRN [id number]  PCP:  Bob Low, MD  Cardiologist:  Dr. Ena Elliott  Referring MD: Bob Low, MD   Chief Complaint  Patient presents with  . Follow-up    HTN    History of Present Illness:    Bob Elliott is a 77 y.o. male with a past medical history significant for HTN, hyperlipidemia, GERD, esophageal dysmotility, GI bleed, Schatzki's ring, BPH, mild sleep apnea 2005(not on CPAP). 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   He had atypical chest pain and ruled out for MI in 2009. He had exertional dyspnea and some dizziness in 08/2016 and was felt to be volume overloaded with edema on exam. There was concern for some right heart failure and lasix recommended but not carried out. Cardizem was decreased due to dizziness and bradycardia with 1st degree AVB. BNP was 48. Echo on 09/06/2016 showed mild LVH of septum, mod hypertrophy of posterior wall, EF 55-60%, grade 1 DD, mild AI, trivial MR, mild RAE. A nuclear stress test on 09/06/2016 was Elliott risk. At follow up the heart rate was still Elliott, in the 50's and BP was higher. His diltiazem was changed to amlodipine. He was tried on HCTZ at one time but this was stopped due to rise in SCr and hypokalemia.   At last follow up in 10/2016 he was doing well without symptoms of heart failure and BP was well controlled. He is here today for follow up alone. He admits to some mild shortness of breath occasionally with more strenuous exertion. He says this has been present for several years and not worse lately. He exercises at a gym 1-2 times per week with walking on treadmill for 30 minutes and some lifting some weights. He denies any chest discomfort with exercise or other times except had mild chest tightness one time last week when he was walking for 1/2 mile from his friend's  house. It was beginning to rain and he was almost running. This did not really bother him and it has not occured at other times. He has some mild shortness of breath when he wakes in the night to rush to the bathroom, but denies shortness of breath with lying flat. He also denies edema. He has gained about 6 pounds since September, but admits to eating ice cream and cookies every night. He at first said that he limits his sodium but then I found out that he eats ham and balogna and other processed foods. He does try to eat lots of fruits and vegetables.   He monitors his BP at home and it usually runs around 140/70. He is taking irbesartan 150 mg - 1.5 tablets. He was taking 1 tablet in the morning and 1/2 tablet at night. When asked about any fluid retention in the past he says that he was once on a diuretic, but he did not like it and is adamant that he would not take a diuretic again.    Past Medical History:  Diagnosis Date  . Arthritis   . BPH (benign prostatic hyperplasia)   . Esophageal dysmotility   . GERD (gastroesophageal reflux disease)   . GI bleed   . Glaucoma    BOTH EYES  . Hyperlipidemia   . Hypertension   . Schatzki's ring   .  Sinus bradycardia    a. prompting dc of Diltiazem (?chronotropic incompetence) in 2017.  [name] Sleep apnea    "mild" no cpap    Past Surgical History:  Procedure Laterality Date  . COLONOSCOPY WITH PROPOFOL N/A 05/11/2016   Procedure: COLONOSCOPY WITH PROPOFOL;  Surgeon: Garlan Fair, MD;  Location: [facility];  Service: Endoscopy;  Laterality: N/A;  . EYE SURGERY     CATARACT R  EYE  . HERNIA REPAIR     UMBILICAL  . INGUINAL HERNIA REPAIR Right 06/18/2014   Procedure: RIGHT INGUINAL HERNIA REPAIR;  Surgeon: Odis Hollingshead, MD;  Location: [place];  Service: General;  Laterality: Right;  . INSERTION OF MESH Right 06/18/2014   Procedure: INSERTION OF MESH;  Surgeon: Odis Hollingshead, MD;  Location: [place];  Service: General;  Laterality: Right;    . NECK SURGERY  2000   cervical disc surgery(limited ROM)- retained hardware  . TONSILLECTOMY      Current Medications: Current Meds  Medication Sig  . amLODipine (NORVASC) 10 MG tablet Take 1 tablet (10 mg total) by mouth daily. Please keep upcoming appt for future refills. Thank you  . doxazosin (CARDURA) 4 MG tablet Take 4 mg by mouth daily.   [name] esomeprazole (NEXIUM) 40 MG capsule Take 30-60 min before first meal of the day  . fluticasone (FLONASE) 50 MCG/ACT nasal spray Place 2 sprays into both nostrils daily as needed for allergies or rhinitis.   [name] irbesartan (AVAPRO) 150 MG tablet TAKE 1 & 1/2 TABLET EVERY DAY  . latanoprost (XALATAN) 0.005 % ophthalmic solution Place 1 drop into both eyes daily.  [name] loratadine (CLARITIN) 10 MG tablet Take 10 mg by mouth daily.  . potassium chloride (MICRO-K) 10 MEQ CR capsule Take 2 capsules (20 mEq total) by mouth 2 (two) times daily.  . timolol (TIMOPTIC) 0.5 % ophthalmic solution INSTILL ONE DROP INTO EACH EYE IN THE MORNING     Allergies:   Aspirin; Codeine; and Shellfish allergy   Social History   Socioeconomic History  . Marital status: Married    Spouse name: None  . Number of children: 4  . Years of education: None  . Highest education level: None  Social Needs  . Financial resource strain: None  . Food insecurity - worry: None  . Food insecurity - inability: None  . Transportation needs - medical: None  . Transportation needs - non-medical: None  Occupational History  . Occupation: retired  Tobacco Use  . Smoking status: Never Smoker  . Smokeless tobacco: Never Used  Substance and Sexual Activity  . Alcohol use: Yes    Comment: none since 80's  . Drug use: No  . Sexual activity: Not Currently  Other Topics Concern  . None  Social History Narrative  . None     Family History: The patient's family history includes Cancer in his brother; Diabetes in his sister; Heart disease in his mother; Leukemia in his maternal  grandmother; Stroke in his father. ROS:   Please see the history of present illness.     All other systems reviewed and are negative.  EKGs/Labs/Other Studies Reviewed:    The following studies were reviewed today:  Echocardiogram 09/06/2016 Study Conclusions  - Left ventricle: The cavity size was normal. There was mild   hypertrophy of the septum with moderate hypertrophy of the   posterior wall. Systolic function was normal. The estimated   ejection fraction was in the range of 55% to 60%. Wall  motion was   normal; there were no regional wall motion abnormalities. Doppler   parameters are consistent with abnormal left ventricular   relaxation (grade 1 diastolic dysfunction). Doppler parameters   are consistent with indeterminate ventricular filling pressure. - Aortic valve: Transvalvular velocity was within the normal range.   There was no stenosis. There was mild regurgitation.   Regurgitation pressure half-time: 554 ms. - Mitral valve: Transvalvular velocity was within the normal range.   There was no evidence for stenosis. There was trivial   regurgitation. Valve area by pressure half-time: 1.85 cm^2. Valve   area by continuity equation (using LVOT flow): 2.06 cm^2. - Right ventricle: The cavity size was normal. Wall thickness was   normal. Systolic function was normal. - Right atrium: The atrium was mildly dilated. - Tricuspid valve: There was no regurgitation. - Global longitudinal strain -17.2%  Nuclear stress test 09/06/2016 Study Highlights   Nuclear stress EF: 53%.  Blood pressure demonstrated a normal response to exercise.  There was no ST segment deviation noted during stress.  This is a Elliott risk study.  The left ventricular ejection fraction is mildly decreased (45-54%).  Inferior and apical thinning consistent with soft tissue attenuaion (diaphragm), cannot exclude small subendocardial scar No ichemia      EKG:  EKG is ordered today.  The ekg ordered  today demonstrates sinus rhythm with 1st degree AVB 60 bpm. No changes.   Recent Labs: No results found for requested labs within last 8760 hours.   Recent Lipid Panel    Component Value Date/Time   CHOL  02/17/2008 1145    179        ATP III CLASSIFICATION:  <200     mg/dL   Desirable  200-239  mg/dL   Borderline High  >=240    mg/dL   High   TRIG 136 02/17/2008 1145   HDL 37 (L) 02/17/2008 1145   CHOLHDL 4.8 02/17/2008 1145   VLDL 27 02/17/2008 1145   LDLCALC (H) 02/17/2008 1145    115        Total Cholesterol/HDL:CHD Risk Coronary Heart Disease Risk Table                     Men   Women  1/2 Average Risk   3.4   3.3    Physical Exam:    VS:  BP (!) 144/78   Pulse 67   Resp 16   Ht 5\' 8"  (1.727 m)   Wt 210 lb 1.9 oz (95.3 kg)   SpO2 96%   BMI 31.95 kg/m     Wt Readings from Last 3 Encounters:  12/05/17 210 lb 1.9 oz (95.3 kg)  08/12/17 204 lb 12.8 oz (92.9 kg)  10/22/16 207 lb 12.8 oz (94.3 kg)     Physical Exam  Constitutional: He is oriented to person, place, and time. He appears well-developed and well-nourished. No distress.  HENT:  Head: Normocephalic and atraumatic.  Neck: Normal range of motion. Neck supple. No JVD present.  Cardiovascular: Normal rate, regular rhythm and normal heart sounds. Exam reveals no gallop and no friction rub.  No murmur heard. Pulmonary/Chest: Effort normal and breath sounds normal. No respiratory distress. He has no wheezes. He has no rales.  Abdominal: Soft. Bowel sounds are normal. He exhibits no distension. There is no tenderness.  Musculoskeletal: Normal range of motion. He exhibits no edema or deformity.  Neurological: He is alert and oriented to person, place, and time.  Skin: Skin is warm and dry.  Psychiatric: He has a normal mood and affect. His behavior is normal. Thought content normal.     ASSESSMENT:    1. Essential hypertension   2. Sinus bradycardia    PLAN:    In order of problems listed  above:  HYPERTENSION: Currently on amlodipine and irbesartan. BP not optimally controlled but pt does not want to increase any of his meds or add any. He is taking irbesartan 150 mg 1.5 pills, but has been taking 1 in the am and 1/2 in the pm. We will try to have him take all of the dose at one time in the morning. He will monitor at home. Also discussed reducing his sodium intake with avoiding processed meats and foods. Continue to exercise and work on reducing sugar intake, portion control and trying to lose wt. Continue regular exercise.   Dizziness in the past: no longer having dizziness.   Bradyacardia in he past: HR is 60 on EKG. Improved with discontinuation of diltiazem.   Discussed monitoring for and reporting wt gain of 3-5 pounds in a week, increased dyspnea on exertion, swelling, or chest pain/pressure.   Disposition: follow up with Dr. Meda Coffee in 6 months  Medication Adjustments/Labs and Tests Ordered: Current medicines are reviewed at length with the patient today.  Concerns regarding medicines are outlined above. Labs and tests ordered and medication changes are outlined in the patient instructions below:  Patient Instructions  Medication Instructions:  Your physician recommends that you continue on your current medications as directed. Please refer to the Current Medication list given to you today.   Labwork: None ordered  Testing/Procedures: None ordered  Follow-Up: Your physician wants you to follow-up in: [place] DR. Johann Capers will receive a reminder letter in the mail two months in advance. If you don't receive a letter, please call our office to schedule the follow-up appointment.   Any Other Special Instructions Will Be Listed Below (If Applicable).     If you need a refill on your cardiac medications before your next appointment, please call your pharmacy.      Signed, Daune Perch, NP  12/05/2017 12:53 PM    [facility]

## 2017-12-05 ENCOUNTER — Encounter: Payer: Self-pay | Admitting: Cardiology

## 2017-12-05 ENCOUNTER — Encounter (INDEPENDENT_AMBULATORY_CARE_PROVIDER_SITE_OTHER): Payer: Self-pay

## 2017-12-05 ENCOUNTER — Ambulatory Visit (INDEPENDENT_AMBULATORY_CARE_PROVIDER_SITE_OTHER): Payer: Medicare Other | Admitting: Cardiology

## 2017-12-05 VITALS — BP 144/78 | HR 67 | Resp 16 | Ht 68.0 in | Wt 210.1 lb

## 2017-12-05 DIAGNOSIS — I10 Essential (primary) hypertension: Secondary | ICD-10-CM | POA: Diagnosis not present

## 2017-12-05 DIAGNOSIS — R001 Bradycardia, unspecified: Secondary | ICD-10-CM

## 2017-12-05 NOTE — Patient Instructions (Signed)
Medication Instructions:  Your physician recommends that you continue on your current medications as directed. Please refer to the Current Medication list given to you today.   Labwork: None ordered  Testing/Procedures: None ordered  Follow-Up: Your physician wants you to follow-up in: 6 MONTHS WITH DR. NELSON.   You will receive a reminder letter in the mail two months in advance. If you don't receive a letter, please call our office to schedule the follow-up appointment.   Any Other Special Instructions Will Be Listed Below (If Applicable).     If you need a refill on your cardiac medications before your next appointment, please call your pharmacy.   

## 2018-01-24 DIAGNOSIS — R21 Rash and other nonspecific skin eruption: Secondary | ICD-10-CM | POA: Diagnosis not present

## 2018-02-13 ENCOUNTER — Other Ambulatory Visit: Payer: Self-pay | Admitting: Internal Medicine

## 2018-02-14 ENCOUNTER — Encounter: Payer: Self-pay | Admitting: Podiatry

## 2018-02-14 ENCOUNTER — Ambulatory Visit (INDEPENDENT_AMBULATORY_CARE_PROVIDER_SITE_OTHER): Payer: Medicare Other | Admitting: Podiatry

## 2018-02-14 VITALS — BP 173/79 | HR 77 | Resp 18

## 2018-02-14 DIAGNOSIS — L600 Ingrowing nail: Secondary | ICD-10-CM | POA: Diagnosis not present

## 2018-02-14 DIAGNOSIS — L539 Erythematous condition, unspecified: Secondary | ICD-10-CM

## 2018-02-14 MED ORDER — CEPHALEXIN 500 MG PO CAPS: 500.0000 mg | ORAL_CAPSULE | Freq: Three times a day (TID) | ORAL | 0 refills | Status: DC

## 2018-02-14 NOTE — Progress Notes (Signed)
Subjective:    Patient ID: Bob Elliott, male    DOB: Sep 15, 1941, 77 y.o.   MRN: [id number]  HPI 77 year old male presents the office with concerns of pain to the lateral aspect of the left big toe which is been ongoing intermittently the last 6 months been getting worse.  He has been trying to put cotton underneath the area to help lifted up.  The area is gotten somewhat red and swollen on the corner that he has noticed.  He denies any drainage or pus coming from the area.  He said no other treatment.  He has no other concerns today.   Review of Systems  All other systems reviewed and are negative.  Past Medical History:  Diagnosis Date  . Arthritis   . BPH (benign prostatic hyperplasia)   . Esophageal dysmotility   . GERD (gastroesophageal reflux disease)   . GI bleed   . Glaucoma    BOTH EYES  . Hyperlipidemia   . Hypertension   . Schatzki's ring   . Sinus bradycardia    a. prompting dc of Diltiazem (?chronotropic incompetence) in 2017.  [name] Sleep apnea    "mild" no cpap    Past Surgical History:  Procedure Laterality Date  . COLONOSCOPY WITH PROPOFOL N/A 05/11/2016   Procedure: COLONOSCOPY WITH PROPOFOL;  Surgeon: Garlan Fair, MD;  Location: [facility];  Service: Endoscopy;  Laterality: N/A;  . EYE SURGERY     CATARACT R  EYE  . HERNIA REPAIR     UMBILICAL  . INGUINAL HERNIA REPAIR Right 06/18/2014   Procedure: RIGHT INGUINAL HERNIA REPAIR;  Surgeon: Odis Hollingshead, MD;  Location: [place];  Service: General;  Laterality: Right;  . INSERTION OF MESH Right 06/18/2014   Procedure: INSERTION OF MESH;  Surgeon: Odis Hollingshead, MD;  Location: [place];  Service: General;  Laterality: Right;  . NECK SURGERY  2000   cervical disc surgery(limited ROM)- retained hardware  . TONSILLECTOMY       Current Outpatient Medications:  .  amLODipine (NORVASC) 10 MG tablet, Take 1 tablet (10 mg total) by mouth daily. Please keep upcoming appt for future refills. Thank you, Disp: 90  tablet, Rfl: 0 .  cephALEXin (KEFLEX) 500 MG capsule, Take 1 capsule (500 mg total) by mouth 3 (three) times daily., Disp: 30 capsule, Rfl: 0 .  doxazosin (CARDURA) 4 MG tablet, Take 4 mg by mouth daily. , Disp: , Rfl:  .  esomeprazole (NEXIUM) 40 MG capsule, Take 30-60 min before first meal of the day, Disp: , Rfl:  .  fluticasone (FLONASE) 50 MCG/ACT nasal spray, Place 2 sprays into both nostrils daily as needed for allergies or rhinitis. , Disp: , Rfl:  .  irbesartan (AVAPRO) 150 MG tablet, TAKE 1 & 1/2 TABLET EVERY DAY, Disp: 135 tablet, Rfl: 0 .  latanoprost (XALATAN) 0.005 % ophthalmic solution, Place 1 drop into both eyes daily., Disp: , Rfl:  .  loratadine (CLARITIN) 10 MG tablet, Take 10 mg by mouth daily., Disp: , Rfl:  .  potassium chloride (MICRO-K) 10 MEQ CR capsule, Take 2 capsules (20 mEq total) by mouth 2 (two) times daily., Disp: 180 capsule, Rfl: 1 .  timolol (TIMOPTIC) 0.5 % ophthalmic solution, INSTILL ONE DROP INTO EACH EYE IN THE MORNING, Disp: , Rfl: 4  Allergies  Allergen Reactions  . Aspirin Hives, Itching and Nausea Only  . Codeine Hives, Itching and Nausea Only  . Shellfish Allergy Hives  Social History   Socioeconomic History  . Marital status: Married    Spouse name: Not on file  . Number of children: 4  . Years of education: Not on file  . Highest education level: Not on file  Occupational History  . Occupation: retired  Scientific laboratory technician  . Financial resource strain: Not on file  . Food insecurity:    Worry: Not on file    Inability: Not on file  . Transportation needs:    Medical: Not on file    Non-medical: Not on file  Tobacco Use  . Smoking status: Never Smoker  . Smokeless tobacco: Never Used  Substance and Sexual Activity  . Alcohol use: Yes    Comment: none since 80's  . Drug use: No  . Sexual activity: Not Currently  Lifestyle  . Physical activity:    Days per week: Not on file    Minutes per session: Not on file  . Stress: Not on  file  Relationships  . Social connections:    Talks on phone: Not on file    Gets together: Not on file    Attends religious service: Not on file    Active member of club or organization: Not on file    Attends meetings of clubs or organizations: Not on file    Relationship status: Not on file  . Intimate partner violence:    Fear of current or ex partner: Not on file    Emotionally abused: Not on file    Physically abused: Not on file    Forced sexual activity: Not on file  Other Topics Concern  . Not on file  Social History Narrative  . Not on file        Objective:   Physical Exam General: AAO x3, NAD  Dermatological: Incurvation present along the lateral aspect the left hallux toenail with tenderness palpation there is localized edema and erythema along the area.  Tenderness palpation of the nail corner.  No ascending cellulitis.  No fluctuation or crepitation.  No pain of the nail corners.  No other open lesions or pre-ulcerative lesions.  Vascular: Dorsalis Pedis artery and Posterior Tibial artery pedal pulses are 2/4 bilateral with immedate capillary fill time. Pedal hair growth present. No varicosities and no lower extremity edema present bilateral. There is no pain with calf compression, swelling, warmth, erythema.   Neruologic: Grossly intact via light touch bilateral. Vibratory intact via tuning fork bilateral. Protective threshold with Semmes Wienstein monofilament intact to all pedal sites bilateral.  Musculoskeletal: Tenderness to the left hallux ingrown toenail but no other area tenderness no gross boney pedal deformities bilateral. No pain, crepitus, or limitation noted with foot and ankle range of motion bilateral. Muscular strength 5/5 in all groups tested bilateral.  Gait: Unassisted, Nonantalgic.      Assessment & Plan:  77 year old male symptomatically more toenail left lateral hallux toenail localized erythema -Treatment options discussed including all  alternatives, risks, and complications -Etiology of symptoms were discussed -I discussed with him partial nail avulsion with chemical matricectomy.  Discussed the procedure as well as the postoperative course and he wishes to proceed.  Surgical consent form was signed.  The toe was prepped with alcohol and a mixture of 2% lidocaine plain 0.5% Marcaine plain was infiltrated in a digital block fashion.  The toe was prepped and a tourniquet was applied.  Next the lateral portion of the left hallux toenail was sharply excised and is to remove the entire offending nail  corner.  There was a small amount of purulence identified.  Also I was able to remove the cotton ball that remained that was deep within the nail corner.  Because of the infection I did hold off on chemical matricectomy.  I discussed that there is likely recurrence but given the infection will be safer for him not to have the phenol.  I was able to irrigate the area and clean it and there is no further foreign body identified there is no further purulence.  Hemostasis was achieved.  Silvadene was applied followed by a bandage.  Tourniquet was released and there is found to be an immediate capillary refill time to the digit.  Post procedure instructions were again discussed with him and he had no further questions or concerns.  Follow-up in 1 week for nail check or sooner if needed.  Trula Slade DPM

## 2018-02-14 NOTE — Patient Instructions (Signed)

## 2018-02-23 ENCOUNTER — Encounter: Payer: Self-pay | Admitting: Podiatry

## 2018-02-23 ENCOUNTER — Ambulatory Visit (INDEPENDENT_AMBULATORY_CARE_PROVIDER_SITE_OTHER): Payer: Medicare Other | Admitting: Podiatry

## 2018-02-23 DIAGNOSIS — Z9889 Other specified postprocedural states: Secondary | ICD-10-CM

## 2018-02-23 DIAGNOSIS — L600 Ingrowing nail: Secondary | ICD-10-CM

## 2018-02-23 NOTE — Progress Notes (Signed)
Subjective: Bob Elliott is a 77 y.o.  male returns to office today for follow up evaluation after having left Hallux latera partial nail avulsion performed. Patient has been soaking using epsom salts and applying topical antibiotic covered with bandaid daily up until last night.  He denies any pain that he has had in part of the procedure has resolved.  Denies any drainage or pus or any redness.. Patient denies fevers, chills, nausea, vomiting. Denies any calf pain, chest pain, SOB.   Objective:  Vitals: Reviewed  General: Well developed, nourished, in no acute distress, alert and oriented x3   Dermatology: Skin is warm, dry and supple bilateral. Left hallux nail border appears to be clean, dry, with mild granular tissue and surrounding scab. There is no surrounding erythema, edema, drainage/purulence. The remaining nails appear unremarkable at this time. There are no other lesions or other signs of infection present.  Neurovascular status: Intact. No lower extremity swelling; No pain with calf compression bilateral.  Musculoskeletal: No tenderness to palpation of the lateral hallux nail fold. Muscular strength within normal limits bilateral.   Assesement and Plan: S/p partial nail avulsion, doing well.   -Continue soaking in epsom salts twice a day followed by antibiotic ointment and a band-aid. Can leave uncovered at night. Continue this until completely healed.  -If the area has not healed in 2 weeks, call the office for follow-up appointment, or sooner if any problems arise.  -Monitor for any signs/symptoms of infection. Call the office immediately if any occur or go directly to the emergency room. Call with any questions/concerns.  Celesta Gentile, DPM

## 2018-02-23 NOTE — Patient Instructions (Signed)

## 2018-02-27 ENCOUNTER — Other Ambulatory Visit: Payer: Self-pay | Admitting: Cardiology

## 2018-02-27 DIAGNOSIS — M545 Low back pain: Secondary | ICD-10-CM | POA: Diagnosis not present

## 2018-03-13 DIAGNOSIS — Z1389 Encounter for screening for other disorder: Secondary | ICD-10-CM | POA: Diagnosis not present

## 2018-03-13 DIAGNOSIS — J309 Allergic rhinitis, unspecified: Secondary | ICD-10-CM | POA: Diagnosis not present

## 2018-03-13 DIAGNOSIS — K219 Gastro-esophageal reflux disease without esophagitis: Secondary | ICD-10-CM | POA: Diagnosis not present

## 2018-03-13 DIAGNOSIS — K224 Dyskinesia of esophagus: Secondary | ICD-10-CM | POA: Diagnosis not present

## 2018-03-13 DIAGNOSIS — Z0000 Encounter for general adult medical examination without abnormal findings: Secondary | ICD-10-CM | POA: Diagnosis not present

## 2018-03-13 DIAGNOSIS — N400 Enlarged prostate without lower urinary tract symptoms: Secondary | ICD-10-CM | POA: Diagnosis not present

## 2018-03-13 DIAGNOSIS — M47812 Spondylosis without myelopathy or radiculopathy, cervical region: Secondary | ICD-10-CM | POA: Diagnosis not present

## 2018-03-13 DIAGNOSIS — E782 Mixed hyperlipidemia: Secondary | ICD-10-CM | POA: Diagnosis not present

## 2018-03-13 DIAGNOSIS — M1990 Unspecified osteoarthritis, unspecified site: Secondary | ICD-10-CM | POA: Diagnosis not present

## 2018-03-13 DIAGNOSIS — R7309 Other abnormal glucose: Secondary | ICD-10-CM | POA: Diagnosis not present

## 2018-03-13 DIAGNOSIS — I10 Essential (primary) hypertension: Secondary | ICD-10-CM | POA: Diagnosis not present

## 2018-03-13 DIAGNOSIS — N183 Chronic kidney disease, stage 3 (moderate): Secondary | ICD-10-CM | POA: Diagnosis not present

## 2018-03-13 DIAGNOSIS — M109 Gout, unspecified: Secondary | ICD-10-CM | POA: Diagnosis not present

## 2018-03-28 DIAGNOSIS — M545 Low back pain: Secondary | ICD-10-CM | POA: Diagnosis not present

## 2018-03-28 DIAGNOSIS — M5412 Radiculopathy, cervical region: Secondary | ICD-10-CM | POA: Diagnosis not present

## 2018-04-12 ENCOUNTER — Other Ambulatory Visit: Payer: Self-pay | Admitting: Internal Medicine

## 2018-05-16 DIAGNOSIS — H524 Presbyopia: Secondary | ICD-10-CM | POA: Diagnosis not present

## 2018-05-16 DIAGNOSIS — H35413 Lattice degeneration of retina, bilateral: Secondary | ICD-10-CM | POA: Diagnosis not present

## 2018-05-16 DIAGNOSIS — H2512 Age-related nuclear cataract, left eye: Secondary | ICD-10-CM | POA: Diagnosis not present

## 2018-05-16 DIAGNOSIS — H40053 Ocular hypertension, bilateral: Secondary | ICD-10-CM | POA: Diagnosis not present

## 2018-06-05 DIAGNOSIS — G43909 Migraine, unspecified, not intractable, without status migrainosus: Secondary | ICD-10-CM | POA: Diagnosis not present

## 2018-06-27 DIAGNOSIS — R0981 Nasal congestion: Secondary | ICD-10-CM | POA: Diagnosis not present

## 2018-06-27 DIAGNOSIS — R51 Headache: Secondary | ICD-10-CM | POA: Diagnosis not present

## 2018-06-27 DIAGNOSIS — M5090 Cervical disc disorder, unspecified, unspecified cervical region: Secondary | ICD-10-CM | POA: Diagnosis not present

## 2018-07-05 ENCOUNTER — Other Ambulatory Visit: Payer: Self-pay

## 2018-07-05 ENCOUNTER — Encounter (HOSPITAL_COMMUNITY): Payer: Self-pay | Admitting: Emergency Medicine

## 2018-07-05 ENCOUNTER — Emergency Department (HOSPITAL_COMMUNITY): Payer: Medicare Other

## 2018-07-05 ENCOUNTER — Emergency Department (HOSPITAL_COMMUNITY)
Admission: EM | Admit: 2018-07-05 | Discharge: 2018-07-05 | Disposition: A | Discharge status: Home / Self Care | Payer: Medicare Other | Attending: Emergency Medicine | Admitting: Emergency Medicine

## 2018-07-05 DIAGNOSIS — R079 Chest pain, unspecified: Secondary | ICD-10-CM | POA: Diagnosis not present

## 2018-07-05 DIAGNOSIS — J40 Bronchitis, not specified as acute or chronic: Secondary | ICD-10-CM | POA: Diagnosis not present

## 2018-07-05 DIAGNOSIS — R0602 Shortness of breath: Secondary | ICD-10-CM | POA: Diagnosis not present

## 2018-07-05 DIAGNOSIS — J209 Acute bronchitis, unspecified: Secondary | ICD-10-CM | POA: Insufficient documentation

## 2018-07-05 DIAGNOSIS — I10 Essential (primary) hypertension: Secondary | ICD-10-CM | POA: Diagnosis not present

## 2018-07-05 DIAGNOSIS — Z79899 Other long term (current) drug therapy: Secondary | ICD-10-CM | POA: Insufficient documentation

## 2018-07-05 DIAGNOSIS — R05 Cough: Secondary | ICD-10-CM | POA: Diagnosis not present

## 2018-07-05 LAB — CBC
HCT: 48.6 % (ref 39.0–52.0)
Hemoglobin: 16.2 g/dL (ref 13.0–17.0)
MCH: 30.7 pg (ref 26.0–34.0)
MCHC: 33.3 g/dL (ref 30.0–36.0)
MCV: 92 fL (ref 78.0–100.0)
Platelets: 183 10*3/uL (ref 150–400)
RBC: 5.28 MIL/uL (ref 4.22–5.81)
RDW: 13.6 % (ref 11.5–15.5)
WBC: 8.8 10*3/uL (ref 4.0–10.5)

## 2018-07-05 LAB — I-STAT TROPONIN, ED
Troponin i, poc: 0.03 ng/mL (ref 0.00–0.08)
Troponin i, poc: 0.01 ng/mL (ref 0.00–0.08)

## 2018-07-05 LAB — BASIC METABOLIC PANEL
Anion gap: 9 (ref 5–15)
BUN: 15 mg/dL (ref 8–23)
CO2: 29 mmol/L (ref 22–32)
Calcium: 8.7 mg/dL — ABNORMAL LOW (ref 8.9–10.3)
Chloride: 103 mmol/L (ref 98–111)
Creatinine, Ser: 1.35 mg/dL — ABNORMAL HIGH (ref 0.61–1.24)
GFR calc Af Amer: 56 mL/min — ABNORMAL LOW (ref >60)
GFR calc non Af Amer: 49 mL/min — ABNORMAL LOW (ref >60)
Glucose, Bld: 136 mg/dL — ABNORMAL HIGH (ref 70–99)
Potassium: 3 mmol/L — ABNORMAL LOW (ref 3.5–5.1)
Sodium: 141 mmol/L (ref 135–145)

## 2018-07-05 LAB — BRAIN NATRIURETIC PEPTIDE: B Natriuretic Peptide: 43.8 pg/mL (ref 0.0–100.0)

## 2018-07-05 MED ORDER — POTASSIUM CHLORIDE CRYS ER 20 MEQ PO TBCR
40.0000 meq | EXTENDED_RELEASE_TABLET | Freq: Once | ORAL | Status: AC
  Administered 2018-07-05: 40 meq via ORAL
  Filled 2018-07-05: qty 2

## 2018-07-05 MED ORDER — DOXYCYCLINE HYCLATE 100 MG PO CAPS: 100.0000 mg | ORAL_CAPSULE | Freq: Two times a day (BID) | ORAL | 0 refills | Status: DC

## 2018-07-05 NOTE — ED Notes (Signed)
To X-Ray at 16:35.

## 2018-07-05 NOTE — ED Provider Notes (Signed)
[facility] EMERGENCY DEPARTMENT Provider Note   CSN: [id number] Arrival date & time: 07/05/18  1530     History   Chief Complaint Chief Complaint  Patient presents with  . Shortness of Breath    HPI Bob Elliott is a 77 y.o. male.  Patient complains of persistent cough.  He saw his doctor and was treated with prednisone and antibiotic which he finished a few days ago and has not improved.  No fevers chills  The history is provided by the patient. No language interpreter was used.  Cough  This is a recurrent problem. The current episode started more than 1 week ago. The problem occurs constantly. The problem has not changed since onset.The cough is non-productive. There has been no fever. Pertinent negatives include no chest pain and no headaches. He has tried nothing for the symptoms.    Past Medical History:  Diagnosis Date  . Arthritis   . BPH (benign prostatic hyperplasia)   . Dizziness 10/21/2016  . DOE (dyspnea on exertion) 10/21/2016   Echo 09/06/16  - Left ventricle: The cavity size was normal. There was mild   hypertrophy of the septum with moderate hypertrophy of the   posterior wall. Systolic function was normal. The estimated   ejection fraction was in the range of 55% to 60%. Wall motion was   normal; there were no regional wall motion abnormalities. Doppler   parameters are consistent with abnormal left ventricular   rela  . Esophageal dysmotility   . Essential hypertension    Trial off acei 08/12/2017 for unexplained sob/ noct smothering   . GERD (gastroesophageal reflux disease)   . GI bleed   . Glaucoma    BOTH EYES  . Hyperlipidemia   . Hypertension   . Inguinal hernia without mention of obstruction or gangrene, unilateral or unspecified, (not specified as recurrent)-right s/p repair with mesh 05/21/2014  . Schatzki's ring   . Sinus bradycardia    a. prompting dc of Diltiazem (?chronotropic incompetence) in 2017.  [name] Sleep apnea    "mild"  no cpap    Patient Active Problem List   Diagnosis Date Noted  . DOE (dyspnea on exertion) 10/21/2016  . Dizziness 10/21/2016  . Sinus bradycardia 10/21/2016  . Sleep apnea   . Schatzki's ring   . Essential hypertension   . Hyperlipidemia   . Esophageal dysmotility   . GERD (gastroesophageal reflux disease)   . Inguinal hernia without mention of obstruction or gangrene, unilateral or unspecified, (not specified as recurrent)-right s/p repair with mesh 05/21/2014    Past Surgical History:  Procedure Laterality Date  . COLONOSCOPY WITH PROPOFOL N/A 05/11/2016   Procedure: COLONOSCOPY WITH PROPOFOL;  Surgeon: Garlan Fair, MD;  Location: [facility];  Service: Endoscopy;  Laterality: N/A;  . EYE SURGERY     CATARACT R  EYE  . HERNIA REPAIR     UMBILICAL  . INGUINAL HERNIA REPAIR Right 06/18/2014   Procedure: RIGHT INGUINAL HERNIA REPAIR;  Surgeon: Odis Hollingshead, MD;  Location: [facility];  Service: General;  Laterality: Right;  . INSERTION OF MESH Right 06/18/2014   Procedure: INSERTION OF MESH;  Surgeon: Odis Hollingshead, MD;  Location: [facility];  Service: General;  Laterality: Right;  . NECK SURGERY  2000   cervical disc surgery(limited ROM)- retained hardware  . TONSILLECTOMY          Home Medications    Prior to Admission medications   Medication Sig Start Date  End Date Taking? Authorizing Provider  amLODipine (NORVASC) 10 MG tablet TAKE 1 TABLET (10 MG TOTAL) BY MOUTH DAILY. PLEASE KEEP UPCOMING APPT FOR FUTURE REFILLS. [place] YOU Patient taking differently: Take 10 mg by mouth daily.  02/27/18  Yes Dorothy Spark, MD  doxazosin (CARDURA) 4 MG tablet Take 4 mg by mouth at bedtime.  02/12/14  Yes [provider]  esomeprazole (NEXIUM) 40 MG capsule Take 30-60 min before first meal of the day Patient taking differently: Take 40 mg by mouth daily.  08/12/17  Yes Tanda Rockers, MD  fluticasone (FLONASE) 50 MCG/ACT nasal spray Place 2 sprays into both nostrils daily  as needed for allergies or rhinitis.  03/28/14  Yes [provider]  irbesartan (AVAPRO) 150 MG tablet TAKE 1 & 1/2 TABLET EVERY DAY Patient taking differently: Take 225 mg by mouth daily.  04/12/18  Yes Tanda Rockers, MD  loratadine (CLARITIN) 10 MG tablet Take 10 mg by mouth daily.   Yes [provider]  potassium chloride (MICRO-K) 10 MEQ CR capsule Take 2 capsules (20 mEq total) by mouth 2 (two) times daily. 09/17/16  Yes Almyra Deforest, PA  timolol (TIMOPTIC) 0.5 % ophthalmic solution Place 1 drop into both eyes daily.  11/25/17  Yes [provider]  cephALEXin (KEFLEX) 500 MG capsule Take 1 capsule (500 mg total) by mouth 3 (three) times daily. Patient not taking: Reported on 07/05/2018 02/14/18   Trula Slade, DPM  doxycycline (VIBRAMYCIN) 100 MG capsule Take 1 capsule (100 mg total) by mouth 2 (two) times daily. One po bid x 7 days 07/05/18   Milton Ferguson, MD    Family History Family History  Problem Relation Age of Onset  . Heart disease Mother   . Stroke Father   . Cancer Brother        colon  . Diabetes Sister   . Leukemia Maternal Grandmother     Social History Social History   Tobacco Use  . Smoking status: Never Smoker  . Smokeless tobacco: Never Used  Substance Use Topics  . Alcohol use: Yes    Comment: none since 80's  . Drug use: No     Allergies   Aspirin; Codeine; and Shellfish allergy   Review of Systems Review of Systems  Constitutional: Negative for appetite change and fatigue.  HENT: Negative for congestion, ear discharge and sinus pressure.   Eyes: Negative for discharge.  Respiratory: Positive for cough.   Cardiovascular: Negative for chest pain.  Gastrointestinal: Negative for abdominal pain and diarrhea.  Genitourinary: Negative for frequency and hematuria.  Musculoskeletal: Negative for back pain.  Skin: Negative for rash.  Neurological: Negative for seizures and headaches.  Psychiatric/Behavioral: Negative for  hallucinations.     Physical Exam Updated Vital Signs BP (!) 144/74   Pulse 63   Temp 98 F (36.7 C) (Oral)   Resp (!) 21   SpO2 96%   Physical Exam  Constitutional: He is oriented to person, place, and time. He appears well-developed.  HENT:  Head: Normocephalic.  Eyes: Conjunctivae and EOM are normal. No scleral icterus.  Neck: Neck supple. No thyromegaly present.  Cardiovascular: Normal rate and regular rhythm. Exam reveals no gallop and no friction rub.  No murmur heard. Pulmonary/Chest: No stridor. He has no wheezes. He has no rales. He exhibits no tenderness.  Abdominal: He exhibits no distension. There is no tenderness. There is no rebound.  Musculoskeletal: Normal range of motion. He exhibits no edema.  Lymphadenopathy:  He has no cervical adenopathy.  Neurological: He is oriented to person, place, and time. He exhibits normal muscle tone. Coordination normal.  Skin: No rash noted. No erythema.  Psychiatric: He has a normal mood and affect. His behavior is normal.     ED Treatments / Results  Labs (all labs ordered are listed, but only abnormal results are displayed) Labs Reviewed  BASIC METABOLIC PANEL - Abnormal; Notable for the following components:      Result Value   Potassium 3.0 (*)    Glucose, Bld 136 (*)    Creatinine, Ser 1.35 (*)    Calcium 8.7 (*)    GFR calc non Af Amer 49 (*)    GFR calc Af Amer 56 (*)    All other components within normal limits  CBC  BRAIN NATRIURETIC PEPTIDE  I-STAT TROPONIN, ED  I-STAT TROPONIN, ED    EKG EKG Interpretation  Date/Time:  Wednesday July 05 2018 15:40:25 EDT Ventricular Rate:  98 PR Interval:  190 QRS Duration: 92 QT Interval:  366 QTC Calculation: 467 R Axis:   71 Text Interpretation:  Sinus rhythm with Premature atrial complexes with Abberant conduction Otherwise normal ECG Confirmed by Milton Ferguson [phone]) on 07/05/2018 10:01:09 PM   Radiology Dg Chest 2 View  Result Date:  07/05/2018 CLINICAL DATA:  Short of breath and central chest pain for 3 days. EXAM: CHEST - 2 VIEW COMPARISON:  Chest CT, 08/11/2017.  Chest radiographs, 02/27/2016. FINDINGS: Cardiac silhouette is mildly enlarged. No mediastinal or hilar masses. No evidence of adenopathy. Clear lungs.  No pleural effusion or pneumothorax. Skeletal structures are intact. IMPRESSION: No active cardiopulmonary disease. Electronically Signed   By: Lajean Manes M.D.   On: 07/05/2018 16:51    Procedures Procedures (including critical care time)  Medications Ordered in ED Medications  potassium chloride SA (K-DUR,KLOR-CON) CR tablet 40 mEq (40 mEq Oral Given 07/05/18 2128)     Initial Impression / Assessment and Plan / ED Course  I have reviewed the triage vital signs and the nursing notes.  Pertinent labs & imaging results that were available during my care of the patient were reviewed by me and considered in my medical decision making (see chart for details).    Labs EKG chest x-ray unremarkable.  Patient will be placed on doxycycline for possible atypical infection will follow-up with his PCP  Final Clinical Impressions(s) / ED Diagnoses   Final diagnoses:  Bronchitis    ED Discharge Orders         Ordered    doxycycline (VIBRAMYCIN) 100 MG capsule  2 times daily     07/05/18 2320           Milton Ferguson, MD 07/05/18 2323

## 2018-07-05 NOTE — ED Triage Notes (Addendum)
Patient to ED c/o shortness of breath x 2-3 days with non-productive cough. He endorses chest tightness, worse with exertion and taking deep breaths. Reports he took prednisone and antibiotic last week - given by PCP for sinus infection. Resp e/u, skin warm/dry.

## 2018-07-05 NOTE — Discharge Instructions (Addendum)
Follow-up with your family doctor next week for recheck. 

## 2018-07-07 ENCOUNTER — Ambulatory Visit: Payer: Medicare Other | Admitting: Cardiology

## 2018-07-20 ENCOUNTER — Ambulatory Visit: Payer: Medicare Other | Admitting: Cardiology

## 2018-07-21 ENCOUNTER — Telehealth: Payer: Self-pay

## 2018-07-21 ENCOUNTER — Encounter: Payer: Self-pay | Admitting: Cardiology

## 2018-07-21 ENCOUNTER — Ambulatory Visit (INDEPENDENT_AMBULATORY_CARE_PROVIDER_SITE_OTHER): Payer: Medicare Other | Admitting: Cardiology

## 2018-07-21 VITALS — BP 110/56 | HR 72 | Ht 68.0 in | Wt 209.0 lb

## 2018-07-21 DIAGNOSIS — Z79899 Other long term (current) drug therapy: Secondary | ICD-10-CM | POA: Diagnosis not present

## 2018-07-21 DIAGNOSIS — I712 Thoracic aortic aneurysm, without rupture, unspecified: Secondary | ICD-10-CM | POA: Insufficient documentation

## 2018-07-21 DIAGNOSIS — I10 Essential (primary) hypertension: Secondary | ICD-10-CM | POA: Diagnosis not present

## 2018-07-21 DIAGNOSIS — E876 Hypokalemia: Secondary | ICD-10-CM

## 2018-07-21 LAB — BASIC METABOLIC PANEL
BUN/Creatinine Ratio: 9 — ABNORMAL LOW (ref 10–24)
BUN: 11 mg/dL (ref 8–27)
CO2: 27 mmol/L (ref 20–29)
Calcium: 8.8 mg/dL (ref 8.6–10.2)
Chloride: 102 mmol/L (ref 96–106)
Creatinine, Ser: 1.17 mg/dL (ref 0.76–1.27)
GFR calc Af Amer: 69 mL/min/{1.73_m2} (ref >59)
GFR calc non Af Amer: 59 mL/min/{1.73_m2} — ABNORMAL LOW (ref >59)
Glucose: 91 mg/dL (ref 65–99)
Potassium: 3.3 mmol/L — ABNORMAL LOW (ref 3.5–5.2)
Sodium: 142 mmol/L (ref 134–144)

## 2018-07-21 NOTE — Telephone Encounter (Signed)
Per protocol, patient's low potassium of 3.3, advised patient to take an extra dose of Potassium for 2 days and then scheduling a BMET in 1 week, on 9/13. The patient expressed understanding and agreed with the plan.

## 2018-07-21 NOTE — Patient Instructions (Addendum)
Medication Instructions:  Your physician recommends that you continue on your current medications as directed. Please refer to the Current Medication list given to you today.  Labwork: Your physician recommends that you have lab work today- BMET  Testing/Procedures: Cardiac CT scanning, (CAT scanning), is a noninvasive, special x-ray that produces cross-sectional images of the body using x-rays and a computer. CT scans help physicians diagnose and treat medical conditions. For some CT exams, a contrast material is used to enhance visibility in the area of the body being studied. CT scans provide greater clarity and reveal more details than regular x-ray exams.  Follow-Up: Your physician wants you to follow-up in: 6 months with Dr. Meda Coffee. You will receive a reminder letter in the mail two months in advance. If you don't receive a letter, please call our office to schedule the follow-up appointment.   If you need a refill on your cardiac medications before your next appointment, please call your pharmacy.

## 2018-07-21 NOTE — Progress Notes (Signed)
07/21/2018 [name]   [birth year]  [id number]  Primary Physician [name], MD Primary Cardiologist: Dr. Meda Coffee  Reason for Visit/CC: F/u for HTN  HPI:  JABEN BENEGAS is a 77 y.o. male, followed by Dr. Meda Coffee, with a h/o HTN,Aortic Aneurysm, HLD, GERD, esophageal dysmotility, BPH and mild CPAP on study 08/2004 (not on CPAP). He had a negative stress test in 2007 for pre-op clearance of neck surgery. His last echo was 08/2016 and showed normal LVEF at 55-60%, G1DD and mild AI.   Most recently, he was seen in the ED 07/05/18 for dyspnea. BNP was normal at 43.8. Troponin negative x 2. CXR unremarkable. EDP placed pt on doxycycline for possible atypical infection and was instructed to f/u with PCP.   He is here today for f/u primarily for HTN. Last seen the end of January of this year and instructed to f/u in 6 months. BP is well controlled today at 110/56. HR 72 bpm. He denies CP. He notes exertional dyspnea walking up steep inclines and stairs but he notes this is his baseline for years. Not any worse. He denies exertional dyspnea walking on flat surfaces and no dyspnea with ADLs. \ The dyspnea and cough he had when seen in the ED resolved with antibiotics.   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 Radiologist recommended annual imaging f/u by CTA or MRA.     Current Meds  Medication Sig  . amLODipine (NORVASC) 10 MG tablet TAKE 1 TABLET (10 MG TOTAL) BY MOUTH DAILY. PLEASE KEEP UPCOMING APPT FOR FUTURE REFILLS. THANK YOU  . doxazosin (CARDURA) 4 MG tablet Take 4 mg by mouth at bedtime.   [name] esomeprazole (NEXIUM) 40 MG capsule Take 30-60 min before first meal of the day  . fluticasone (FLONASE) 50 MCG/ACT nasal spray Place 2 sprays into both nostrils daily as needed for allergies or rhinitis.   [name] irbesartan (AVAPRO) 150 MG tablet TAKE 1 & 1/2 TABLET EVERY DAY  . loratadine (CLARITIN) 10 MG  tablet Take 10 mg by mouth daily.  . potassium chloride (MICRO-K) 10 MEQ CR capsule Take 2 capsules (20 mEq total) by mouth 2 (two) times daily.  . timolol (TIMOPTIC) 0.5 % ophthalmic solution Place 1 drop into both eyes daily.    Allergies  Allergen Reactions  . Aspirin Hives, Itching and Nausea Only  . Codeine Hives, Itching and Nausea Only  . Shellfish Allergy Hives   Past Medical History:  Diagnosis Date  . Arthritis   . BPH (benign prostatic hyperplasia)   . Dizziness 10/21/2016  . DOE (dyspnea on exertion) 10/21/2016   Echo 09/06/16  - Left ventricle: The cavity size was normal. There was mild   hypertrophy of the septum with moderate hypertrophy of the   posterior wall. Systolic function was normal. The estimated   ejection fraction was in the range of 55% to 60%. Wall motion was   normal; there were no regional wall motion abnormalities. Doppler   parameters are consistent with abnormal left ventricular   rela  . Esophageal dysmotility   . Essential hypertension    Trial off acei 08/12/2017 for unexplained sob/ noct smothering   . GERD (gastroesophageal reflux disease)   . GI bleed   . Glaucoma    BOTH EYES  . Hyperlipidemia   . Hypertension   . Inguinal hernia without mention of obstruction or gangrene, unilateral  or unspecified, (not specified as recurrent)-right s/p repair with mesh 05/21/2014  . Schatzki's ring   . Sinus bradycardia    a. prompting dc of Diltiazem (?chronotropic incompetence) in 2017.  [name] Sleep apnea    "mild" no cpap   Family History  Problem Relation Age of Onset  . Heart disease Mother   . Stroke Father   . Cancer Brother        colon  . Diabetes Sister   . Leukemia Maternal Grandmother    Past Surgical History:  Procedure Laterality Date  . COLONOSCOPY WITH PROPOFOL N/A 05/11/2016   Procedure: COLONOSCOPY WITH PROPOFOL;  Surgeon: Garlan Fair, MD;  Location: [facility];  Service: Endoscopy;  Laterality: N/A;  . EYE SURGERY     CATARACT R   EYE  . HERNIA REPAIR     UMBILICAL  . INGUINAL HERNIA REPAIR Right 06/18/2014   Procedure: RIGHT INGUINAL HERNIA REPAIR;  Surgeon: Odis Hollingshead, MD;  Location: [facility];  Service: General;  Laterality: Right;  . INSERTION OF MESH Right 06/18/2014   Procedure: INSERTION OF MESH;  Surgeon: Odis Hollingshead, MD;  Location: [facility];  Service: General;  Laterality: Right;  . NECK SURGERY  2000   cervical disc surgery(limited ROM)- retained hardware  . TONSILLECTOMY     Social History   Socioeconomic History  . Marital status: Married    Spouse name: Not on file  . Number of children: 4  . Years of education: Not on file  . Highest education level: Not on file  Occupational History  . Occupation: retired  Scientific laboratory technician  . Financial resource strain: Not on file  . Food insecurity:    Worry: Not on file    Inability: Not on file  . Transportation needs:    Medical: Not on file    Non-medical: Not on file  Tobacco Use  . Smoking status: Never Smoker  . Smokeless tobacco: Never Used  Substance and Sexual Activity  . Alcohol use: Yes    Comment: none since 80's  . Drug use: No  . Sexual activity: Not Currently  Lifestyle  . Physical activity:    Days per week: Not on file    Minutes per session: Not on file  . Stress: Not on file  Relationships  . Social connections:    Talks on phone: Not on file    Gets together: Not on file    Attends religious service: Not on file    Active member of club or organization: Not on file    Attends meetings of clubs or organizations: Not on file    Relationship status: Not on file  . Intimate partner violence:    Fear of current or ex partner: Not on file    Emotionally abused: Not on file    Physically abused: Not on file    Forced sexual activity: Not on file  Other Topics Concern  . Not on file  Social History Narrative  . Not on file     Review of Systems: General: negative for chills, fever, night sweats or weight changes.    Cardiovascular: negative for chest pain, dyspnea on exertion, edema, orthopnea, palpitations, paroxysmal nocturnal dyspnea or shortness of breath Dermatological: negative for rash Respiratory: negative for cough or wheezing Urologic: negative for hematuria Abdominal: negative for nausea, vomiting, diarrhea, bright red blood per rectum, melena, or hematemesis Neurologic: negative for visual changes, syncope, or dizziness All other systems reviewed and are otherwise negative  except as noted above.   Physical Exam:  Blood pressure (!) 110/56, pulse 72, height 5\' 8"  (1.727 m), weight 209 lb (94.8 kg).  General appearance: alert, cooperative, no distress and moderately obese Neck: no carotid bruit and no JVD Lungs: clear to auscultation bilaterally Heart: regular rate and rhythm, S1, S2 normal, no murmur, click, rub or gallop Extremities: extremities normal, atraumatic, no cyanosis or edema Pulses: 2+ and symmetric Skin: Skin color, texture, turgor normal. No rashes or lesions Neurologic: Grossly normal  EKG not performed -- personally reviewed   ASSESSMENT AND PLAN:   1. HTN: controlled on current regimen. 110/56 today. HR 72 bpm. Continue amlodipine, Cardura and Avapro. We will check BMP today given ARB therapy to check renal function and K.   2. 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 Radiologist recommended annual imaging f/u by CTA or MRA. We will order another chest CT to reevaluate. BP is well controlled.   3. Other Cardiac: h/o low risk NST in 2017 w/ normal EF. He denies CP and no exertional dyspnea walking flat surfaces or with ALDs. His exertional dyspnea walking up inclines and stairs is chronic and no changes in severity. This has been his baseline for years. I suspect likely due to obesity and deconditioning. No further cardiac w/u at this time.    Follow-Up in 6 months.   Shandiin Eisenbeis Ladoris Gene,  [facility] 07/21/2018 12:05 PM

## 2018-07-21 NOTE — Addendum Note (Signed)
Addended by: Aris Georgia, Lauris Keepers L on: 07/21/2018 01:48 PM   Modules accepted: Orders

## 2018-07-28 ENCOUNTER — Telehealth: Payer: Self-pay

## 2018-07-28 ENCOUNTER — Telehealth: Payer: Self-pay | Admitting: Cardiology

## 2018-07-28 ENCOUNTER — Other Ambulatory Visit: Payer: Medicare Other | Admitting: *Deleted

## 2018-07-28 DIAGNOSIS — Z79899 Other long term (current) drug therapy: Secondary | ICD-10-CM

## 2018-07-28 DIAGNOSIS — E876 Hypokalemia: Secondary | ICD-10-CM | POA: Diagnosis not present

## 2018-07-28 LAB — BASIC METABOLIC PANEL
BUN/Creatinine Ratio: 11 (ref 10–24)
BUN: 12 mg/dL (ref 8–27)
CO2: 26 mmol/L (ref 20–29)
Calcium: 8.6 mg/dL (ref 8.6–10.2)
Chloride: 102 mmol/L (ref 96–106)
Creatinine, Ser: 1.08 mg/dL (ref 0.76–1.27)
GFR calc Af Amer: 76 mL/min/{1.73_m2} (ref >59)
GFR calc non Af Amer: 65 mL/min/{1.73_m2} (ref >59)
Glucose: 87 mg/dL (ref 65–99)
Potassium: 3.1 mmol/L — ABNORMAL LOW (ref 3.5–5.2)
Sodium: 144 mmol/L (ref 134–144)

## 2018-07-28 MED ORDER — SPIRONOLACTONE 25 MG PO TABS: 12.5000 mg | ORAL_TABLET | Freq: Every day | ORAL | 3 refills | Status: DC

## 2018-07-28 MED ORDER — POTASSIUM CHLORIDE ER 10 MEQ PO TBCR: 20.0000 meq | EXTENDED_RELEASE_TABLET | Freq: Three times a day (TID) | ORAL | 11 refills | Status: DC

## 2018-07-28 MED ORDER — AMLODIPINE BESYLATE 5 MG PO TABS: 5.0000 mg | ORAL_TABLET | Freq: Every day | ORAL | 3 refills | Status: DC

## 2018-07-28 MED ORDER — IRBESARTAN 150 MG PO TABS: 150.0000 mg | ORAL_TABLET | Freq: Every day | ORAL | 11 refills | Status: DC

## 2018-07-28 NOTE — Telephone Encounter (Signed)
Patient had a 1 week f/u BMET his potassium on 9/6 3.3 and on 9/13 3.1. Spoke with Dr. Harrington Challenger (DOD), she advised to have an aldosterone, renin and BMET checked on 9/17.  Started: Aldactone 12.5 mg Increase: Potassium 10mg , 2 tablets, TID, by mouth Decrease: Irbesartan to 150 mg daily and amlodipine to 5 mg daily  Dr. Harrington Challenger wanted to schedule a nurse visit on Tuesday when they come in for labs.   Spoke with patient's wife, per dpr, she accepted all the medication changes and will relay the information to the patient.

## 2018-08-01 ENCOUNTER — Ambulatory Visit: Payer: Medicare Other

## 2018-08-01 ENCOUNTER — Other Ambulatory Visit: Payer: Medicare Other

## 2018-08-02 ENCOUNTER — Ambulatory Visit (INDEPENDENT_AMBULATORY_CARE_PROVIDER_SITE_OTHER): Payer: Medicare Other

## 2018-08-02 ENCOUNTER — Other Ambulatory Visit: Payer: Medicare Other | Admitting: *Deleted

## 2018-08-02 ENCOUNTER — Ambulatory Visit (INDEPENDENT_AMBULATORY_CARE_PROVIDER_SITE_OTHER)
Admission: RE | Admit: 2018-08-02 | Discharge: 2018-08-02 | Disposition: A | Discharge status: Home / Self Care | Payer: Medicare Other | Source: Ambulatory Visit | Attending: Cardiology | Admitting: Cardiology

## 2018-08-02 VITALS — BP 134/82 | Ht 68.0 in | Wt 210.4 lb

## 2018-08-02 DIAGNOSIS — E876 Hypokalemia: Secondary | ICD-10-CM

## 2018-08-02 DIAGNOSIS — I712 Thoracic aortic aneurysm, without rupture, unspecified: Secondary | ICD-10-CM

## 2018-08-02 DIAGNOSIS — Z79899 Other long term (current) drug therapy: Secondary | ICD-10-CM | POA: Diagnosis not present

## 2018-08-02 MED ORDER — IOPAMIDOL (ISOVUE-370) INJECTION 76%
100.0000 mL | Freq: Once | INTRAVENOUS | Status: AC | PRN
  Administered 2018-08-02: 100 mL via INTRAVENOUS

## 2018-08-02 NOTE — Progress Notes (Signed)
1.) Reason for visit: BP check, post medication changes  2.) Name of MD requesting visit: Dr. Harrington Challenger  3.) H&P: HTN, HLD, GERD, Aortic Aneurysm  4.) ROS related to problem: frequent urination and fatigue   5.) Assessment and plan per MD: The patient presented today feeling better since starting his new medications. He has had frequent urination, more than 15 times in a day. During the nurse visit he went to the bathroom 2 times. The patient has a pad on his bed to protect from an accident during the night. He has been feeling fatigued. Spoke with Dr. Burt Knack (DOD) he stated the blood pressure looks good, continue current therapy. Spoke with the patient, he accepted and expressed understanding.

## 2018-08-05 LAB — BASIC METABOLIC PANEL
BUN/Creatinine Ratio: 14 (ref 10–24)
BUN: 15 mg/dL (ref 8–27)
CO2: 26 mmol/L (ref 20–29)
Calcium: 9.2 mg/dL (ref 8.6–10.2)
Chloride: 104 mmol/L (ref 96–106)
Creatinine, Ser: 1.11 mg/dL (ref 0.76–1.27)
GFR calc Af Amer: 73 mL/min/{1.73_m2} (ref >59)
GFR calc non Af Amer: 63 mL/min/{1.73_m2} (ref >59)
Glucose: 96 mg/dL (ref 65–99)
Potassium: 4.2 mmol/L (ref 3.5–5.2)
Sodium: 141 mmol/L (ref 134–144)

## 2018-08-05 LAB — ALDOSTERONE + RENIN ACTIVITY W/ RATIO: ALDOSTERONE: 25.5 ng/dL (ref 0.0–30.0)

## 2018-08-18 MED ORDER — POTASSIUM CHLORIDE ER 10 MEQ PO TBCR: 20.0000 meq | EXTENDED_RELEASE_TABLET | Freq: Three times a day (TID) | ORAL | 3 refills | Status: DC

## 2018-08-18 NOTE — Addendum Note (Signed)
Addended by: Derl Barrow on: 08/18/2018 08:33 AM   Modules accepted: Orders

## 2018-08-18 NOTE — Telephone Encounter (Signed)
Pt called requesting a 90 day supply of his potassium medication, that was increased by Dr. Harrington Challenger (DOD). Pt's medication was resent in for 90 day supply, confirmation received.

## 2018-08-22 DIAGNOSIS — Z23 Encounter for immunization: Secondary | ICD-10-CM | POA: Diagnosis not present

## 2018-09-11 DIAGNOSIS — M25512 Pain in left shoulder: Secondary | ICD-10-CM | POA: Diagnosis not present

## 2018-09-11 DIAGNOSIS — M4692 Unspecified inflammatory spondylopathy, cervical region: Secondary | ICD-10-CM | POA: Diagnosis not present

## 2018-09-13 DIAGNOSIS — M1990 Unspecified osteoarthritis, unspecified site: Secondary | ICD-10-CM | POA: Diagnosis not present

## 2018-09-13 DIAGNOSIS — M109 Gout, unspecified: Secondary | ICD-10-CM | POA: Diagnosis not present

## 2018-09-13 DIAGNOSIS — I10 Essential (primary) hypertension: Secondary | ICD-10-CM | POA: Diagnosis not present

## 2018-09-13 DIAGNOSIS — N183 Chronic kidney disease, stage 3 (moderate): Secondary | ICD-10-CM | POA: Diagnosis not present

## 2018-09-13 DIAGNOSIS — I712 Thoracic aortic aneurysm, without rupture: Secondary | ICD-10-CM | POA: Diagnosis not present

## 2018-09-13 DIAGNOSIS — K219 Gastro-esophageal reflux disease without esophagitis: Secondary | ICD-10-CM | POA: Diagnosis not present

## 2018-09-13 DIAGNOSIS — J309 Allergic rhinitis, unspecified: Secondary | ICD-10-CM | POA: Diagnosis not present

## 2018-10-02 ENCOUNTER — Other Ambulatory Visit: Payer: Self-pay | Admitting: Internal Medicine

## 2018-10-05 ENCOUNTER — Telehealth: Payer: Self-pay | Admitting: Cardiology

## 2018-10-05 NOTE — Telephone Encounter (Signed)
Pt calling in to schedule an appt with Dr Meda Coffee or Ellen Henri for tomorrow, for he cannot come in today.  Pt states for a couple weeks he has been having headaches, and his last pressure taken was 170s/90s.  Pt reports he has mild CP at times, but no SOB, DOE, dizziness, N/V, pre-syncopal or syncopal episodes.  Pt states he thinks his K level is low again, (last checked was 9/18 and was normal at 4.2), for he states that last time it was low he had these symptoms.  Offered pt to come and see Dr Meda Coffee today, and he asked to be seen tomorrow and have labs done. Scheduled the pt to see Ellen Henri PA-C for tomorrow 11/22 at 0900.  Pt aware to arrive 15 mins prior to that appt.  Advised the pt to bring all his meds in with him.  Informed the pt that [place] will advise on needed labs or not at that visit.  Pt verbalized understanding and agrees with this plan.  Will forward this information to both [place] and Dr Meda Coffee as an Juluis Rainier.

## 2018-10-05 NOTE — Telephone Encounter (Signed)
New Message         Patient wants to come in today for labs due to not feeling well and having headaches. He thinks his potassium is low. Pls call and advise.

## 2018-10-06 ENCOUNTER — Telehealth: Payer: Self-pay

## 2018-10-06 ENCOUNTER — Encounter: Payer: Self-pay | Admitting: Cardiology

## 2018-10-06 ENCOUNTER — Ambulatory Visit (INDEPENDENT_AMBULATORY_CARE_PROVIDER_SITE_OTHER): Payer: Medicare Other | Admitting: Cardiology

## 2018-10-06 VITALS — BP 152/86 | HR 68 | Ht 68.0 in | Wt 212.4 lb

## 2018-10-06 DIAGNOSIS — I10 Essential (primary) hypertension: Secondary | ICD-10-CM

## 2018-10-06 DIAGNOSIS — R001 Bradycardia, unspecified: Secondary | ICD-10-CM | POA: Diagnosis not present

## 2018-10-06 LAB — BASIC METABOLIC PANEL
BUN/Creatinine Ratio: 11 (ref 10–24)
BUN: 14 mg/dL (ref 8–27)
CO2: 27 mmol/L (ref 20–29)
Calcium: 9.6 mg/dL (ref 8.6–10.2)
Chloride: 101 mmol/L (ref 96–106)
Creatinine, Ser: 1.24 mg/dL (ref 0.76–1.27)
GFR calc Af Amer: 64 mL/min/{1.73_m2} (ref >59)
GFR calc non Af Amer: 55 mL/min/{1.73_m2} — ABNORMAL LOW (ref >59)
Glucose: 92 mg/dL (ref 65–99)
Potassium: 4.4 mmol/L (ref 3.5–5.2)
Sodium: 141 mmol/L (ref 134–144)

## 2018-10-06 MED ORDER — AMLODIPINE BESYLATE 5 MG PO TABS: 5.0000 mg | ORAL_TABLET | Freq: Every day | ORAL | 3 refills | Status: DC

## 2018-10-06 MED ORDER — AMLODIPINE BESYLATE 10 MG PO TABS: 10.0000 mg | ORAL_TABLET | Freq: Every day | ORAL | 3 refills | Status: DC

## 2018-10-06 NOTE — Telephone Encounter (Signed)
PT  [place]./CY 

## 2018-10-06 NOTE — Progress Notes (Addendum)
10/06/2018 Bob Elliott   [birth year]  [id number]  Primary Physician Bob Low, MD Primary Cardiologist: Dr. Meda Elliott   Reason for Visit/CC: High Blood Pressure  HPI:  Bob Elliott a 77 y.o.male, followed by Dr. Meda Elliott, with a h/oHTN, Aortic Aneurysm, HLD, GERD, esophageal dysmotility, BPH and mild CPAP on study 08/2004 (not on CPAP). He had a negative stress test in 2098for pre-op clearance of neck surgery. His last echo was 08/2016 and showed normal LVEF at 55-60%, G1DD and mild AI. He had a CT scan of the chest in September for monitoring of his aortic aneurysm. This showed stable ascending thoracic aorta measuring 4.3 cm. Repeat study recommended in 1 yr.   He is back today for f/u due to concerns regarding high BP at home. He called the office yesterday to make the appointment.  He reports that his systolic blood pressures have been running high at home in the 150s to 160s.  This has been associated with headaches and dizziness.  He reports full medication compliance.  He is on amlodipine 2.5 mg, doxazosin 4 mg at bedtime, and irebesartan 150 mg daily and spironolactone 12.5 mg daily.  He is also on supplemental potassium.  We discussed diet.  He eats a lot of processed meats including bologna, liver pudding and bacon.  EKG today shows normal sinus rhythm with first-degree AV block and PACs.  Otherwise normal EKG.  Heart rate 68 bpm.  Blood pressure today in clinic is 158/86.  He denies chest pain or dyspnea.  No lower extremity edema on exam.    Current Meds  Medication Sig  . amLODipine (NORVASC) 5 MG tablet Take 1 tablet (5 mg total) by mouth daily.  [name] doxazosin (CARDURA) 4 MG tablet Take 4 mg by mouth at bedtime.   [name] esomeprazole (NEXIUM) 40 MG capsule Take 30-60 min before first meal of the day  . fluticasone (FLONASE) 50 MCG/ACT nasal spray Place 2 sprays into both nostrils daily as needed for allergies or rhinitis.   [name] irbesartan (AVAPRO) 150 MG tablet Take 1 tablet (150  mg total) by mouth daily.  [name] loratadine (CLARITIN) 10 MG tablet Take 10 mg by mouth daily.  . potassium chloride (K-DUR) 10 MEQ tablet Take 2 tablets (20 mEq total) by mouth 3 (three) times daily.  [name] spironolactone (ALDACTONE) 25 MG tablet Take 0.5 tablets (12.5 mg total) by mouth daily.  . timolol (TIMOPTIC) 0.5 % ophthalmic solution Place 1 drop into both eyes daily.    Allergies  Allergen Reactions  . Aspirin Hives, Itching and Nausea Only  . Codeine Hives, Itching and Nausea Only  . Shellfish Allergy Hives   Past Medical History:  Diagnosis Date  . Arthritis   . BPH (benign prostatic hyperplasia)   . Dizziness 10/21/2016  . DOE (dyspnea on exertion) 10/21/2016   Echo 09/06/16  - Left ventricle: The cavity size was normal. There was mild   hypertrophy of the septum with moderate hypertrophy of the   posterior wall. Systolic function was normal. The estimated   ejection fraction was in the range of 55% to 60%. Wall motion was   normal; there were no regional wall motion abnormalities. Doppler   parameters are consistent with abnormal left ventricular   rela  . Esophageal dysmotility   . Essential hypertension    Trial off acei 08/12/2017 for unexplained sob/ noct smothering   . GERD (gastroesophageal reflux disease)   . GI bleed   . Glaucoma  BOTH EYES  . Hyperlipidemia   . Hypertension   . Inguinal hernia without mention of obstruction or gangrene, unilateral or unspecified, (not specified as recurrent)-right s/p repair with mesh 05/21/2014  . Schatzki's ring   . Sinus bradycardia    a. prompting dc of Diltiazem (?chronotropic incompetence) in 2017.  [name] Sleep apnea    "mild" no cpap   Family History  Problem Relation Age of Onset  . Heart disease Mother   . Stroke Father   . Cancer Brother        colon  . Diabetes Sister   . Leukemia Maternal Grandmother    Past Surgical History:  Procedure Laterality Date  . COLONOSCOPY WITH PROPOFOL N/A 05/11/2016   Procedure:  COLONOSCOPY WITH PROPOFOL;  Surgeon: Garlan Fair, MD;  Location: [facility];  Service: Endoscopy;  Laterality: N/A;  . EYE SURGERY     CATARACT R  EYE  . HERNIA REPAIR     UMBILICAL  . INGUINAL HERNIA REPAIR Right 06/18/2014   Procedure: RIGHT INGUINAL HERNIA REPAIR;  Surgeon: Odis Hollingshead, MD;  Location: [facility];  Service: General;  Laterality: Right;  . INSERTION OF MESH Right 06/18/2014   Procedure: INSERTION OF MESH;  Surgeon: Odis Hollingshead, MD;  Location: [facility];  Service: General;  Laterality: Right;  . NECK SURGERY  2000   cervical disc surgery(limited ROM)- retained hardware  . TONSILLECTOMY     Social History   Socioeconomic History  . Marital status: Married    Spouse name: Not on file  . Number of children: 4  . Years of education: Not on file  . Highest education level: Not on file  Occupational History  . Occupation: retired  Scientific laboratory technician  . Financial resource strain: Not on file  . Food insecurity:    Worry: Not on file    Inability: Not on file  . Transportation needs:    Medical: Not on file    Non-medical: Not on file  Tobacco Use  . Smoking status: Never Smoker  . Smokeless tobacco: Never Used  Substance and Sexual Activity  . Alcohol use: Yes    Comment: none since 80's  . Drug use: No  . Sexual activity: Not Currently  Lifestyle  . Physical activity:    Days per week: Not on file    Minutes per session: Not on file  . Stress: Not on file  Relationships  . Social connections:    Talks on phone: Not on file    Gets together: Not on file    Attends religious service: Not on file    Active member of club or organization: Not on file    Attends meetings of clubs or organizations: Not on file    Relationship status: Not on file  . Intimate partner violence:    Fear of current or ex partner: Not on file    Emotionally abused: Not on file    Physically abused: Not on file    Forced sexual activity: Not on file  Other Topics Concern  . Not  on file  Social History Narrative  . Not on file     Review of Systems: General: negative for chills, fever, night sweats or weight changes.  Cardiovascular: negative for chest pain, dyspnea on exertion, edema, orthopnea, palpitations, paroxysmal nocturnal dyspnea or shortness of breath Dermatological: negative for rash Respiratory: negative for cough or wheezing Urologic: negative for hematuria Abdominal: negative for nausea, vomiting, diarrhea, bright red blood per  rectum, melena, or hematemesis Neurologic: negative for visual changes, syncope, or dizziness All other systems reviewed and are otherwise negative except as noted above.   Physical Exam:  Blood pressure (!) 152/86, pulse 68, height 5\' 8"  (1.727 m), weight 212 lb 6.4 oz (96.3 kg), SpO2 95 %.  General appearance: alert, cooperative, no distress and moderately obese Neck: no carotid bruit and no JVD Lungs: clear to auscultation bilaterally Abdomen: soft, non-tender; bowel sounds normal; no masses,  no organomegaly Extremities: extremities normal, atraumatic, no cyanosis or edema Pulses: 2+ and symmetric Skin: Skin color, texture, turgor normal. No rashes or lesions Neurologic: Grossly normal  EKG sinus rhythm with first-degree AV block and PACs.  68 bpm.-- personally reviewed   ASSESSMENT AND PLAN:   1. Hypertension: BP elevated today at 152/68 today.  Patient reports home blood pressure readings have also been elevated in the 856D to 149 systolic.  He reports full medication compliance.  He is on amlodipine 2.5 mg, doxazosin 4 mg at bedtime, and irbesartan 150 mg daily and spironolactone 12.5 mg daily.  He is also on supplemental potassium.  We discussed diet.  He eats a lot of processed meats including bologna, liver pudding and bacon. We discussed sodium restriction. We will check BMP today and will also plan to increase amlodipine to 5 mg daily. He will monitor BPs at home and will return in 2 weeks for clinic f/u.    2.  Thoracic Aortic Aneurysm: monitored yearly by CT. Study 07/2018 showed stable 4.23 cm ascending aorta. Repeat study recommended in 1 year. He needs better BP control. See above.   Follow-Up in 2 weeks to reassess BP.   Bob Elliott, MHS [facility] 10/06/2018 9:01 AM

## 2018-10-06 NOTE — Telephone Encounter (Signed)
PER PT WAS TAKING AMLODIPINE 5 MG 1/2 TAB INSTEAD OF 1/2 OF A 10 MG PER BRITTAINY SIMMONS PA HAVE PT TAKE AMLODIPINE 5 MG EVERY DAY .Adonis Housekeeper

## 2018-10-06 NOTE — Patient Instructions (Addendum)
Your physician has recommended you make the following change in your medication:   INCREASE AMLODIPINE TO 10 MG EVERY DAY   Your physician recommends that you return for lab work in:  [place] physician recommends that you schedule a follow-up appointment in:  [place]

## 2018-10-06 NOTE — Telephone Encounter (Signed)
The pt called the refill dept. asking questions about the dose change on his Amlodipine.  Please call the pt to discuss.

## 2018-10-09 ENCOUNTER — Telehealth: Payer: Self-pay

## 2018-10-09 NOTE — Telephone Encounter (Signed)
Notes recorded by Frederik Schmidt, RN on 10/09/2018 at 10:26 AM EST The patient has been notified of the result and verbalized understanding. All questions (if any) were answered. Frederik Schmidt, RN 10/09/2018 10:26 AM

## 2018-10-09 NOTE — Telephone Encounter (Signed)
-----   Message from Consuelo Pandy, [place] sent at 10/06/2018  5:00 PM EST ----- Kidney function and potassium levels both normal. This is good!

## 2018-10-23 ENCOUNTER — Encounter: Payer: Self-pay | Admitting: Cardiology

## 2018-10-23 ENCOUNTER — Ambulatory Visit (INDEPENDENT_AMBULATORY_CARE_PROVIDER_SITE_OTHER): Payer: Medicare Other | Admitting: Cardiology

## 2018-10-23 VITALS — BP 158/62 | HR 63 | Ht 68.0 in | Wt 211.8 lb

## 2018-10-23 DIAGNOSIS — I10 Essential (primary) hypertension: Secondary | ICD-10-CM | POA: Diagnosis not present

## 2018-10-23 MED ORDER — AMLODIPINE BESYLATE 10 MG PO TABS: 10.0000 mg | ORAL_TABLET | Freq: Every day | ORAL | 3 refills | Status: DC

## 2018-10-23 NOTE — Progress Notes (Signed)
10/23/2018 [name]   September 24, 1941  [id number]  Primary Physician [name], MD Primary Cardiologist: Dr. Meda Coffee    Reason for Visit/CC: F/u for HTN  HPI:  Bob Elliott is a 77 y.o. male who is being seen today for f/u for HTN.   He is followed by Dr. Meda Coffee, with a h/oHTN, Aortic Aneurysm,HLD, GERD, esophageal dysmotility, BPH and mild CPAP on study 08/2004 (not on CPAP). He had a negative stress test in 2080for pre-op clearance for neck surgery.His last echo was 08/2016 and showed normal LVEF at 55-60%, G1DD and mild AI. He had a CT scan of the chest in September for monitoring of his aortic aneurysm. This showed stable ascending thoracic aorta measuring 4.3 cm. Repeat study recommended in 1 yr.   He was seen 2 weeks ago given concerns regarding elevated BP readings at home. BP was elevated that day in clinic at 152/68 today.  Patient reported home blood pressure readings had also been elevated in the 144R to 154 systolic. He noted frequent HAs.  He reported full medication compliance.  He was on amlodipine 2.5 mg, doxazosin 4 mg at bedtime, and irbesartan 150 mg daily and spironolactone 12.5 mg daily.  We discussed diet.  He reported that he eats a lot of processed meats including bologna, liver pudding and bacon. We discussed sodium restriction. He was advised to reduce salt and intake of processed foods. I also increased his amlodipine to 5 mg daily.   He is back today for f/u. His BP remains elevated at 115/62. I rechecked his pressure after several min of rest and it was 145/80. He notes his SBPs at home have also been in the 140s. He reports full med compliance. He has been tolerating amlodipine 5 mg ok. No side effects. He has tried to cut back on his sodium. He denies CP and dyspnea.    Current Meds  Medication Sig  . doxazosin (CARDURA) 4 MG tablet Take 4 mg by mouth at bedtime.   [name] esomeprazole (NEXIUM) 40 MG capsule Take 30-60 min before first meal of the day  .  fluticasone (FLONASE) 50 MCG/ACT nasal spray Place 2 sprays into both nostrils daily as needed for allergies or rhinitis.   [name] irbesartan (AVAPRO) 150 MG tablet Take 1 tablet (150 mg total) by mouth daily.  [name] loratadine (CLARITIN) 10 MG tablet Take 10 mg by mouth daily.  . potassium chloride (K-DUR) 10 MEQ tablet Take 2 tablets (20 mEq total) by mouth 3 (three) times daily.  [name] spironolactone (ALDACTONE) 25 MG tablet Take 0.5 tablets (12.5 mg total) by mouth daily.  . timolol (TIMOPTIC) 0.5 % ophthalmic solution Place 1 drop into both eyes daily.   . [DISCONTINUED] amLODipine (NORVASC) 5 MG tablet Take 1 tablet (5 mg total) by mouth daily.   Allergies  Allergen Reactions  . Aspirin Hives, Itching and Nausea Only  . Codeine Hives, Itching and Nausea Only  . Shellfish Allergy Hives   Past Medical History:  Diagnosis Date  . Arthritis   . BPH (benign prostatic hyperplasia)   . Dizziness 10/21/2016  . DOE (dyspnea on exertion) 10/21/2016   Echo 09/06/16  - Left ventricle: The cavity size was normal. There was mild   hypertrophy of the septum with moderate hypertrophy of the   posterior wall. Systolic function was normal. The estimated   ejection fraction was in the range of 55% to 60%. Wall motion was   normal; there were no regional wall motion  abnormalities. Doppler   parameters are consistent with abnormal left ventricular   rela  . Esophageal dysmotility   . Essential hypertension    Trial off acei 08/12/2017 for unexplained sob/ noct smothering   . GERD (gastroesophageal reflux disease)   . GI bleed   . Glaucoma    BOTH EYES  . Hyperlipidemia   . Hypertension   . Inguinal hernia without mention of obstruction or gangrene, unilateral or unspecified, (not specified as recurrent)-right s/p repair with mesh 05/21/2014  . Schatzki's ring   . Sinus bradycardia    a. prompting dc of Diltiazem (?chronotropic incompetence) in 2017.  [name] Sleep apnea    "mild" no cpap   Family History  Problem  Relation Age of Onset  . Heart disease Mother   . Stroke Father   . Cancer Brother        colon  . Diabetes Sister   . Leukemia Maternal Grandmother    Past Surgical History:  Procedure Laterality Date  . COLONOSCOPY WITH PROPOFOL N/A 05/11/2016   Procedure: COLONOSCOPY WITH PROPOFOL;  Surgeon: Garlan Fair, MD;  Location: [facility];  Service: Endoscopy;  Laterality: N/A;  . EYE SURGERY     CATARACT R  EYE  . HERNIA REPAIR     UMBILICAL  . INGUINAL HERNIA REPAIR Right 06/18/2014   Procedure: RIGHT INGUINAL HERNIA REPAIR;  Surgeon: Odis Hollingshead, MD;  Location: [place];  Service: General;  Laterality: Right;  . INSERTION OF MESH Right 06/18/2014   Procedure: INSERTION OF MESH;  Surgeon: Odis Hollingshead, MD;  Location: [place];  Service: General;  Laterality: Right;  . NECK SURGERY  2000   cervical disc surgery(limited ROM)- retained hardware  . TONSILLECTOMY     Social History   Socioeconomic History  . Marital status: Married    Spouse name: Not on file  . Number of children: 4  . Years of education: Not on file  . Highest education level: Not on file  Occupational History  . Occupation: retired  Scientific laboratory technician  . Financial resource strain: Not on file  . Food insecurity:    Worry: Not on file    Inability: Not on file  . Transportation needs:    Medical: Not on file    Non-medical: Not on file  Tobacco Use  . Smoking status: Never Smoker  . Smokeless tobacco: Never Used  Substance and Sexual Activity  . Alcohol use: Yes    Comment: none since 80's  . Drug use: No  . Sexual activity: Not Currently  Lifestyle  . Physical activity:    Days per week: Not on file    Minutes per session: Not on file  . Stress: Not on file  Relationships  . Social connections:    Talks on phone: Not on file    Gets together: Not on file    Attends religious service: Not on file    Active member of club or organization: Not on file    Attends meetings of clubs or organizations:  Not on file    Relationship status: Not on file  . Intimate partner violence:    Fear of current or ex partner: Not on file    Emotionally abused: Not on file    Physically abused: Not on file    Forced sexual activity: Not on file  Other Topics Concern  . Not on file  Social History Narrative  . Not on file     Review of  Systems: General: negative for chills, fever, night sweats or weight changes.  Cardiovascular: negative for chest pain, dyspnea on exertion, edema, orthopnea, palpitations, paroxysmal nocturnal dyspnea or shortness of breath Dermatological: negative for rash Respiratory: negative for cough or wheezing Urologic: negative for hematuria Abdominal: negative for nausea, vomiting, diarrhea, bright red blood per rectum, melena, or hematemesis Neurologic: negative for visual changes, syncope, or dizziness All other systems reviewed and are otherwise negative except as noted above.   Physical Exam:  Blood pressure (!) 158/62, pulse 63, height 5\' 8"  (1.727 m), weight 211 lb 12.8 oz (96.1 kg), SpO2 98 %.  General appearance: alert, cooperative and no distress Neck: no carotid bruit and no JVD Lungs: clear to auscultation bilaterally Heart: regular rate and rhythm, S1, S2 normal, no murmur, click, rub or gallop Extremities: extremities normal, atraumatic, no cyanosis or edema Pulses: 2+ and symmetric Skin: Skin color, texture, turgor normal. No rashes or lesions Neurologic: Grossly normal  EKG not performed -- personally reviewed   ASSESSMENT AND PLAN:   1. Hypertension: BP elevated today at 158/62. I rechecked BP after several min of rest and BP was 145/80. Patient reports home blood pressure readings have also been elevated 681E systolic.  He reports full medication compliance. No CP or dyspnea. Volume stable. Lungs are CTAB. No LEE.  We will further increase amlodipine to 10 mg daily. Continue Irbesartan and spirolactone. He will continue to monitor BP at home and will  call if persistent hypertension (SBP >140s).    2. Thoracic Aortic Aneurysm: monitored yearly by CT. Study 07/2018 showed stable 4.23 cm ascending aorta. Repeat study recommended in 1 year. He needs better BP control. See above.    Follow-Up w/ Dr. Meda Coffee in 3 months.   Iyauna Sing Ladoris Gene, MHS [facility] 10/23/2018 12:00 PM

## 2018-10-23 NOTE — Patient Instructions (Signed)
Medication Instructions:  INCREASE Amlodipine to 10 mg DAILY  If you need a refill on your cardiac medications before your next appointment, please call your pharmacy.   Lab work: NONE If you have labs (blood work) drawn today and your tests are completely normal, you will receive your results only by: [name] MyChart Message (if you have MyChart) OR . A paper copy in the mail If you have any lab test that is abnormal or we need to change your treatment, we will call you to review the results.  Testing/Procedures: NONE  Follow-Up: IN [place] WITH Dr Meda Coffee  At [facility], you and your health needs are our priority.  As part of our continuing mission to provide you with exceptional heart care, we have created designated Provider Care Teams.  These Care Teams include your primary Cardiologist (physician) and Advanced Practice Providers (APPs -  Physician Assistants and Nurse Practitioners) who all work together to provide you with the care you need, when you need it. .   Any Other Special Instructions Will Be Listed Below (If Applicable). Monitor BP at home, if consistently stays greater than 140 (top number), call us

## 2018-11-02 ENCOUNTER — Other Ambulatory Visit: Payer: Self-pay | Admitting: Cardiology

## 2018-11-02 MED ORDER — SPIRONOLACTONE 25 MG PO TABS: 12.5000 mg | ORAL_TABLET | Freq: Every day | ORAL | 3 refills | Status: DC

## 2018-11-02 MED ORDER — IRBESARTAN 150 MG PO TABS: 150.0000 mg | ORAL_TABLET | Freq: Every day | ORAL | 3 refills | Status: DC

## 2018-11-02 MED ORDER — AMLODIPINE BESYLATE 10 MG PO TABS: 10.0000 mg | ORAL_TABLET | Freq: Every day | ORAL | 3 refills | Status: DC

## 2018-11-02 MED ORDER — POTASSIUM CHLORIDE ER 10 MEQ PO TBCR: 20.0000 meq | EXTENDED_RELEASE_TABLET | Freq: Three times a day (TID) | ORAL | 3 refills | Status: DC

## 2018-11-02 NOTE — Telephone Encounter (Signed)
Pt's medications were sent to pt's pharmacy as requested. Confirmation received.  

## 2018-11-06 ENCOUNTER — Telehealth: Payer: Self-pay | Admitting: *Deleted

## 2018-11-06 NOTE — Telephone Encounter (Signed)
Dr. Meda Coffee disregard this message.  I spoke with the pts other Pharmacy [facility], and they do have a supply of Irbesartan 150 mg po daily on hand and will mail this out to the pt tomorrow.  They will also refill all his other cardiac meds as well.  They faxed [place] an alert about the pt being allergic to potassium chloride, and this is not noted as an allergy in his chart, and the pt states he has "never had an allergy to KCL."  Endorsed to the [facility]ist at [facility] this as well.  With that said, the pharmacist at [facility] will be refilling all his cardiac meds and will have them shipped to his house by tomorrow.  Made the pt aware of this and he was very pleased to hear this news, for he states he does so well on irbesartan.  Pt verbalized understanding and agrees with this plan. Pt wants to make [facility] his preferred pharmacy of choice in his chart.  Change reflected in his chart, as requested.  Pt gracious for all the assistance provided.

## 2018-11-06 NOTE — Telephone Encounter (Signed)
Pts Pharmacy, [facility], faxed over a note to Dr Meda Coffee, about this pts Irbesartan 150 mg po daily.  Per [facility] pharmacy, pt requested a new refill of his Irbesartan, but this will need to be changed to something else, for this med is currently on backorder.  Will forward this request to Dr Meda Coffee to review and advise on.  Will follow-up with the pt and pharmacy thereafter, once new regimen is provided.  Dr Meda Coffee please advise.

## 2018-11-16 ENCOUNTER — Other Ambulatory Visit: Payer: Self-pay

## 2018-11-16 MED ORDER — IRBESARTAN 150 MG PO TABS: 150.0000 mg | ORAL_TABLET | Freq: Every day | ORAL | 0 refills | Status: DC

## 2018-11-20 ENCOUNTER — Other Ambulatory Visit: Payer: Self-pay | Admitting: Cardiology

## 2018-11-20 MED ORDER — IRBESARTAN 150 MG PO TABS: 150.0000 mg | ORAL_TABLET | Freq: Every day | ORAL | 3 refills | Status: DC

## 2018-11-20 NOTE — Telephone Encounter (Signed)
Pt's medication was sent to pt's pharmacy as requested. Confirmation received.  °

## 2018-11-20 NOTE — Telephone Encounter (Signed)
New message    Dot pharses not loaded      Pt needs irbesartan (AVAPRO) 150 MG tablet refilled .   [facility] uses [facility]    pt was taking 1 1/2 tabs a day instead of what was prescribed   Pt switched insurance from  [employer] to [employer]   [facility] stated [facility] said they will mail out medication on 1/7 but pt is completely out.    Not sure what day supply

## 2018-11-21 DIAGNOSIS — H40053 Ocular hypertension, bilateral: Secondary | ICD-10-CM | POA: Diagnosis not present

## 2018-11-21 DIAGNOSIS — H40013 Open angle with borderline findings, low risk, bilateral: Secondary | ICD-10-CM | POA: Diagnosis not present

## 2018-12-08 DIAGNOSIS — E876 Hypokalemia: Secondary | ICD-10-CM | POA: Diagnosis not present

## 2018-12-08 DIAGNOSIS — I10 Essential (primary) hypertension: Secondary | ICD-10-CM | POA: Diagnosis not present

## 2018-12-08 DIAGNOSIS — K219 Gastro-esophageal reflux disease without esophagitis: Secondary | ICD-10-CM | POA: Diagnosis not present

## 2019-02-01 ENCOUNTER — Telehealth: Payer: Self-pay | Admitting: Cardiology

## 2019-02-01 NOTE — Telephone Encounter (Signed)
   Primary Cardiologist:  [name], MD   Patient contacted.  History reviewed.  No symptoms to suggest any unstable cardiac conditions.  Based on discussion, with current pandemic situation, we will be postponing this appointment for >12 wks. The patient states that his blood pressure has been controlled and he currently has no cardiac symptoms. Please make sure he has all the refills.   [name], MD  02/01/2019 6:45 PM         .

## 2019-02-02 NOTE — Telephone Encounter (Signed)
Pt states he is good on refills at this time.  Pt states he just ordered them through his mail ordering pharmacy service.  Pt very appreciative for all the assistance provided.

## 2019-02-12 ENCOUNTER — Ambulatory Visit: Payer: Medicare Other | Admitting: Cardiology

## 2019-02-16 ENCOUNTER — Other Ambulatory Visit: Payer: Self-pay | Admitting: Cardiology

## 2019-02-16 NOTE — Telephone Encounter (Signed)
[facility] mail order pharmacy is requesting a refill on timolol. Would Dr. Meda Coffee like to refill this medication? Please address

## 2019-02-16 NOTE — Telephone Encounter (Signed)
Please refer this to the pts PCP.  Thanks

## 2019-03-19 ENCOUNTER — Other Ambulatory Visit: Payer: Self-pay | Admitting: Internal Medicine

## 2019-03-19 DIAGNOSIS — I712 Thoracic aortic aneurysm, without rupture, unspecified: Secondary | ICD-10-CM

## 2019-03-19 DIAGNOSIS — D72819 Decreased white blood cell count, unspecified: Secondary | ICD-10-CM | POA: Diagnosis not present

## 2019-03-19 DIAGNOSIS — I10 Essential (primary) hypertension: Secondary | ICD-10-CM | POA: Diagnosis not present

## 2019-03-19 DIAGNOSIS — N400 Enlarged prostate without lower urinary tract symptoms: Secondary | ICD-10-CM | POA: Diagnosis not present

## 2019-03-19 DIAGNOSIS — E782 Mixed hyperlipidemia: Secondary | ICD-10-CM | POA: Diagnosis not present

## 2019-03-19 DIAGNOSIS — E876 Hypokalemia: Secondary | ICD-10-CM | POA: Diagnosis not present

## 2019-03-19 DIAGNOSIS — M47812 Spondylosis without myelopathy or radiculopathy, cervical region: Secondary | ICD-10-CM | POA: Diagnosis not present

## 2019-03-19 DIAGNOSIS — Z1389 Encounter for screening for other disorder: Secondary | ICD-10-CM | POA: Diagnosis not present

## 2019-03-19 DIAGNOSIS — Z0000 Encounter for general adult medical examination without abnormal findings: Secondary | ICD-10-CM | POA: Diagnosis not present

## 2019-03-19 DIAGNOSIS — N182 Chronic kidney disease, stage 2 (mild): Secondary | ICD-10-CM | POA: Diagnosis not present

## 2019-03-19 DIAGNOSIS — R7309 Other abnormal glucose: Secondary | ICD-10-CM | POA: Diagnosis not present

## 2019-03-19 DIAGNOSIS — M109 Gout, unspecified: Secondary | ICD-10-CM | POA: Diagnosis not present

## 2019-03-20 DIAGNOSIS — R7309 Other abnormal glucose: Secondary | ICD-10-CM | POA: Diagnosis not present

## 2019-03-20 DIAGNOSIS — N400 Enlarged prostate without lower urinary tract symptoms: Secondary | ICD-10-CM | POA: Diagnosis not present

## 2019-03-20 DIAGNOSIS — I10 Essential (primary) hypertension: Secondary | ICD-10-CM | POA: Diagnosis not present

## 2019-03-20 DIAGNOSIS — E782 Mixed hyperlipidemia: Secondary | ICD-10-CM | POA: Diagnosis not present

## 2019-03-21 ENCOUNTER — Telehealth: Payer: Self-pay | Admitting: Physician Assistant

## 2019-03-21 NOTE — Telephone Encounter (Signed)
Patient set up for MyChart? NO  Is patient using Smartphone/computer/tablet? IPHONE  Did audio/video work? PT SAID YES HE IS FAMILIAR WITH [employer]XIMITY [employer]ATFORM  Does patient need telephone visit? NO  Best phone number to use? [phone]  Special Instructions? PATIENT WILL HAVE VITALS THE MORNING OF HIS APPT      Virtual Visit Pre-Appointment Phone Call  "(Name), I am calling you today to discuss your upcoming appointment. We are currently trying to limit exposure to the virus that causes COVID-19 by seeing patients at home rather than in the office."  1. "What is the BEST phone number to call the day of the visit?" - include this in appointment notes  2. Do you have or have access to (through a family member/friend) a smartphone with video capability that we can use for your visit?" a. If yes - list this number in appt notes as cell (if different from BEST phone #) and list the appointment type as a VIDEO visit in appointment notes b. If no - list the appointment type as a PHONE visit in appointment notes  3. Confirm consent - "In the setting of the current Covid19 crisis, you are scheduled for a (phone or video) visit with your provider on (date) at (time).  Just as we do with many in-office visits, in order for you to participate in this visit, we must obtain consent.  If you'd like, I can send this to your mychart (if signed up) or email for you to review.  Otherwise, I can obtain your verbal consent now.  All virtual visits are billed to your insurance company just like a normal visit would be.  By agreeing to a virtual visit, we'd like you to understand that the technology does not allow for your provider to perform an examination, and thus may limit your provider's ability to fully assess your condition. If your provider identifies any concerns that need to be evaluated in person, we will make arrangements to do so.  Finally, though the technology is pretty good, we cannot assure  that it will always work on either your or our end, and in the setting of a video visit, we may have to convert it to a phone-only visit.  In either situation, we cannot ensure that we have a secure connection.  Are you willing to proceed?" STAFF: Did the patient verbally acknowledge consent to telehealth visit? Document YES/NO here: YES  4. Advise patient to be prepared - "Two hours prior to your appointment, go ahead and check your blood pressure, pulse, oxygen saturation, and your weight (if you have the equipment to check those) and write them all down. When your visit starts, your provider will ask you for this information. If you have an Apple Watch or Kardia device, please plan to have heart rate information ready on the day of your appointment. Please have a pen and paper handy nearby the day of the visit as well."  5. Give patient instructions for MyChart download to smartphone OR Doximity/Doxy.me as below if video visit (depending on what platform provider is using)  6. Inform patient they will receive a phone call 15 minutes prior to their appointment time (may be from unknown caller ID) so they should be prepared to answer    TELEPHONE CALL NOTE  Bob Elliott has been deemed a candidate for a follow-up tele-health visit to limit community exposure during the Covid-19 pandemic. I spoke with the patient via phone to ensure availability of phone/video source,  confirm preferred email & phone number, and discuss instructions and expectations.  I reminded Bob Elliott to be prepared with any vital sign and/or heart rhythm information that could potentially be obtained via home monitoring, at the time of his visit. I reminded Bob Elliott to expect a phone call prior to his visit.  Bob Elliott 03/21/2019 3:52 PM   INSTRUCTIONS FOR DOWNLOADING THE MYCHART APP TO SMARTPHONE  - The patient must first make sure to have activated MyChart and know their login information - If Apple, go to  [employer] and type in MyChart in the search bar and download the app. If Android, ask patient to go to [employer] and type in [place] in the search bar and download the app. The app is free but as with any other app downloads, their phone may require them to verify saved payment information or Apple/Android password.  - The patient will need to then log into the app with their MyChart username and password, and select [facility] as their healthcare provider to link the account. When it is time for your visit, go to the MyChart app, find appointments, and click Begin Video Visit. Be sure to Select Allow for your device to access the Microphone and Camera for your visit. You will then be connected, and your provider will be with you shortly.  **If they have any issues connecting, or need assistance please contact MyChart service desk [phone])**  **If using a computer, in order to ensure the best quality for their visit they will need to use either of the following Internet Browsers: [employer], or Google Chrome**  IF USING DOXIMITY or DOXY.ME - The patient will receive a link just prior to their visit by text.     FULL LENGTH CONSENT FOR TELE-HEALTH VISIT   I hereby voluntarily request, consent and authorize [facility] and its employed or contracted physicians, physician assistants, nurse practitioners or other licensed health care professionals (the Practitioner), to provide me with telemedicine health care services (the Services") as deemed necessary by the treating Practitioner. I acknowledge and consent to receive the Services by the Practitioner via telemedicine. I understand that the telemedicine visit will involve communicating with the Practitioner through live audiovisual communication technology and the disclosure of certain medical information by electronic transmission. I acknowledge that I have been given the opportunity to request an in-person assessment  or other available alternative prior to the telemedicine visit and am voluntarily participating in the telemedicine visit.  I understand that I have the right to withhold or withdraw my consent to the use of telemedicine in the course of my care at any time, without affecting my right to future care or treatment, and that the Practitioner or I may terminate the telemedicine visit at any time. I understand that I have the right to inspect all information obtained and/or recorded in the course of the telemedicine visit and may receive copies of available information for a reasonable fee.  I understand that some of the potential risks of receiving the Services via telemedicine include:   Delay or interruption in medical evaluation due to technological equipment failure or disruption;  Information transmitted may not be sufficient (e.g. poor resolution of images) to allow for appropriate medical decision making by the Practitioner; and/or   In rare instances, security protocols could fail, causing a breach of personal health information.  Furthermore, I acknowledge that it is my responsibility to provide information about my medical history,  conditions and care that is complete and accurate to the best of my ability. I acknowledge that Practitioner's advice, recommendations, and/or decision may be based on factors not within their control, such as incomplete or inaccurate data provided by me or distortions of diagnostic images or specimens that may result from electronic transmissions. I understand that the practice of medicine is not an exact science and that Practitioner makes no warranties or guarantees regarding treatment outcomes. I acknowledge that I will receive a copy of this consent concurrently upon execution via email to the email address I last provided but may also request a printed copy by calling the office of [facility].    I understand that my insurance will be billed for this visit.    I have read or had this consent read to me.  I understand the contents of this consent, which adequately explains the benefits and risks of the Services being provided via telemedicine.   I have been provided ample opportunity to ask questions regarding this consent and the Services and have had my questions answered to my satisfaction.  I give my informed consent for the services to be provided through the use of telemedicine in my medical care  By participating in this telemedicine visit I agree to the above.

## 2019-03-26 ENCOUNTER — Ambulatory Visit
Admission: RE | Admit: 2019-03-26 | Discharge: 2019-03-26 | Disposition: A | Discharge status: Home / Self Care | Payer: Medicare Other | Source: Ambulatory Visit | Attending: Internal Medicine | Admitting: Internal Medicine

## 2019-03-26 ENCOUNTER — Encounter: Payer: Self-pay | Admitting: Physician Assistant

## 2019-03-26 DIAGNOSIS — R079 Chest pain, unspecified: Secondary | ICD-10-CM | POA: Diagnosis not present

## 2019-03-26 DIAGNOSIS — I712 Thoracic aortic aneurysm, without rupture, unspecified: Secondary | ICD-10-CM

## 2019-03-26 MED ORDER — IOPAMIDOL (ISOVUE-370) INJECTION 76%
75.0000 mL | Freq: Once | INTRAVENOUS | Status: AC | PRN
  Administered 2019-03-26: 75 mL via INTRAVENOUS

## 2019-03-26 NOTE — Progress Notes (Signed)
Virtual Visit via Telephone Note   This visit type was conducted due to national recommendations for restrictions regarding the COVID-19 Pandemic (e.g. social distancing) in an effort to limit this patient's exposure and mitigate transmission in our community.  Due to his co-morbid illnesses, this patient is at least at moderate risk for complications without adequate follow up.  This format is felt to be most appropriate for this patient at this time.  The patient did not have access to video technology/had technical difficulties with video requiring transitioning to audio format only (telephone).  All issues noted in this document were discussed and addressed.  No physical exam could be performed with this format.  Please refer to the patient's chart for his  consent to telehealth for [facility].   Date:  03/28/2019   ID:  Bob Elliott, DOB 08/13/1941, MRN [id number]  Patient Location: Home Provider Location: Home  PCP:  [name], MD  Cardiologist:  [name], MD  Electrophysiologist:  None   Evaluation Performed:  Follow-Up Visit  Chief Complaint: 6 month f/u HTN, TAA  History of Present Illness:    Bob Elliott is a 78 y.o. 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   He has a history of atypical chest pain in 2009, ruled out for MI, CTA neg for PE. He was seen in the office in 2017 for exertional dyspnea and felt to be volume overloaded on exam with edema. There was a recommendation to start Lasix 40mg  daily for concern for right heart failure but this was not actually prescribed. BNP and Hgb were normal. He also had some dizziness  with sinus bradycardia/1st degree AVB so Cardizem was decreased. 2D echo 09/06/16 showed mild LVH of septum, mod hypertrophy of posterior wall, EF 55-60%, grade 1 DD, mild AI, trivial MR, mild RAE. Nuc 09/06/16 showed inferior and apical thinning consistent with soft tissue attenuation of diaphragm, cannot exclude small subendocardial scar, no ischemia, EF 53%. When seen back in clinic 08/2016 his dyspnea had improved without intervention. It was noted that his pulse did not rise much with ambulation (55 to 57bpm with ambulation, pulse ox 96%). Given question of chronotropic incompetence & high BP contributing to his symptoms, his diltiazem was changed to amlodipine. HCTZ was previously discontinued due to rise in SCr. Last labs 03/2019 (PCP [facility]) A1C 5.3, albumin 4.0 normal LFTs, BUN 16, Cr 1.32, K 4.0, LDL 90, plt 183, Hgb 15.1, Hct 44.4, 07/2018 aldosterone normal, 06/2018 BNP WNL,  2017 TSH wnl. His PCP repeated CT scan 03/26/19 showed stable 4.1x4.0 TAA with minimal atherosclerosis, and diffuse bilateral bronchial wall thickening, consistent with nonspecific infectious or inflammatory bronchitis.   He returns for follow-up overall stable from cardiac standpoint. He denies any exertional angina. His chronic DOE is stable over the last several years without significant change. He is still able to mow the lawn. He generally feels good after doing so. Other than that he doesn't do a whole lot of activity. Denies orthopnea, edema or significant weight gain. He states PCP will be sending for further testing given CT scan findings. He has occasional cough which he relates to  esophageal issues, chronic. BP running 118-130's at home. He denies any fevers or chills.    Past Medical History:  Diagnosis Date   Arthritis    BPH (benign prostatic hyperplasia)    CKD (chronic kidney disease), stage II    a. CKD II-III by labs.   Dizziness 10/21/2016   Esophageal dysmotility    Essential hypertension    Trial  off acei 08/12/2017 for unexplained sob/ noct smothering    GERD (gastroesophageal reflux disease)    GI bleed    Glaucoma    BOTH EYES   Hyperlipidemia    Hypertension    Inguinal hernia without mention of obstruction or gangrene, unilateral or unspecified, (not specified as recurrent)-right s/p repair with mesh 05/21/2014   Schatzki's ring    Sinus bradycardia    a. prompting dc of Diltiazem (?chronotropic incompetence) in 2017.   Sleep apnea    "mild" no cpap   Thoracic aortic aneurysm (TAA) ([place])    Past Surgical History:  Procedure Laterality Date   COLONOSCOPY WITH PROPOFOL N/A 05/11/2016   Procedure: COLONOSCOPY WITH PROPOFOL;  Surgeon: Garlan Fair, MD;  Location: [facility];  Service: Endoscopy;  Laterality: N/A;   EYE SURGERY     CATARACT R  EYE   HERNIA REPAIR     UMBILICAL   INGUINAL HERNIA REPAIR Right 06/18/2014   Procedure: RIGHT INGUINAL HERNIA REPAIR;  Surgeon: Odis Hollingshead, MD;  Location: [facility];  Service: General;  Laterality: Right;   INSERTION OF MESH Right 06/18/2014   Procedure: INSERTION OF MESH;  Surgeon: Odis Hollingshead, MD;  Location: [facility];  Service: General;  Laterality: Right;   NECK SURGERY  2000   cervical disc surgery(limited ROM)- retained hardware   TONSILLECTOMY       Current Meds  Medication Sig   amLODipine (NORVASC) 10 MG tablet Take 1 tablet (10 mg total) by mouth daily.   doxazosin (CARDURA) 4 MG tablet Take 4 mg by mouth at bedtime.    esomeprazole (NEXIUM) 40 MG capsule Take 30-60 min before first meal of the day   fluticasone (FLONASE) 50 MCG/ACT nasal spray Place 2 sprays into both nostrils daily as needed for allergies or rhinitis.    irbesartan (AVAPRO) 150 MG tablet Take 1 tablet (150 mg total) by mouth daily.   loratadine (CLARITIN) 10 MG tablet Take 10 mg by mouth daily.   potassium chloride (K-DUR) 10 MEQ tablet Take 2 tablets (20 mEq total) by mouth 3 (three) times daily.   spironolactone  (ALDACTONE) 25 MG tablet Take 0.5 tablets (12.5 mg total) by mouth daily.   timolol (TIMOPTIC) 0.5 % ophthalmic solution Place 1 drop into both eyes daily.      Allergies:   Aspirin; Codeine; and Shellfish allergy   Social History   Tobacco Use   Smoking status: Never Smoker   Smokeless tobacco: Never Used  Substance Use Topics   Alcohol use: Yes    Comment: none since 80's   Drug use: No     Family Hx: The patient's family history includes Cancer in his brother; Diabetes in his sister; Heart disease in his mother; Leukemia in his maternal grandmother; Stroke in his father.  ROS:   Please see the history of present illness. He is on PPI for occasional indigestion immediately only after eating but this is chronic and unchanged. No CP with activities. All other systems reviewed and are negative.   Prior CV studies:   Most recent pertinent cardiac studies  are outlined above.   Labs/Other Tests and Data Reviewed:    EKG:  An ECG dated 10/06/18 was personally reviewed today and demonstrated:  NSR 68bpm, first degree AVB, one PAC  Recent Labs: 07/05/2018: B Natriuretic Peptide 43.8; Hemoglobin 16.2; Platelets 183 10/06/2018: BUN 14; Creatinine, Ser 1.24; Potassium 4.4; Sodium 141   Wt Readings from Last 3 Encounters:  03/28/19 210 lb (95.3 kg)  10/23/18 211 lb 12.8 oz (96.1 kg)  10/06/18 212 lb 6.4 oz (96.3 kg)     Objective:    Vital Signs:  BP 134/78    Pulse 73    Ht 5\' 8"  (1.727 m)    Wt 210 lb (95.3 kg)    BMI 31.93 kg/m    VITAL SIGNS:  reviewed  General - male in no acute distress Pulm - No labored breathing, no coughing during visit, no audible wheezing, speaking in full sentences Neuro - A+Ox3, no slurred speech, answers questions appropriately Psych - Pleasant affect MSK - moves all extremities spontaneously    ASSESSMENT & PLAN:    1. Essential HTN - generally controlled. The patient was instructed to monitor their blood pressure at home and to call  if tending to run higher than 130/80 given TAA. Also discussed importance of sodium reduction given hx of eating processed meats. 2. Sinus bradycardia - HR has been normal off diltiazem lately. No dizziness or syncope. 3. Chronic dyspnea - prior cardiac workup generally unrevealing. There may be a question of chronotropic incompetence but baseline HR is now better off diltiazem. He also indicates he doesn't do a whole lot otherwise so may be component of deconditioning. No acute change recently except CT did show some question of infectious/inflammatory process and pt indicates further testing is pending (he's not sure what). He indicates he will have PCP keep [place] in the loop of findings. The patient will also notify us if symptoms change or worsen at which time we can think about cardiac CTA for screening purposes. Of note, prior BNP levels for this symptom have been normal. 4. Thoracic aortic aneurysm - aneurysm precautions reviewed with patient. 5. Mild CKD - recent labs by primary care indicated general stability and normal potassium.   COVID-19 Education: The signs and symptoms of COVID-19 were discussed with the patient and how to seek care for testing (follow up with PCP or arrange E-visit).  The importance of social distancing was discussed today.  Time: Today, I have spent 16 minutes with the patient with telehealth technology discussing the above problems.     Medication Adjustments/Labs and Tests Ordered: Current medicines are reviewed at length with the patient today.  Concerns regarding medicines are outlined above.    Disposition:  Follow up 6 months with Dr. Meda Coffee  Signed, Charlie Pitter, PA-C  03/28/2019 10:00 AM    [place]

## 2019-03-28 ENCOUNTER — Other Ambulatory Visit: Payer: Self-pay

## 2019-03-28 ENCOUNTER — Encounter: Payer: Self-pay | Admitting: Physician Assistant

## 2019-03-28 ENCOUNTER — Telehealth (INDEPENDENT_AMBULATORY_CARE_PROVIDER_SITE_OTHER): Payer: Medicare Other | Admitting: Physician Assistant

## 2019-03-28 VITALS — BP 134/78 | HR 73 | Ht 68.0 in | Wt 210.0 lb

## 2019-03-28 DIAGNOSIS — I712 Thoracic aortic aneurysm, without rupture, unspecified: Secondary | ICD-10-CM

## 2019-03-28 DIAGNOSIS — I10 Essential (primary) hypertension: Secondary | ICD-10-CM

## 2019-03-28 DIAGNOSIS — R0609 Other forms of dyspnea: Secondary | ICD-10-CM

## 2019-03-28 DIAGNOSIS — R001 Bradycardia, unspecified: Secondary | ICD-10-CM

## 2019-03-28 DIAGNOSIS — N182 Chronic kidney disease, stage 2 (mild): Secondary | ICD-10-CM

## 2019-03-28 NOTE — Patient Instructions (Addendum)
Medication Instructions:  Your physician recommends that you continue on your current medications as directed. Please refer to the Current Medication list given to you today.  If you need a refill on your cardiac medications before your next appointment, please call your pharmacy.   Lab work: None ordered  If you have labs (blood work) drawn today and your tests are completely normal, you will receive your results only by: [name] MyChart Message (if you have MyChart) OR . A paper copy in the mail If you have any lab test that is abnormal or we need to change your treatment, we will call you to review the results.  Testing/Procedures: None ordered  Follow-Up: At [facility], you and your health needs are our priority.  As part of our continuing mission to provide you with exceptional heart care, we have created designated Provider Care Teams.  These Care Teams include your primary Cardiologist (physician) and Advanced Practice Providers (APPs -  Physician Assistants and Nurse Practitioners) who all work together to provide you with the care you need, when you need it. You will need a follow up appointment in 6 months.  Please call our office 2 months in advance to schedule this appointment.  You may see [name], MD or one of the following Advanced Practice Providers on your designated Care Team:   [place], PA-C Melina Copa, PA-C . Ermalinda Barrios, PA-C  Any Other Special Instructions Will Be Listed Below (If Applicable). Monitor your blood pressure periodically.  If you notice the readings running higher than 130/80, please call your doctor office and notify them.   Mainstays of therapy for aneurysms include good blood pressure control, healthy lifestyle, and avoiding fluoroquinolone antibiotic medications (such as those in the "Cipro" class, ending in "floxacin") due to risk of damage to the aorta. This is a finding I would expect to be monitored periodically by their primary  cardiologist. Since aneurysms can run in families, you should discuss your diagnosis with first degree relatives as they may need to be screened for this. Regular mild-moderate physical exercise is OK but avoid heavy lifting/weight lifting over 30lbs, chopping wood, shoveling snow or digging heavy earth with a shovel. I typically refer patients to this flyer (https://medicine.Lease[employer]uru.tn.pdf) for more information, keeping in mind of some of this information also pertains to people who have had surgery for this.

## 2019-04-17 ENCOUNTER — Encounter: Payer: Self-pay | Admitting: Internal Medicine

## 2019-04-17 ENCOUNTER — Other Ambulatory Visit: Payer: Self-pay

## 2019-04-17 ENCOUNTER — Ambulatory Visit (INDEPENDENT_AMBULATORY_CARE_PROVIDER_SITE_OTHER): Payer: Medicare Other | Admitting: Internal Medicine

## 2019-04-17 VITALS — BP 124/62 | HR 64 | Temp 97.9°F | Ht 68.0 in | Wt 209.2 lb

## 2019-04-17 DIAGNOSIS — R0609 Other forms of dyspnea: Secondary | ICD-10-CM | POA: Diagnosis not present

## 2019-04-17 DIAGNOSIS — I10 Essential (primary) hypertension: Secondary | ICD-10-CM

## 2019-04-17 DIAGNOSIS — R058 Other specified cough: Secondary | ICD-10-CM | POA: Insufficient documentation

## 2019-04-17 DIAGNOSIS — R0600 Dyspnea, unspecified: Secondary | ICD-10-CM

## 2019-04-17 DIAGNOSIS — R05 Cough: Secondary | ICD-10-CM

## 2019-04-17 NOTE — Assessment & Plan Note (Signed)
Echo 09/06/16  - Left ventricle: The cavity size was normal. There was mild   hypertrophy of the septum with moderate hypertrophy of the   posterior wall. Systolic function was normal. The estimated   ejection fraction was in the range of 55% to 60%. Wall motion was   normal; there were no regional wall motion abnormalities. Doppler   parameters are consistent with abnormal left ventricular   relaxation (grade 1 diastolic dysfunction). Doppler parameters   are consistent with indeterminate ventricular filling pressure. - Aortic valve: Transvalvular velocity was within the normal range.   There was no stenosis. There was mild regurgitation.   Regurgitation pressure half-time: 554 ms. - Mitral valve: Transvalvular velocity was within the normal range.   There was no evidence for stenosis. There was trivial   regurgitation. Valve area by pressure half-time: 1.85 cm^2. Valve   area by continuity equation (using LVOT flow): 2.06 cm^2. - Right ventricle: The cavity size was normal. Wall thickness was   normal. Systolic function was normal. - Right atrium: The atrium was mildly dilated. - Tricuspid valve: There was no regurgitation. 08/12/2017  Walked RA x 3 laps @ 185 ft each stopped due to  End of study, nl pace, no   Desat/ min sob/ pseudowheeze noted at end - Trial off acei 08/12/2017      04/17/2019   Walked RA  2 laps @  approx 264ft each @ fast pace  stopped due to  End of study, no sob or desats   - 04/17/2019 max rx for GERD (no mint)    Not able to reproduce chronic doe in office and if anything is better off than 2 years ago with pseudowheeze while on ACEi then but still now urge to clear throat (see uacs a/p)   rec Reconditioning by submaximal aerobic ex 30 min daily  Consider cpst if not improving with this and rx of uacs.

## 2019-04-17 NOTE — Patient Instructions (Signed)
Continue nexium 40 mg Take 30-60 min before first meal of the day and then add pepcid 20 mg one after supper   GERD (REFLUX)  is an extremely common cause of respiratory symptoms just like yours , many times with no obvious heartburn at all.    It can be treated with medication, but also with lifestyle changes including elevation of the head of your bed (ideally with 6 -8inch blocks under the headboard of your bed),  Smoking cessation, avoidance of late meals, excessive alcohol, and avoid fatty foods, chocolate, peppermint, colas, red wine, and acidic juices such as orange juice.  NO MINT OR MENTHOL PRODUCTS SO NO COUGH DROPS  USE SUGARLESS CANDY INSTEAD (Jolley ranchers or Stover's or Life Savers) or even ice chips will also do - the key is to swallow to prevent all throat clearing. NO OIL BASED VITAMINS - use powdered substitutes.  Avoid fish oil when coughing.   If you are satisfied with your treatment plan,  let your doctor know and he/she can either refill your medications or you can return here when your prescription runs out.     If in any way you are not 100% satisfied,  please tell us.  If 100% better, tell your friends!  Pulmonary follow up is as needed

## 2019-04-17 NOTE — Assessment & Plan Note (Signed)
Upper airway cough syndrome (previously labeled PNDS),  is so named because it's frequently impossible to sort out how much is  CR/sinusitis with freq throat clearing (which can be related to primary GERD)   vs  causing  secondary (" extra esophageal")  GERD from wide swings in gastric pressure that occur with throat clearing, often  promoting self use of mint and menthol lozenges that reduce the lower esophageal sphincter tone and exacerbate the problem further in a cyclical fashion.   These are the same pts (now being labeled as having "irritable larynx syndrome" by some cough centers) who not infrequently have a history of having failed to tolerate ace inhibitors(clearly the case here) ,  dry powder inhalers or biphosphonates or report having atypical/extraesophageal reflux symptoms that don't respond to standard doses of PPI (esp if pt uses lots of peppermint as is the case here)  and are easily confused as having aecopd or asthma flares by even experienced allergists/ pulmonologists (myself included).    >>> rec max rx for gerd and consider trial of gabapentin if needed to eliminate cyclical cough (similar to the scratch itch cycle)   I had an extended discussion with the patient reviewing all relevant studies completed to date and  lasting 25 minutes of a 40  minute exteneded f/u office visit adressing     re  severe non-specific but potentially very serious refractory respiratory symptoms of uncertain and potentially multiple  Etiologies.   I directly observed portions of ambulatory 02 saturation study which extended face to face time for this ov    Each maintenance medication was reviewed in detail including most importantly the difference between maintenance and prns and under what circumstances the prns are to be triggered using an action plan format that is not reflected in the computer generated alphabetically organized AVS.    Please see AVS for specific instructions unique to this office  visit that I personally wrote and verbalized to the the pt in detail and then reviewed with pt  by my nurse highlighting any changes in therapy/plan of care  recommended at today's visit.

## 2019-04-17 NOTE — Progress Notes (Signed)
Subjective:     Patient ID: Bob Elliott, male   DOB: 08/25/1941,     MRN: [id number]    Brief patient profile:  77 yobm never smoker with indolent onset doe 2016  x up the driveway to his house (probably 68ft) x mild incline progressively worse > eval by cards first 2017 ? Better p changed cardizem to amlopidine by cards (? Chronotropic incompetence)  but gradually worse since last eval there 10/2016 so referred to pulmonary clinic 08/12/2017 by Dr   Lysle Rubens    History of Present Illness  08/12/2017 1st [facility] office visit/ Caprina Wussow   Chief Complaint  Patient presents with  . Pulm Consult    Referred by Dr. Deforest Hoyles for DOE. States he becomes SOB while walking around, doing activities. Chest tightness.     In addition to indolent onset progressive doe also notes Noct smothering ? Almost strangling immediately if lie down on back/ better on either side  Also bad hb refractory to nexium take p supper plus multiple otcs rec Change nexium to Take 30-60 min before first meal of the day and Pepcid 20 mg at bedtime and your heartburn symptoms should gradually fade away, especially once you are off mavik  Stop mavik and start avapro(ibesartan)  150 mg daily  - just take one half daily if too strong  GERD diet  In 6 weeks, if  you are satisfied with your treatment plan,  let your doctor know and he/she can either refill your medications or you can return here when your prescription runs out.  If in any way you are not 100% satisfied in 6 weeks,  Schedule follow up with me with all your medications in hand    04/17/2019  f/u ov/Sherri Levenhagen re: doe  Chief Complaint  Patient presents with  . Shortness of Breath   Dyspnea:  Can walk back to house from mb which is slt incline /able to walk behind a self propelled mower x 7 min stops and rest x 4 min and resumes x 7 min/ ex bike x 5 min and then stops due to legs give out  Cough: tickle in throat, attributes to reflux " what do you do about it " A suck on  peppermint  Sleeping: fine on flat bed, one pillow  SABA use: none  02: none    No obvious day to day or daytime variability or assoc excess/ purulent sputum or mucus plugs or hemoptysis or cp or chest tightness, subjective wheeze or overt sinus or hb symptoms.   Sleeping as above flat without nocturnal  or early am exacerbation  of respiratory  c/o's or need for noct saba. Also denies any obvious fluctuation of symptoms with weather or environmental changes or other aggravating or alleviating factors except as outlined above   No unusual exposure hx or h/o childhood pna/ asthma or knowledge of premature birth.  Current Allergies, Complete Past Medical History, Past Surgical History, Family History, and Social History were reviewed in [employer] record.  ROS  The following are not active complaints unless bolded Hoarseness, sore throat, dysphagia/globus sensation , dental problems, itching, sneezing,  nasal congestion or discharge of excess mucus or purulent secretions, ear ache,   fever, chills, sweats, unintended wt loss or wt gain, classically pleuritic or exertional cp,  orthopnea pnd or arm/hand swelling  or leg swelling, presyncope, palpitations, abdominal pain, anorexia, nausea, vomiting, diarrhea  or change in bowel habits or change in bladder habits, change in  stools or change in urine, dysuria, hematuria,  rash, arthralgias, visual complaints, headache, numbness, weakness or ataxia or problems with walking or coordination,  change in mood or  memory.        Current Meds  Medication Sig  . amLODipine (NORVASC) 10 MG tablet Take 1 tablet (10 mg total) by mouth daily.  [name] doxazosin (CARDURA) 4 MG tablet Take 4 mg by mouth at bedtime.   [name] esomeprazole (NEXIUM) 40 MG capsule Take 30-60 min before first meal of the day  . fluticasone (FLONASE) 50 MCG/ACT nasal spray Place 2 sprays into both nostrils daily as needed for allergies or rhinitis.   [name] irbesartan (AVAPRO)  150 MG tablet Take 1 tablet (150 mg total) by mouth daily.  [name] loratadine (CLARITIN) 10 MG tablet Take 10 mg by mouth daily.  . potassium chloride (K-DUR) 10 MEQ tablet Take 2 tablets (20 mEq total) by mouth 3 (three) times daily.  [name] spironolactone (ALDACTONE) 25 MG tablet Take 0.5 tablets (12.5 mg total) by mouth daily.  . timolol (TIMOPTIC) 0.5 % ophthalmic solution Place 1 drop into both eyes daily.                      Objective:   Physical Exam  amb bm mild/mod hoarse with throat clearing / nad   04/17/2019          209   08/12/17 204 lb 12.8 oz (92.9 kg)  10/22/16 207 lb 12.8 oz (94.3 kg)  09/10/16 205 lb (93 kg)    Vital signs reviewed - Note on arrival 02 sats  96% on RA and bp 124/62      HEENT: nl dentition, turbinates bilaterally, and oropharynx. Nl external ear canals without cough reflex   NECK :  without JVD/Nodes/TM/ nl carotid upstrokes bilaterally   LUNGS: no acc muscle use,  Nl contour chest which is clear to A and P bilaterally without cough on insp or exp maneuvers   CV:  RRR  no s3 or murmur or increase in P2, and no edema   ABD:  Obese/ soft and nontender with nl inspiratory excursion in the supine position. No bruits or organomegaly appreciated, bowel sounds nl  MS:  Nl gait/ ext warm without deformities, calf tenderness, cyanosis or clubbing No obvious joint restrictions   SKIN: warm and dry without lesions    NEURO:  alert, approp, nl sensorium with  no motor or cerebellar deficits apparent.           I personally reviewed images and agree with radiology impression as follows:   Chest CTa 03/26/19 1. No significant change in caliber of the thoracic aorta, measuring approximately 4.1 x 4.0 cm in the tubular ascending thoracic aorta, previously 4.3 x 4.2 cm when measured similarly. The aortic root is normal in caliber. The sinuses of Valsalva are enlarged up to 4.3 cm. The distal aortic arch and descending thoracic aorta are normal in  caliber. Minimal atherosclerosis.  2. Diffuse bilateral bronchial wall thickening, consistent with nonspecific infectious or inflammatory bronchitis.    Assessment:

## 2019-04-17 NOTE — Assessment & Plan Note (Signed)
Trial off acei 08/12/2017 for unexplained sob/ noct smothering > latter resolved as of f/u 04/17/2019  Although even in retrospect it may not be clear the ACEi contributed to the pt's symptoms,  Pt improved off them and adding them back at this point or in the future would risk confusion in interpretation of non-specific respiratory symptoms to which this patient is prone  ie  Better not to muddy the waters here.   >>>   F/u per PCP on micardis 80 mg daily

## 2019-05-02 ENCOUNTER — Telehealth: Payer: Self-pay | Admitting: Cardiology

## 2019-05-02 NOTE — Telephone Encounter (Signed)
If he is having weakness/fatigue and lightheadedness, he can cut amlodipine to 5mg  daily and see how he feels. Ordinarily I would not be too concerned about BP in the 110-120 range but if he's having symptoms, let's enact the plan above and keep [place] posted how he does.

## 2019-05-02 NOTE — Telephone Encounter (Signed)
Pt c/o BP issue: STAT if pt c/o blurred vision, one-sided weakness or slurred speech  1. What are your last 5 BP readings?  06/04 112/66  06/06 108/64 06/08 123/86 06/09 121/80 06/11 120/79 06/12 106/78 06/13 116/85 06/14 119/80 06/15 117/77 06/17 101/74   2. Are you having any other symptoms (ex. Dizziness, headache, blurred vision, passed out)?  Weakness/ fatigue, lightheaded intermittently  3. What is your BP issue? Patient was worried that his BP is going too low. He wants to know if he should stop taking his medicine or take a smaller dose.  He had a telemedicine with Sharrell Ku on 05/13, and was told to reach out to the office if anything was going on.

## 2019-05-02 NOTE — Telephone Encounter (Signed)
Spoke with the patient, he was worried because the blood pressures written were before medication. He takes his medication when he eats breakfast. The most recent was 101/74, he took the blood pressure again 15 mins later at 117/70 and about 1 hour ago he took his medication and his pressure is 135/76. He stated he was worried since his blood pressure is lower in the morning. He stated he has some fatigue but does not believe it is medication caused.

## 2019-05-02 NOTE — Telephone Encounter (Signed)
Spoke with the pt and informed him that per Melina Copa PA-C he can cut his amlodipine to 5 mg po daily and see how he feels with that.  Informed the pt that per [facility], she is not too concerned with his BP in the 110-120 range, but being he has some symptoms, he can try cutting that med in 1/2 and keep [place] posted on how he does with that.  I advised the pt to either call back on Friday or next Monday to report how he does with the 1/2 dose of amlodipine.  Pt states he has a pill cutter and agrees with this plan.  Pt states if cutting it in 1/2 helps him out, when he calls to give [place] an update on his symptoms, he will then request for the 5 mg tabs of amlodipine to be sent to his pharmacy of choice.  Informed the pt that sounds acceptable and we will be on the listen out for his return call back. Pt verbalized understanding and agrees with this plan.  Pt was very gracious for the prompt follow-up.  Will route this communication to 64 CMA as an FYI, incase he returns the call back to her.

## 2019-05-17 DIAGNOSIS — H40053 Ocular hypertension, bilateral: Secondary | ICD-10-CM | POA: Diagnosis not present

## 2019-05-17 DIAGNOSIS — H40013 Open angle with borderline findings, low risk, bilateral: Secondary | ICD-10-CM | POA: Diagnosis not present

## 2019-05-17 DIAGNOSIS — H2512 Age-related nuclear cataract, left eye: Secondary | ICD-10-CM | POA: Diagnosis not present

## 2019-05-31 DIAGNOSIS — R21 Rash and other nonspecific skin eruption: Secondary | ICD-10-CM | POA: Diagnosis not present

## 2019-05-31 DIAGNOSIS — L0390 Cellulitis, unspecified: Secondary | ICD-10-CM | POA: Diagnosis not present

## 2019-07-20 DIAGNOSIS — Z23 Encounter for immunization: Secondary | ICD-10-CM | POA: Diagnosis not present

## 2019-07-20 DIAGNOSIS — E876 Hypokalemia: Secondary | ICD-10-CM | POA: Diagnosis not present

## 2019-07-20 DIAGNOSIS — K219 Gastro-esophageal reflux disease without esophagitis: Secondary | ICD-10-CM | POA: Diagnosis not present

## 2019-07-20 DIAGNOSIS — N182 Chronic kidney disease, stage 2 (mild): Secondary | ICD-10-CM | POA: Diagnosis not present

## 2019-07-20 DIAGNOSIS — N400 Enlarged prostate without lower urinary tract symptoms: Secondary | ICD-10-CM | POA: Diagnosis not present

## 2019-07-20 DIAGNOSIS — I10 Essential (primary) hypertension: Secondary | ICD-10-CM | POA: Diagnosis not present

## 2019-07-20 DIAGNOSIS — I712 Thoracic aortic aneurysm, without rupture: Secondary | ICD-10-CM | POA: Diagnosis not present

## 2019-07-27 DIAGNOSIS — M67912 Unspecified disorder of synovium and tendon, left shoulder: Secondary | ICD-10-CM | POA: Diagnosis not present

## 2019-07-27 DIAGNOSIS — M24812 Other specific joint derangements of left shoulder, not elsewhere classified: Secondary | ICD-10-CM | POA: Diagnosis not present

## 2019-08-06 ENCOUNTER — Other Ambulatory Visit: Payer: Self-pay | Admitting: Cardiology

## 2019-08-06 MED ORDER — IRBESARTAN 150 MG PO TABS: 150.0000 mg | ORAL_TABLET | Freq: Every day | ORAL | 0 refills | Status: DC

## 2019-08-06 NOTE — Telephone Encounter (Signed)
Pt's medication was sent to pt's pharmacy as requested. Confirmation received.  °

## 2019-08-07 DIAGNOSIS — M7542 Impingement syndrome of left shoulder: Secondary | ICD-10-CM | POA: Diagnosis not present

## 2019-08-09 DIAGNOSIS — M7542 Impingement syndrome of left shoulder: Secondary | ICD-10-CM | POA: Diagnosis not present

## 2019-08-14 DIAGNOSIS — M7542 Impingement syndrome of left shoulder: Secondary | ICD-10-CM | POA: Diagnosis not present

## 2019-08-21 DIAGNOSIS — H43813 Vitreous degeneration, bilateral: Secondary | ICD-10-CM | POA: Diagnosis not present

## 2019-08-21 DIAGNOSIS — H2512 Age-related nuclear cataract, left eye: Secondary | ICD-10-CM | POA: Diagnosis not present

## 2019-08-27 DIAGNOSIS — D23111 Other benign neoplasm of skin of right upper eyelid, including canthus: Secondary | ICD-10-CM | POA: Diagnosis not present

## 2019-09-19 ENCOUNTER — Other Ambulatory Visit: Payer: Self-pay

## 2019-09-19 ENCOUNTER — Observation Stay (HOSPITAL_COMMUNITY)
Admission: EM | Admit: 2019-09-19 | Discharge: 2019-09-20 | Disposition: A | Discharge status: Home / Self Care | Payer: Medicare Other | Attending: Internal Medicine | Admitting: Internal Medicine

## 2019-09-19 ENCOUNTER — Encounter (HOSPITAL_COMMUNITY): Payer: Self-pay

## 2019-09-19 ENCOUNTER — Emergency Department (HOSPITAL_COMMUNITY): Payer: Medicare Other

## 2019-09-19 DIAGNOSIS — R29818 Other symptoms and signs involving the nervous system: Secondary | ICD-10-CM | POA: Diagnosis not present

## 2019-09-19 DIAGNOSIS — Z886 Allergy status to analgesic agent status: Secondary | ICD-10-CM | POA: Insufficient documentation

## 2019-09-19 DIAGNOSIS — Z885 Allergy status to narcotic agent status: Secondary | ICD-10-CM | POA: Insufficient documentation

## 2019-09-19 DIAGNOSIS — Z79899 Other long term (current) drug therapy: Secondary | ICD-10-CM | POA: Diagnosis not present

## 2019-09-19 DIAGNOSIS — R001 Bradycardia, unspecified: Secondary | ICD-10-CM | POA: Insufficient documentation

## 2019-09-19 DIAGNOSIS — G4733 Obstructive sleep apnea (adult) (pediatric): Secondary | ICD-10-CM | POA: Insufficient documentation

## 2019-09-19 DIAGNOSIS — M1990 Unspecified osteoarthritis, unspecified site: Secondary | ICD-10-CM | POA: Insufficient documentation

## 2019-09-19 DIAGNOSIS — G453 Amaurosis fugax: Principal | ICD-10-CM

## 2019-09-19 DIAGNOSIS — Z20828 Contact with and (suspected) exposure to other viral communicable diseases: Secondary | ICD-10-CM | POA: Insufficient documentation

## 2019-09-19 DIAGNOSIS — I739 Peripheral vascular disease, unspecified: Secondary | ICD-10-CM | POA: Diagnosis not present

## 2019-09-19 DIAGNOSIS — I672 Cerebral atherosclerosis: Secondary | ICD-10-CM | POA: Insufficient documentation

## 2019-09-19 DIAGNOSIS — Z800 Family history of malignant neoplasm of digestive organs: Secondary | ICD-10-CM | POA: Insufficient documentation

## 2019-09-19 DIAGNOSIS — E785 Hyperlipidemia, unspecified: Secondary | ICD-10-CM | POA: Diagnosis not present

## 2019-09-19 DIAGNOSIS — N182 Chronic kidney disease, stage 2 (mild): Secondary | ICD-10-CM | POA: Diagnosis not present

## 2019-09-19 DIAGNOSIS — I712 Thoracic aortic aneurysm, without rupture: Secondary | ICD-10-CM | POA: Insufficient documentation

## 2019-09-19 DIAGNOSIS — H409 Unspecified glaucoma: Secondary | ICD-10-CM | POA: Diagnosis not present

## 2019-09-19 DIAGNOSIS — E1122 Type 2 diabetes mellitus with diabetic chronic kidney disease: Secondary | ICD-10-CM | POA: Diagnosis not present

## 2019-09-19 DIAGNOSIS — K222 Esophageal obstruction: Secondary | ICD-10-CM | POA: Diagnosis not present

## 2019-09-19 DIAGNOSIS — I440 Atrioventricular block, first degree: Secondary | ICD-10-CM | POA: Insufficient documentation

## 2019-09-19 DIAGNOSIS — K219 Gastro-esophageal reflux disease without esophagitis: Secondary | ICD-10-CM | POA: Diagnosis not present

## 2019-09-19 DIAGNOSIS — I6330 Cerebral infarction due to thrombosis of unspecified cerebral artery: Secondary | ICD-10-CM | POA: Diagnosis not present

## 2019-09-19 DIAGNOSIS — R519 Headache, unspecified: Secondary | ICD-10-CM | POA: Insufficient documentation

## 2019-09-19 DIAGNOSIS — N400 Enlarged prostate without lower urinary tract symptoms: Secondary | ICD-10-CM | POA: Diagnosis not present

## 2019-09-19 DIAGNOSIS — I351 Nonrheumatic aortic (valve) insufficiency: Secondary | ICD-10-CM | POA: Diagnosis not present

## 2019-09-19 DIAGNOSIS — I129 Hypertensive chronic kidney disease with stage 1 through stage 4 chronic kidney disease, or unspecified chronic kidney disease: Secondary | ICD-10-CM | POA: Insufficient documentation

## 2019-09-19 DIAGNOSIS — Z833 Family history of diabetes mellitus: Secondary | ICD-10-CM | POA: Insufficient documentation

## 2019-09-19 DIAGNOSIS — I10 Essential (primary) hypertension: Secondary | ICD-10-CM | POA: Diagnosis not present

## 2019-09-19 DIAGNOSIS — Z7902 Long term (current) use of antithrombotics/antiplatelets: Secondary | ICD-10-CM | POA: Insufficient documentation

## 2019-09-19 DIAGNOSIS — Z9841 Cataract extraction status, right eye: Secondary | ICD-10-CM | POA: Insufficient documentation

## 2019-09-19 DIAGNOSIS — Z806 Family history of leukemia: Secondary | ICD-10-CM | POA: Insufficient documentation

## 2019-09-19 DIAGNOSIS — Z8249 Family history of ischemic heart disease and other diseases of the circulatory system: Secondary | ICD-10-CM | POA: Insufficient documentation

## 2019-09-19 DIAGNOSIS — Z91013 Allergy to seafood: Secondary | ICD-10-CM | POA: Insufficient documentation

## 2019-09-19 DIAGNOSIS — Z823 Family history of stroke: Secondary | ICD-10-CM | POA: Insufficient documentation

## 2019-09-19 LAB — COMPREHENSIVE METABOLIC PANEL
ALT: 16 U/L (ref 0–44)
AST: 16 U/L (ref 15–41)
Albumin: 3.8 g/dL (ref 3.5–5.0)
Alkaline Phosphatase: 57 U/L (ref 38–126)
Anion gap: 8 (ref 5–15)
BUN: 14 mg/dL (ref 8–23)
CO2: 25 mmol/L (ref 22–32)
Calcium: 9.2 mg/dL (ref 8.9–10.3)
Chloride: 106 mmol/L (ref 98–111)
Creatinine, Ser: 1.34 mg/dL — ABNORMAL HIGH (ref 0.61–1.24)
GFR calc Af Amer: 58 mL/min — ABNORMAL LOW (ref >60)
GFR calc non Af Amer: 50 mL/min — ABNORMAL LOW (ref >60)
Glucose, Bld: 112 mg/dL — ABNORMAL HIGH (ref 70–99)
Potassium: 4 mmol/L (ref 3.5–5.1)
Sodium: 139 mmol/L (ref 135–145)
Total Bilirubin: 0.9 mg/dL (ref 0.3–1.2)
Total Protein: 6.7 g/dL (ref 6.5–8.1)

## 2019-09-19 LAB — CBC
HCT: 45.4 % (ref 39.0–52.0)
Hemoglobin: 15.2 g/dL (ref 13.0–17.0)
MCH: 30.8 pg (ref 26.0–34.0)
MCHC: 33.5 g/dL (ref 30.0–36.0)
MCV: 91.9 fL (ref 80.0–100.0)
Platelets: 196 10*3/uL (ref 150–400)
RBC: 4.94 MIL/uL (ref 4.22–5.81)
RDW: 13.2 % (ref 11.5–15.5)
WBC: 6.3 10*3/uL (ref 4.0–10.5)
nRBC: 0 % (ref 0.0–0.2)

## 2019-09-19 LAB — CBG MONITORING, ED: Glucose-Capillary: 105 mg/dL — ABNORMAL HIGH (ref 70–99)

## 2019-09-19 LAB — DIFFERENTIAL
Abs Immature Granulocytes: 0.04 10*3/uL (ref 0.00–0.07)
Basophils Absolute: 0.1 10*3/uL (ref 0.0–0.1)
Basophils Relative: 1 %
Eosinophils Absolute: 0.2 10*3/uL (ref 0.0–0.5)
Eosinophils Relative: 3 %
Immature Granulocytes: 1 %
Lymphocytes Relative: 21 %
Lymphs Abs: 1.3 10*3/uL (ref 0.7–4.0)
Monocytes Absolute: 0.7 10*3/uL (ref 0.1–1.0)
Monocytes Relative: 12 %
Neutro Abs: 4 10*3/uL (ref 1.7–7.7)
Neutrophils Relative %: 62 %

## 2019-09-19 LAB — APTT: aPTT: 27 seconds (ref 24–36)

## 2019-09-19 LAB — I-STAT CHEM 8, ED
BUN: 15 mg/dL (ref 8–23)
Calcium, Ion: 1.23 mmol/L (ref 1.15–1.40)
Chloride: 102 mmol/L (ref 98–111)
Creatinine, Ser: 1.4 mg/dL — ABNORMAL HIGH (ref 0.61–1.24)
Glucose, Bld: 108 mg/dL — ABNORMAL HIGH (ref 70–99)
HCT: 44 % (ref 39.0–52.0)
Hemoglobin: 15 g/dL (ref 13.0–17.0)
Potassium: 4 mmol/L (ref 3.5–5.1)
Sodium: 141 mmol/L (ref 135–145)
TCO2: 26 mmol/L (ref 22–32)

## 2019-09-19 LAB — PROTIME-INR
INR: 1 (ref 0.8–1.2)
Prothrombin Time: 13 seconds (ref 11.4–15.2)

## 2019-09-19 MED ORDER — SODIUM CHLORIDE 0.9% FLUSH
3.0000 mL | Freq: Once | INTRAVENOUS | Status: AC
  Administered 2019-09-19: 3 mL via INTRAVENOUS

## 2019-09-19 NOTE — ED Triage Notes (Signed)
Pt with visual loss in the right eye with headache around 1940 this evening. Vision has returned at triage. EDP to screen at bridge. Labs and ekg collected.

## 2019-09-19 NOTE — ED Provider Notes (Signed)
[facility] EMERGENCY DEPARTMENT Provider Note   CSN: [place]:[id number] Arrival date & time: 09/19/19  2029  An emergency department physician performed an initial assessment on this suspected stroke patient at 2045.  History   Chief Complaint Chief Complaint  Patient presents with   Loss of Vision   Headache    HPI Bob Elliott is a 78 y.o. male.     HPI Patient was watching television when suddenly he noted that the vision in the right eye changed.  The entire top half of the television just went black in the lower half had a diffusely blurry colored image.  He reports that this started to improve after about 15 minutes and resolved completely.  At the time of my examination, patient reports his vision is back to normal.  The event occurred at 19: 40.  Patient denies he had any other associated weakness numbness or tingling.  He did note that he has been having a vague right sided headache on the temple and behind the eye for several weeks.  He reports is not a really severe headache but has been uncomfortable.  He rates it at about a 2 as we speak.  He has not had fevers or chills.  He has not had nasal congestion cough or sore throat. Past Medical History:  Diagnosis Date   Arthritis    BPH (benign prostatic hyperplasia)    CKD (chronic kidney disease), stage II    a. CKD II-III by labs.   Dizziness 10/21/2016   Esophageal dysmotility    Essential hypertension    Trial off acei 08/12/2017 for unexplained sob/ noct smothering    GERD (gastroesophageal reflux disease)    GI bleed    Glaucoma    BOTH EYES   Hyperlipidemia    Hypertension    Inguinal hernia without mention of obstruction or gangrene, unilateral or unspecified, (not specified as recurrent)-right s/p repair with mesh 05/21/2014   Schatzki's ring    Sinus bradycardia    a. prompting dc of Diltiazem (?chronotropic incompetence) in 2017.   Sleep apnea    "mild" no cpap   Thoracic  aortic aneurysm (TAA) [facility])     Patient Active Problem List   Diagnosis Date Noted   Vision loss 09/19/2019   Upper airway cough syndrome 04/17/2019   Thoracic aortic aneurysm ([place]) 07/21/2018   DOE (dyspnea on exertion) 10/21/2016   Dizziness 10/21/2016   Sinus bradycardia 10/21/2016   Sleep apnea    Schatzki's ring    Essential hypertension    Hyperlipidemia    Esophageal dysmotility    GERD (gastroesophageal reflux disease)    Inguinal hernia without mention of obstruction or gangrene, unilateral or unspecified, (not specified as recurrent)-right s/p repair with mesh 05/21/2014    Past Surgical History:  Procedure Laterality Date   COLONOSCOPY WITH PROPOFOL N/A 05/11/2016   Procedure: COLONOSCOPY WITH PROPOFOL;  Surgeon: Garlan Fair, MD;  Location: [facility];  Service: Endoscopy;  Laterality: N/A;   EYE SURGERY     CATARACT R  EYE   HERNIA REPAIR     UMBILICAL   INGUINAL HERNIA REPAIR Right 06/18/2014   Procedure: RIGHT INGUINAL HERNIA REPAIR;  Surgeon: Odis Hollingshead, MD;  Location: [facility];  Service: General;  Laterality: Right;   INSERTION OF MESH Right 06/18/2014   Procedure: INSERTION OF MESH;  Surgeon: Odis Hollingshead, MD;  Location: [facility];  Service: General;  Laterality: Right;   [place]  cervical disc surgery(limited ROM)- retained hardware   TONSILLECTOMY          Home Medications    Prior to Admission medications   Medication Sig Start Date End Date Taking? Authorizing Provider  amLODipine (NORVASC) 10 MG tablet Take 1 tablet (10 mg total) by mouth daily. 11/02/18   Dorothy Spark, MD  doxazosin (CARDURA) 4 MG tablet Take 4 mg by mouth at bedtime.  02/12/14   [provider]  esomeprazole (NEXIUM) 40 MG capsule Take 30-60 min before first meal of the day 08/12/17   Tanda Rockers, MD  fluticasone [facility]) 50 MCG/ACT nasal spray Place 2 sprays into both nostrils daily as needed for allergies or rhinitis.   03/28/14   [provider]  irbesartan (AVAPRO) 150 MG tablet Take 1 tablet (150 mg total) by mouth daily. Pt needed a 10 day supply until mail order arrives. Thank you 08/06/19   Dorothy Spark, MD  loratadine (CLARITIN) 10 MG tablet Take 10 mg by mouth daily.    [provider]  potassium chloride (K-DUR) 10 MEQ tablet Take 2 tablets (20 mEq total) by mouth 3 (three) times daily. 11/02/18   Dorothy Spark, MD  spironolactone (ALDACTONE) 25 MG tablet Take 0.5 tablets (12.5 mg total) by mouth daily. 11/02/18   Dorothy Spark, MD  timolol (TIMOPTIC) 0.5 % ophthalmic solution Place 1 drop into both eyes daily.  11/25/17   [provider]    Family History Family History  Problem Relation Age of Onset   Heart disease Mother    Stroke Father    Cancer Brother        colon   Diabetes Sister    Leukemia Maternal Grandmother     Social History Social History   Tobacco Use   Smoking status: Never Smoker   Smokeless tobacco: Never Used  Substance Use Topics   Alcohol use: Yes    Comment: none since 80's   Drug use: No     Allergies   Aspirin, Codeine, and Shellfish allergy   Review of Systems Review of Systems 10 Systems reviewed and are negative for acute change except as noted in the HPI.   Physical Exam Updated Vital Signs BP 132/72    Pulse 62    Temp 97.8 F (36.6 C) (Oral)    Resp (!) 21    Wt 97.1 kg    SpO2 96%    BMI 32.55 kg/m   Physical Exam Constitutional:      Comments: Alert and appropriate.  No confusion.  No respiratory distress.  Nontoxic.  HENT:     Head: Normocephalic and atraumatic.     Nose: Nose normal.     Mouth/Throat:     Mouth: Mucous membranes are moist.     Pharynx: Oropharynx is clear.  Eyes:     Extraocular Movements: Extraocular movements intact.     Conjunctiva/sclera: Conjunctivae normal.     Pupils: Pupils are equal, round, and reactive to light.     Comments: Funduscopic exam somewhat  limited due to patient not being dilated.  Right funduscopic exam I can appreciate that the retina has normal color and although cannot determine details of vasculature vessels present to the periphery to the best of my ability to examine.  No dark areas to suggest retinal detachment with somewhat limited visualization.  Neck:     Musculoskeletal: Neck supple.  Cardiovascular:     Rate and Rhythm: Normal rate and regular rhythm.  Pulmonary:     Effort: Pulmonary effort is normal.     Breath sounds: Normal breath sounds.  Abdominal:     General: There is no distension.     Palpations: Abdomen is soft.     Tenderness: There is no abdominal tenderness. There is no guarding.  Musculoskeletal: Normal range of motion.  Skin:    General: Skin is warm and dry.  Neurological:     General: No focal deficit present.     Mental Status: He is oriented to person, place, and time.     Cranial Nerves: No cranial nerve deficit.     Motor: No weakness.     Coordination: Coordination normal.     Comments: Finger nose intact bilaterally  Psychiatric:        Mood and Affect: Mood normal.      ED Treatments / Results  Labs (all labs ordered are listed, but only abnormal results are displayed) Labs Reviewed  COMPREHENSIVE METABOLIC PANEL - Abnormal; Notable for the following components:      Result Value   Glucose, Bld 112 (*)    Creatinine, Ser 1.34 (*)    GFR calc non Af Amer 50 (*)    GFR calc Af Amer 58 (*)    All other components within normal limits  I-STAT CHEM 8, ED - Abnormal; Notable for the following components:   Creatinine, Ser 1.40 (*)    Glucose, Bld 108 (*)    All other components within normal limits  CBG MONITORING, ED - Abnormal; Notable for the following components:   Glucose-Capillary 105 (*)    All other components within normal limits  SARS CORONAVIRUS 2 (TAT 6-24 HRS)  PROTIME-INR  APTT  CBC  DIFFERENTIAL    EKG EKG Interpretation  Date/Time:  Wednesday  September 19 2019 20:38:21 EST Ventricular Rate:  78 PR Interval:  222 QRS Duration: 82 QT Interval:  386 QTC Calculation: 440 R Axis:   89 Text Interpretation: Sinus rhythm with 1st degree A-V block no ischemic chnages, no sig change from old Confirmed by Charlesetta Shanks [id number]) on 09/20/2019 12:18:33 AM   Radiology Ct Head Code Stroke Wo Contrast  Result Date: 09/19/2019 CLINICAL DATA:  Code stroke.  Vision loss. EXAM: CT HEAD WITHOUT CONTRAST TECHNIQUE: Contiguous axial images were obtained from the base of the skull through the vertex without intravenous contrast. COMPARISON:  Head MRI 02/27/2004 FINDINGS: Brain: An approximately 1.7 cm region of hyperattenuation in the region of the right lentiform nucleus and internal capsule is without edema or mass effect and is most compatible with calcification related to the developmental venous anomaly in this region demonstrated on prior MRI. There is no evidence of acute infarct, intracranial hemorrhage, mass, midline shift, or extra-axial fluid collection. The ventricles and sulci are within normal limits for age. Vascular: Calcified atherosclerosis at the skull base. 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 Skull: No fracture or focal osseous lesion. Sinuses/Orbits: Visualized paranasal sinuses and mastoid air cells are clear. Right cataract extraction is noted. Other: None. ASPECTS [facility] Stroke Program Early CT Score) - Ganglionic level infarction (caudate, lentiform nuclei, internal capsule, insula, M1-M3 cortex): 7 - Supraganglionic infarction (M4-M6 cortex): 3 Total score (0-10 with 10 being normal): 10 IMPRESSION: 1. No evidence of acute intracranial abnormality. 2. ASPECTS is 10. 3. 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 These results were communicated to Dr. Cheral Marker at 9:17 pm on 09/19/2019 by text page via the [facility] messaging system. Electronically Signed  By: Logan Bores M.D.   On:  09/19/2019 21:18    Procedures Procedures (including critical care time)  Medications Ordered in ED Medications  sodium chloride flush (NS) 0.9 % injection 3 mL (3 mLs Intravenous Given 09/19/19 2346)     Initial Impression / Assessment and Plan / ED Course  I have reviewed the triage vital signs and the nursing notes.  Pertinent labs & imaging results that were available during my care of the patient were reviewed by me and considered in my medical decision making (see chart for details).        Consult: Patient was seen by Dr. Cheral Marker for code stroke. Consult: Dr. Marlowe Sax for admission.  Patient presents as outlined above.  At time of evaluation visual symptoms resolved.  Physical exam for normal neurologic exam.  No evident ocular trauma or retinal detachment by exam.  We will proceed with MRI/MRA per recommendations of neurology and admission for continued stroke work-up.  Final Clinical Impressions(s) / ED Diagnoses   Final diagnoses:  Amaurosis fugax of right eye    ED Discharge Orders    None       Charlesetta Shanks, MD 09/20/19 0020

## 2019-09-19 NOTE — Consult Note (Signed)
Referring Physician: Dr. Johnney Killian    Chief Complaint: Acute onset of right monocular vision impairment.   HPI: Bob Elliott is an 78 y.o. male with no prior history of stroke who presents to the ED after transient vision loss affecting his right eye. Symptoms began while watching TV at [place], with sudden onset of complete blacking out of vision in his superior hemifield, with blurring of vision in his inferior right hemifield. His left eye was unaffected. He states that after about 5-7 minutes, his vision started to come back gradually. He states that his right monocular vision is now back to normal.   He has a history of cataract extraction OD as well as glaucoma OU.   Denies any numbness or weakness of limbs or face. No CP, cough or SOB. No confusion or difficulty speaking or understanding language. No dysarthria. Has had chronic difficulty with swallowing.   He has had intermittent headache involving the right side of his head for several weeks.   LSN: [id number] tPA Given: No: Symptoms resolved  Past Medical History:  Diagnosis Date  . Arthritis   . BPH (benign prostatic hyperplasia)   . CKD (chronic kidney disease), stage II    a. CKD II-III by labs.  . Dizziness 10/21/2016  . Esophageal dysmotility   . Essential hypertension    Trial off acei 08/12/2017 for unexplained sob/ noct smothering   . GERD (gastroesophageal reflux disease)   . GI bleed   . Glaucoma    BOTH EYES  . Hyperlipidemia   . Hypertension   . Inguinal hernia without mention of obstruction or gangrene, unilateral or unspecified, (not specified as recurrent)-right s/p repair with mesh 05/21/2014  . Schatzki's ring   . Sinus bradycardia    a. prompting dc of Diltiazem (?chronotropic incompetence) in 2017.  [name] Sleep apnea    "mild" no cpap  . Thoracic aortic aneurysm (TAA) ([place])     Past Surgical History:  Procedure Laterality Date  . COLONOSCOPY WITH PROPOFOL N/A 05/11/2016   Procedure: COLONOSCOPY WITH PROPOFOL;   Surgeon: Garlan Fair, MD;  Location: [facility];  Service: Endoscopy;  Laterality: N/A;  . EYE SURGERY     CATARACT R  EYE  . HERNIA REPAIR     UMBILICAL  . INGUINAL HERNIA REPAIR Right 06/18/2014   Procedure: RIGHT INGUINAL HERNIA REPAIR;  Surgeon: Odis Hollingshead, MD;  Location: [place];  Service: General;  Laterality: Right;  . INSERTION OF MESH Right 06/18/2014   Procedure: INSERTION OF MESH;  Surgeon: Odis Hollingshead, MD;  Location: [place];  Service: General;  Laterality: Right;  . NECK SURGERY  2000   cervical disc surgery(limited ROM)- retained hardware  . TONSILLECTOMY      Family History  Problem Relation Age of Onset  . Heart disease Mother   . Stroke Father   . Cancer Brother        colon  . Diabetes Sister   . Leukemia Maternal Grandmother    Social History:  reports that he has never smoked. He has never used smokeless tobacco. He reports current alcohol use. He reports that he does not use drugs.  Allergies:  Allergies  Allergen Reactions  . Aspirin Hives, Itching and Nausea Only  . Codeine Hives, Itching and Nausea Only  . Shellfish Allergy Hives    Medications:  No current facility-administered medications on file prior to encounter.    Current Outpatient Medications on File Prior to Encounter  Medication Sig Dispense Refill  .  amLODipine (NORVASC) 10 MG tablet Take 1 tablet (10 mg total) by mouth daily. 90 tablet 3  . doxazosin (CARDURA) 4 MG tablet Take 4 mg by mouth at bedtime.     [name] esomeprazole (NEXIUM) 40 MG capsule Take 30-60 min before first meal of the day    . fluticasone (FLONASE) 50 MCG/ACT nasal spray Place 2 sprays into both nostrils daily as needed for allergies or rhinitis.     [name] irbesartan (AVAPRO) 150 MG tablet Take 1 tablet (150 mg total) by mouth daily. Pt needed a 10 day supply until mail order arrives. Thank you 10 tablet 0  . loratadine (CLARITIN) 10 MG tablet Take 10 mg by mouth daily.    . potassium chloride (K-DUR) 10 MEQ  tablet Take 2 tablets (20 mEq total) by mouth 3 (three) times daily. 540 tablet 3  . spironolactone (ALDACTONE) 25 MG tablet Take 0.5 tablets (12.5 mg total) by mouth daily. 45 tablet 3  . timolol (TIMOPTIC) 0.5 % ophthalmic solution Place 1 drop into both eyes daily.   4     ROS: As per HPI. Comprehensive ROS otherwise negative.   Physical Examination: Blood pressure (!) 143/75, pulse 69, temperature 97.8 F (36.6 C), temperature source Oral, resp. rate (!) 24, weight 97.1 kg, SpO2 98 %.  HEENT: [place]/AT Lungs: Respirations unlabored Ext: No edema  Neurologic Examination: Mental Status: Alert, fully oriented, thought content appropriate.  Speech fluent without evidence of aphasia.  Able to follow all commands without difficulty. Cranial Nerves: II:  Visual fields intact in all 4 quadrants bilaterally. No extinction to DSS. PERRL. III,IV, VI: No ptosis. EOMI without nystagmus.   V,VII: Smile symmetric. Facial temp sensation normal bilaterally VIII: hearing intact to voice IX,X: Palate rises symmetrically XI: Symmetric XII: midline tongue extension  Motor: Right : Upper extremity   5/5    Left:     Upper extremity   5/5  Lower extremity   5/5     Lower extremity   5/5 No pronator drift Sensory: Temp and light touch intact throughout, bilaterally Deep Tendon Reflexes:  2+ bilateral upper extremities and patellae Cerebellar: No ataxia with FNF bilaterally  Gait: Deferred  Results for orders placed or performed during the hospital encounter of 09/19/19 (from the past 48 hour(s))  Protime-INR     Status: None   Collection Time: 09/19/19  8:46 PM  Result Value Ref Range   Prothrombin Time 13.0 11.4 - 15.2 seconds   INR 1.0 0.8 - 1.2    Comment: (NOTE) INR goal varies based on device and disease states. Performed at [facility], [place] [address], [place], [place] [address]   APTT     Status: None   Collection Time: 09/19/19  8:46 PM  Result Value Ref Range   aPTT 27 24 -  36 seconds    Comment: Performed at [facility] [address], [place], [place] [address]  CBC     Status: None   Collection Time: 09/19/19  8:46 PM  Result Value Ref Range   WBC 6.3 4.0 - 10.5 K/uL   RBC 4.94 4.22 - 5.81 MIL/uL   Hemoglobin 15.2 13.0 - 17.0 g/dL   HCT 45.4 39.0 - 52.0 %   MCV 91.9 80.0 - 100.0 fL   MCH 30.8 26.0 - 34.0 pg   MCHC 33.5 30.0 - 36.0 g/dL   RDW 13.2 11.5 - 15.5 %   Platelets 196 150 - 400 K/uL   nRBC  0.0 0.0 - 0.2 %    Comment: Performed at [facility], [place] [address], [place], [place] [address]  Differential     Status: None   Collection Time: 09/19/19  8:46 PM  Result Value Ref Range   Neutrophils Relative % 62 %   Neutro Abs 4.0 1.7 - 7.7 K/uL   Lymphocytes Relative 21 %   Lymphs Abs 1.3 0.7 - 4.0 K/uL   Monocytes Relative 12 %   Monocytes Absolute 0.7 0.1 - 1.0 K/uL   Eosinophils Relative 3 %   Eosinophils Absolute 0.2 0.0 - 0.5 K/uL   Basophils Relative 1 %   Basophils Absolute 0.1 0.0 - 0.1 K/uL   Immature Granulocytes 1 %   Abs Immature Granulocytes 0.04 0.00 - 0.07 K/uL    Comment: Performed at [facility] [address], [place], [place] [address]  Comprehensive metabolic panel     Status: Abnormal   Collection Time: 09/19/19  8:46 PM  Result Value Ref Range   Sodium 139 135 - 145 mmol/L   Potassium 4.0 3.5 - 5.1 mmol/L   Chloride 106 98 - 111 mmol/L   CO2 25 22 - 32 mmol/L   Glucose, Bld 112 (H) 70 - 99 mg/dL   BUN 14 8 - 23 mg/dL   Creatinine, Ser 1.34 (H) 0.61 - 1.24 mg/dL   Calcium 9.2 8.9 - 10.3 mg/dL   Total Protein 6.7 6.5 - 8.1 g/dL   Albumin 3.8 3.5 - 5.0 g/dL   AST 16 15 - 41 U/L   ALT 16 0 - 44 U/L   Alkaline Phosphatase 57 38 - 126 U/L   Total Bilirubin 0.9 0.3 - 1.2 mg/dL   GFR calc non Af Amer 50 (L) >60 mL/min   GFR calc Af Amer 58 (L) >60 mL/min   Anion gap 8 5 - 15    Comment: Performed at [facility] [address] [place], [place] [address]  CBG monitoring, ED     Status:  Abnormal   Collection Time: 09/19/19  8:50 PM  Result Value Ref Range   Glucose-Capillary 105 (H) 70 - 99 mg/dL  I-stat chem 8, ED     Status: Abnormal   Collection Time: 09/19/19  9:01 PM  Result Value Ref Range   Sodium 141 135 - 145 mmol/L   Potassium 4.0 3.5 - 5.1 mmol/L   Chloride 102 98 - 111 mmol/L   BUN 15 8 - 23 mg/dL   Creatinine, Ser 1.40 (H) 0.61 - 1.24 mg/dL   Glucose, Bld 108 (H) 70 - 99 mg/dL   Calcium, Ion 1.23 1.15 - 1.40 mmol/L   TCO2 26 22 - 32 mmol/L   Hemoglobin 15.0 13.0 - 17.0 g/dL   HCT 44.0 39.0 - 52.0 %   Ct Head Code Stroke Wo Contrast  Result Date: 09/19/2019 CLINICAL DATA:  Code stroke.  Vision loss. EXAM: CT HEAD WITHOUT CONTRAST TECHNIQUE: Contiguous axial images were obtained from the base of the skull through the vertex without intravenous contrast. COMPARISON:  Head MRI 02/27/2004 FINDINGS: Brain: An approximately 1.7 cm region of hyperattenuation in the region of the right lentiform nucleus and internal capsule is without edema or mass effect and is most compatible with calcification related to the developmental venous anomaly in this region demonstrated on prior MRI. There is no evidence of acute infarct, intracranial hemorrhage, mass, midline shift, or extra-axial fluid collection. The ventricles and sulci are within normal limits for  age. Vascular: Calcified atherosclerosis at the skull base. 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 Skull: No fracture or focal osseous lesion. Sinuses/Orbits: Visualized paranasal sinuses and mastoid air cells are clear. Right cataract extraction is noted. Other: None. ASPECTS [facility] Stroke Program Early CT Score) - Ganglionic level infarction (caudate, lentiform nuclei, internal capsule, insula, M1-M3 cortex): 7 - Supraganglionic infarction (M4-M6 cortex): 3 Total score (0-10 with 10 being normal): 10 IMPRESSION: 1. No evidence of acute intracranial abnormality. 2. ASPECTS is 10. 3. 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 These results were communicated to Dr. Cheral Marker at 9:17 pm on 09/19/2019 by text page via the [facility] messaging system. Electronically Signed   By: Logan Bores M.D.   On: 09/19/2019 21:18    Assessment: 78 y.o. male presenting with amaurosis fugax on the right. 1. Description of transient altitudinal vision loss is most consistent with a BRAO on the right. Most likely etiology is microembolic from right ICA plaque or cardiac source.  2. Stroke Risk Factors - HLD, HTN and sleep apnea  Plan: 1. HgbA1c, fasting lipid panel 2. MRI, MRA of the brain without contrast 3. PT consult, OT consult, Speech consult 4. Echocardiogram 5. Carotid dopplers 6. Prophylactic therapy- Start Plavix 75 mg po qd. The patient states that he cannot tolerate ASA due to GI side effect 7. Risk factor modification 8. Telemetry monitoring 9. Frequent neuro checks 10. Possible initiation of a statin pending results of stroke work up.  11. BP management.    @Electronically  signed: Dr. Kerney Elbe 09/19/2019, 9:36 PM

## 2019-09-20 ENCOUNTER — Observation Stay (HOSPITAL_COMMUNITY): Payer: Medicare Other

## 2019-09-20 ENCOUNTER — Observation Stay (HOSPITAL_BASED_OUTPATIENT_CLINIC_OR_DEPARTMENT_OTHER): Payer: Medicare Other

## 2019-09-20 DIAGNOSIS — G453 Amaurosis fugax: Principal | ICD-10-CM

## 2019-09-20 DIAGNOSIS — I6330 Cerebral infarction due to thrombosis of unspecified cerebral artery: Secondary | ICD-10-CM | POA: Insufficient documentation

## 2019-09-20 DIAGNOSIS — I6389 Other cerebral infarction: Secondary | ICD-10-CM

## 2019-09-20 DIAGNOSIS — I10 Essential (primary) hypertension: Secondary | ICD-10-CM | POA: Diagnosis not present

## 2019-09-20 DIAGNOSIS — I6603 Occlusion and stenosis of bilateral middle cerebral arteries: Secondary | ICD-10-CM | POA: Diagnosis not present

## 2019-09-20 LAB — HEMOGLOBIN A1C
Hgb A1c MFr Bld: 5.4 % (ref 4.8–5.6)
Mean Plasma Glucose: 108.28 mg/dL

## 2019-09-20 LAB — ECHOCARDIOGRAM COMPLETE: Weight: 3425.07 oz

## 2019-09-20 LAB — SARS CORONAVIRUS 2 (TAT 6-24 HRS): SARS Coronavirus 2: NEGATIVE

## 2019-09-20 LAB — LIPID PANEL
Cholesterol: 176 mg/dL (ref 0–200)
HDL: 44 mg/dL (ref >40)
LDL Cholesterol: 113 mg/dL — ABNORMAL HIGH (ref 0–99)
Total CHOL/HDL Ratio: 4 RATIO
Triglycerides: 96 mg/dL (ref <150)
VLDL: 19 mg/dL (ref 0–40)

## 2019-09-20 LAB — SEDIMENTATION RATE: Sed Rate: 4 mm/hr (ref 0–16)

## 2019-09-20 MED ORDER — IOHEXOL 350 MG/ML SOLN
75.0000 mL | Freq: Once | INTRAVENOUS | Status: AC | PRN
  Administered 2019-09-20: 75 mL via INTRAVENOUS

## 2019-09-20 MED ORDER — ENOXAPARIN SODIUM 40 MG/0.4ML ~~LOC~~ SOLN: 40.0000 mg | SUBCUTANEOUS | Status: DC

## 2019-09-20 MED ORDER — STROKE: EARLY STAGES OF RECOVERY BOOK: Freq: Once | Status: DC

## 2019-09-20 MED ORDER — ATORVASTATIN CALCIUM 40 MG PO TABS: 40.0000 mg | ORAL_TABLET | Freq: Every day | ORAL | Status: DC

## 2019-09-20 MED ORDER — LORAZEPAM 2 MG/ML IJ SOLN
1.0000 mg | Freq: Once | INTRAMUSCULAR | Status: AC
  Administered 2019-09-20: 1 mg via INTRAVENOUS
  Filled 2019-09-20: qty 1

## 2019-09-20 MED ORDER — CLOPIDOGREL BISULFATE 75 MG PO TABS: 75.0000 mg | ORAL_TABLET | Freq: Every day | ORAL | 0 refills | Status: AC

## 2019-09-20 MED ORDER — CLOPIDOGREL BISULFATE 75 MG PO TABS
75.0000 mg | ORAL_TABLET | Freq: Every day | ORAL | Status: DC
  Administered 2019-09-20: 75 mg via ORAL
  Filled 2019-09-20: qty 1

## 2019-09-20 MED ORDER — ACETAMINOPHEN 650 MG RE SUPP: 650.0000 mg | RECTAL | Status: DC | PRN

## 2019-09-20 MED ORDER — SODIUM CHLORIDE 0.9 % IV SOLN
INTRAVENOUS | Status: DC
  Administered 2019-09-20: 10:00:00 via INTRAVENOUS

## 2019-09-20 MED ORDER — ACETAMINOPHEN 325 MG PO TABS: 650.0000 mg | ORAL_TABLET | ORAL | Status: DC | PRN

## 2019-09-20 MED ORDER — ATORVASTATIN CALCIUM 40 MG PO TABS: 40.0000 mg | ORAL_TABLET | Freq: Every day | ORAL | 0 refills | Status: DC

## 2019-09-20 MED ORDER — ACETAMINOPHEN 160 MG/5ML PO SOLN: 650.0000 mg | ORAL | Status: DC | PRN

## 2019-09-20 NOTE — ED Notes (Signed)
ED TO INPATIENT HANDOFF REPORT  ED Nurse Name and Phone # [phone]  S Name/Age/Gender Bob Elliott 78 y.o. male Room/Bed: 036C/036C  Code Status   Code Status: Full Code  Home/SNF/Other Home Patient oriented to: self, place, time and situation Is this baseline? Yes   Triage Complete: Triage complete  Chief Complaint R eye vision loss, headache  Triage Note Pt with visual loss in the right eye with headache around 1940 this evening. Vision has returned at triage. EDP to screen at bridge. Labs and ekg collected.    Allergies Allergies  Allergen Reactions  . Aspirin Hives, Itching and Nausea Only  . Codeine Hives, Itching and Nausea Only  . Shellfish Allergy Hives    Level of Care/Admitting Diagnosis ED Disposition    ED Disposition Condition Comment   Admit  Hospital Area: [facility] [[id number]]  Level of Care: Telemetry Medical [104]  I expect the patient will be discharged within 24 hours: Yes  LOW acuity---Tx typically complete <24 hrs---ACUTE conditions typically can be evaluated <24 hours---LABS likely to return to acceptable levels <24 hours---IS near functional baseline---EXPECTED to return to current living arrangement---NOT newly hypoxic: Meets criteria for 5C-Observation unit  Covid Evaluation: Asymptomatic Screening Protocol (No Symptoms)  Diagnosis: Vision loss [id number]  Admitting Physician: Shela Leff [phone]  Attending Physician: Shela Leff [phone]  PT Class (Do Not Modify): Observation [104]  PT Acc Code (Do Not Modify): Observation [10022]       B Medical/Surgery History Past Medical History:  Diagnosis Date  . Arthritis   . BPH (benign prostatic hyperplasia)   . CKD (chronic kidney disease), stage II    a. CKD II-III by labs.  . Dizziness 10/21/2016  . Esophageal dysmotility   . Essential hypertension    Trial off acei 08/12/2017 for unexplained sob/ noct smothering   . GERD (gastroesophageal reflux  disease)   . GI bleed   . Glaucoma    BOTH EYES  . Hyperlipidemia   . Hypertension   . Inguinal hernia without mention of obstruction or gangrene, unilateral or unspecified, (not specified as recurrent)-right s/p repair with mesh 05/21/2014  . Schatzki's ring   . Sinus bradycardia    a. prompting dc of Diltiazem (?chronotropic incompetence) in 2017.  [name] Sleep apnea    "mild" no cpap  . Thoracic aortic aneurysm (TAA) ([place])    Past Surgical History:  Procedure Laterality Date  . COLONOSCOPY WITH PROPOFOL N/A 05/11/2016   Procedure: COLONOSCOPY WITH PROPOFOL;  Surgeon: Garlan Fair, MD;  Location: [facility];  Service: Endoscopy;  Laterality: N/A;  . EYE SURGERY     CATARACT R  EYE  . HERNIA REPAIR     UMBILICAL  . INGUINAL HERNIA REPAIR Right 06/18/2014   Procedure: RIGHT INGUINAL HERNIA REPAIR;  Surgeon: Odis Hollingshead, MD;  Location: [facility];  Service: General;  Laterality: Right;  . INSERTION OF MESH Right 06/18/2014   Procedure: INSERTION OF MESH;  Surgeon: Odis Hollingshead, MD;  Location: [facility];  Service: General;  Laterality: Right;  . NECK SURGERY  2000   cervical disc surgery(limited ROM)- retained hardware  . TONSILLECTOMY       A IV Location/Drains/Wounds Patient Lines/Drains/Airways Status   Active Line/Drains/Airways    Name:   Placement date:   Placement time:   Site:   Days:   Peripheral IV 09/19/19 Left Antecubital   09/19/19    2302    Antecubital   1   Incision (Closed)  06/18/14 Groin Right   06/18/14    0720     1920          Intake/Output Last 24 hours No intake or output data in the 24 hours ending 09/20/19 1243  Labs/Imaging Results for orders placed or performed during the hospital encounter of 09/19/19 (from the past 48 hour(s))  Protime-INR     Status: None   Collection Time: 09/19/19  8:46 PM  Result Value Ref Range   Prothrombin Time 13.0 11.4 - 15.2 seconds   INR 1.0 0.8 - 1.2    Comment: (NOTE) INR goal varies based on device and disease  states. Performed at [facility], [place] [address] [place], [place] [address]   APTT     Status: None   Collection Time: 09/19/19  8:46 PM  Result Value Ref Range   aPTT 27 24 - 36 seconds    Comment: Performed at [facility] [address] [place], [place] [address]  CBC     Status: None   Collection Time: 09/19/19  8:46 PM  Result Value Ref Range   WBC 6.3 4.0 - 10.5 K/uL   RBC 4.94 4.22 - 5.81 MIL/uL   Hemoglobin 15.2 13.0 - 17.0 g/dL   HCT 45.4 39.0 - 52.0 %   MCV 91.9 80.0 - 100.0 fL   MCH 30.8 26.0 - 34.0 pg   MCHC 33.5 30.0 - 36.0 g/dL   RDW 13.2 11.5 - 15.5 %   Platelets 196 150 - 400 K/uL   nRBC 0.0 0.0 - 0.2 %    Comment: Performed at [facility], [place] [address], [place], [place] [address]  Differential     Status: None   Collection Time: 09/19/19  8:46 PM  Result Value Ref Range   Neutrophils Relative % 62 %   Neutro Abs 4.0 1.7 - 7.7 K/uL   Lymphocytes Relative 21 %   Lymphs Abs 1.3 0.7 - 4.0 K/uL   Monocytes Relative 12 %   Monocytes Absolute 0.7 0.1 - 1.0 K/uL   Eosinophils Relative 3 %   Eosinophils Absolute 0.2 0.0 - 0.5 K/uL   Basophils Relative 1 %   Basophils Absolute 0.1 0.0 - 0.1 K/uL   Immature Granulocytes 1 %   Abs Immature Granulocytes 0.04 0.00 - 0.07 K/uL    Comment: Performed at [facility] [address], [place], [place] [address]  Comprehensive metabolic panel     Status: Abnormal   Collection Time: 09/19/19  8:46 PM  Result Value Ref Range   Sodium 139 135 - 145 mmol/L   Potassium 4.0 3.5 - 5.1 mmol/L   Chloride 106 98 - 111 mmol/L   CO2 25 22 - 32 mmol/L   Glucose, Bld 112 (H) 70 - 99 mg/dL   BUN 14 8 - 23 mg/dL   Creatinine, Ser 1.34 (H) 0.61 - 1.24 mg/dL   Calcium 9.2 8.9 - 10.3 mg/dL   Total Protein 6.7 6.5 - 8.1 g/dL   Albumin 3.8 3.5 - 5.0 g/dL   AST 16 15 - 41 U/L   ALT 16 0 - 44 U/L   Alkaline Phosphatase 57 38 - 126 U/L   Total Bilirubin 0.9 0.3 - 1.2 mg/dL   GFR calc non Af Amer 50 (L) >60  mL/min   GFR calc Af Amer 58 (L) >60 mL/min   Anion gap 8 5 - 15    Comment: Performed at [facility], [address] [place], [place] [address]  CBG monitoring, ED     Status: Abnormal   Collection Time: 09/19/19  8:50 PM  Result Value Ref Range   Glucose-Capillary 105 (H) 70 - 99 mg/dL  I-stat chem 8, ED     Status: Abnormal   Collection Time: 09/19/19  9:01 PM  Result Value Ref Range   Sodium 141 135 - 145 mmol/L   Potassium 4.0 3.5 - 5.1 mmol/L   Chloride 102 98 - 111 mmol/L   BUN 15 8 - 23 mg/dL   Creatinine, Ser 1.40 (H) 0.61 - 1.24 mg/dL   Glucose, Bld 108 (H) 70 - 99 mg/dL   Calcium, Ion 1.23 1.15 - 1.40 mmol/L   TCO2 26 22 - 32 mmol/L   Hemoglobin 15.0 13.0 - 17.0 g/dL   HCT 44.0 39.0 - 52.0 %  SARS CORONAVIRUS 2 (TAT 6-24 HRS) Nasopharyngeal Nasopharyngeal Swab     Status: None   Collection Time: 09/19/19 11:24 PM   Specimen: Nasopharyngeal Swab  Result Value Ref Range   SARS Coronavirus 2 NEGATIVE NEGATIVE    Comment: (NOTE) SARS-CoV-2 target nucleic acids are NOT DETECTED. The SARS-CoV-2 RNA is generally detectable in upper and lower respiratory specimens during the acute phase of infection. Negative results do not preclude SARS-CoV-2 infection, do not rule out co-infections with other pathogens, and should not be used as the sole basis for treatment or other patient management decisions. Negative results must be combined with clinical observations, patient history, and epidemiological information. The expected result is Negative. Fact Sheet for Patients: SugarRoll.be Fact Sheet for Healthcare Providers: https://www.woods-mathews.com/ This test is not yet approved or cleared by the [place] FDA and  has been authorized for detection and/or diagnosis of SARS-CoV-2 by FDA under an Emergency Use Authorization (EUA). This EUA will remain  in effect (meaning this test can be used) for the duration of the COVID-19  declaration under Section 56 4(b)(1) of the Act, 21 U.S.C. section 360bbb-3(b)(1), unless the authorization is terminated or revoked sooner. Performed at [facility], [place] [address] [place], [place] [address]   Hemoglobin A1c     Status: None   Collection Time: 09/20/19  7:40 AM  Result Value Ref Range   Hgb A1c MFr Bld 5.4 4.8 - 5.6 %    Comment: (NOTE) Pre diabetes:          5.7%-6.4% Diabetes:              >6.4% Glycemic control for   <7.0% adults with diabetes    Mean Plasma Glucose 108.28 mg/dL    Comment: Performed at [facility] [address], [place], [place] [address]  Lipid panel     Status: Abnormal   Collection Time: 09/20/19  7:40 AM  Result Value Ref Range   Cholesterol 176 0 - 200 mg/dL   Triglycerides 96 <150 mg/dL   HDL 44 >40 mg/dL   Total CHOL/HDL Ratio 4.0 RATIO   VLDL 19 0 - 40 mg/dL   LDL Cholesterol 113 (H) 0 - 99 mg/dL    Comment:        Total Cholesterol/HDL:CHD Risk Coronary Heart Disease Risk Table                     Men   Women  1/2 Average Risk   3.4   3.3  Average Risk       5.0   4.4  2 X  Average Risk   9.6   7.1  3 X Average Risk  23.4   11.0        Use the calculated Patient Ratio above and the CHD Risk Table to determine the patient's CHD Risk.        ATP III CLASSIFICATION (LDL):  <100     mg/dL   Optimal  100-129  mg/dL   Near or Above                    Optimal  130-159  mg/dL   Borderline  160-189  mg/dL   High  >190     mg/dL   Very High Performed at [facility] [address], [place], [place] [address]    Mr Angio Head Wo Contrast  Result Date: 09/20/2019 CLINICAL DATA:  Initial evaluation for transient right-sided visual loss. EXAM: MRI HEAD WITHOUT CONTRAST MRA HEAD WITHOUT CONTRAST TECHNIQUE: Multiplanar, multiecho pulse sequences of the brain and surrounding structures were obtained without intravenous contrast. Angiographic images of the head were obtained using MRA technique without contrast.  COMPARISON:  Prior head CT from 09/19/2019. FINDINGS: MRI HEAD FINDINGS Brain: Generalized age-related cerebral atrophy. Mild scattered T2/FLAIR hyperintensities noted within the periventricular, deep, and subcortical white matter both cerebral hemispheres, nonspecific, but most like related chronic micro vessel ischemic disease, mild for age. No abnormal foci of restricted diffusion to suggest acute or subacute ischemia. Gray-white matter differentiation maintained. No encephalomalacia to suggest chronic cortical infarction. No foci of susceptibility artifact to suggest acute or chronic intracranial hemorrhage. No mass lesion, midline shift or mass effect. No hydrocephalus. No extra-axial fluid collection. Pituitary gland normal. Vascular: Major intracranial vascular flow voids are maintained. Skull and upper cervical spine: Craniocervical junction normal. Bone marrow signal intensity within normal limits. No scalp soft tissue abnormality. Sinuses/Orbits: Patient status post ocular lens replacement on the right. Globes and orbital soft tissues demonstrate no acute finding. Paranasal sinuses are largely clear. No significant mastoid effusion. Inner ear structures grossly normal. Other: None. MRA HEAD FINDINGS ANTERIOR CIRCULATION: Distal cervical segments of the internal carotid arteries are patent with symmetric antegrade flow. Petrous, cavernous, and supraclinoid ICAs patent without hemodynamically significant stenosis. Origin of the ophthalmic arteries patent bilaterally. ICA termini well perfused. A1 segments patent bilaterally. Normal anterior communicating artery. Anterior cerebral arteries patent to their distal aspects without stenosis. Left M1 widely patent. Short-segment mild stenosis noted at the mid right M1 segment (series 1050, image 8). Normal MCA bifurcations. Distal MCA branches well perfused and symmetric. Diffuse small vessel atheromatous irregularity noted bilaterally. POSTERIOR CIRCULATION:  Vertebral arteries patent to the vertebrobasilar junction without stenosis. Patent left PICA. Right PICA not seen. Basilar widely patent to its distal aspect without stenosis. Superior cerebral arteries demonstrate atheromatous irregularity but are widely patent bilaterally. Both PCAs primarily supplied via the basilar. Scattered atheromatous irregularity throughout the PCAs without hemodynamically significant stenosis. No intracranial aneurysm. IMPRESSION: MRI HEAD IMPRESSION: 1. No acute intracranial infarct or other abnormality identified. 2. Mild age-related cerebral atrophy with chronic small vessel ischemic disease. MRA HEAD IMPRESSION: 1. Negative intracranial MRA for large vessel occlusion. 2. Mild intracranial atherosclerotic change for age. No hemodynamically significant or correctable stenosis identified. Electronically Signed   By: Jeannine Boga M.D.   On: 09/20/2019 02:41   Mr Brain Wo Contrast  Result Date: 09/20/2019 CLINICAL DATA:  Initial evaluation for transient right-sided visual loss. EXAM: MRI HEAD WITHOUT CONTRAST MRA HEAD WITHOUT CONTRAST TECHNIQUE: Multiplanar, multiecho pulse sequences of  the brain and surrounding structures were obtained without intravenous contrast. Angiographic images of the head were obtained using MRA technique without contrast. COMPARISON:  Prior head CT from 09/19/2019. FINDINGS: MRI HEAD FINDINGS Brain: Generalized age-related cerebral atrophy. Mild scattered T2/FLAIR hyperintensities noted within the periventricular, deep, and subcortical white matter both cerebral hemispheres, nonspecific, but most like related chronic micro vessel ischemic disease, mild for age. No abnormal foci of restricted diffusion to suggest acute or subacute ischemia. Gray-white matter differentiation maintained. No encephalomalacia to suggest chronic cortical infarction. No foci of susceptibility artifact to suggest acute or chronic intracranial hemorrhage. No mass lesion,  midline shift or mass effect. No hydrocephalus. No extra-axial fluid collection. Pituitary gland normal. Vascular: Major intracranial vascular flow voids are maintained. Skull and upper cervical spine: Craniocervical junction normal. Bone marrow signal intensity within normal limits. No scalp soft tissue abnormality. Sinuses/Orbits: Patient status post ocular lens replacement on the right. Globes and orbital soft tissues demonstrate no acute finding. Paranasal sinuses are largely clear. No significant mastoid effusion. Inner ear structures grossly normal. Other: None. MRA HEAD FINDINGS ANTERIOR CIRCULATION: Distal cervical segments of the internal carotid arteries are patent with symmetric antegrade flow. Petrous, cavernous, and supraclinoid ICAs patent without hemodynamically significant stenosis. Origin of the ophthalmic arteries patent bilaterally. ICA termini well perfused. A1 segments patent bilaterally. Normal anterior communicating artery. Anterior cerebral arteries patent to their distal aspects without stenosis. Left M1 widely patent. Short-segment mild stenosis noted at the mid right M1 segment (series 1050, image 8). Normal MCA bifurcations. Distal MCA branches well perfused and symmetric. Diffuse small vessel atheromatous irregularity noted bilaterally. POSTERIOR CIRCULATION: Vertebral arteries patent to the vertebrobasilar junction without stenosis. Patent left PICA. Right PICA not seen. Basilar widely patent to its distal aspect without stenosis. Superior cerebral arteries demonstrate atheromatous irregularity but are widely patent bilaterally. Both PCAs primarily supplied via the basilar. Scattered atheromatous irregularity throughout the PCAs without hemodynamically significant stenosis. No intracranial aneurysm. IMPRESSION: MRI HEAD IMPRESSION: 1. No acute intracranial infarct or other abnormality identified. 2. Mild age-related cerebral atrophy with chronic small vessel ischemic disease. MRA HEAD  IMPRESSION: 1. Negative intracranial MRA for large vessel occlusion. 2. Mild intracranial atherosclerotic change for age. No hemodynamically significant or correctable stenosis identified. Electronically Signed   By: Jeannine Boga M.D.   On: 09/20/2019 02:41   Ct Head Code Stroke Wo Contrast  Result Date: 09/19/2019 CLINICAL DATA:  Code stroke.  Vision loss. EXAM: CT HEAD WITHOUT CONTRAST TECHNIQUE: Contiguous axial images were obtained from the base of the skull through the vertex without intravenous contrast. COMPARISON:  Head MRI 02/27/2004 FINDINGS: Brain: An approximately 1.7 cm region of hyperattenuation in the region of the right lentiform nucleus and internal capsule is without edema or mass effect and is most compatible with calcification related to the developmental venous anomaly in this region demonstrated on prior MRI. There is no evidence of acute infarct, intracranial hemorrhage, mass, midline shift, or extra-axial fluid collection. The ventricles and sulci are within normal limits for age. Vascular: Calcified atherosclerosis at the skull base. 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 Skull: No fracture or focal osseous lesion. Sinuses/Orbits: Visualized paranasal sinuses and mastoid air cells are clear. Right cataract extraction is noted. Other: None. ASPECTS [facility] Stroke Program Early CT Score) - Ganglionic level infarction (caudate, lentiform nuclei, internal capsule, insula, M1-M3 cortex): 7 - Supraganglionic infarction (M4-M6 cortex): 3 Total score (0-10 with 10 being normal): 10 IMPRESSION: 1. No evidence of acute intracranial abnormality. 2. ASPECTS is  10. 3. 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 These results were communicated to Dr. Cheral Marker at 9:17 pm on 09/19/2019 by text page via the [facility] messaging system. Electronically Signed   By: Logan Bores M.D.   On: 09/19/2019 21:18   Vas US Carotid (at [facility] Only)  Result  Date: 09/20/2019 Carotid Arterial Duplex Study Indications:       CVA. Risk Factors:      Hypertension, hyperlipidemia. Comparison Study:  no prior Performing Technologist: Abram Sander RVS  Examination Guidelines: A complete evaluation includes B-mode imaging, spectral Doppler, color Doppler, and power Doppler as needed of all accessible portions of each vessel. Bilateral testing is considered an integral part of a complete examination. Limited examinations for reoccurring indications may be performed as noted.  Right Carotid Findings: +----------+--------+--------+--------+------------------+--------+           PSV cm/sEDV cm/sStenosisPlaque DescriptionComments +----------+--------+--------+--------+------------------+--------+ CCA Prox  74      14              heterogenous               +----------+--------+--------+--------+------------------+--------+ CCA Distal49      16              heterogenous               +----------+--------+--------+--------+------------------+--------+ ICA Prox  34      8       1-39%   heterogenous               +----------+--------+--------+--------+------------------+--------+ ICA Distal55      18                                         +----------+--------+--------+--------+------------------+--------+ ECA       71      11                                         +----------+--------+--------+--------+------------------+--------+ +----------+--------+-------+--------+-------------------+           PSV cm/sEDV cmsDescribeArm Pressure (mmHG) +----------+--------+-------+--------+-------------------+ Subclavian105                                        +----------+--------+-------+--------+-------------------+ +---------+--------+--+--------+-+---------+ VertebralPSV cm/s34EDV cm/s9Antegrade +---------+--------+--+--------+-+---------+  Left Carotid Findings: +----------+--------+--------+--------+------------------+--------+            PSV cm/sEDV cm/sStenosisPlaque DescriptionComments +----------+--------+--------+--------+------------------+--------+ CCA Prox  113     22              heterogenous               +----------+--------+--------+--------+------------------+--------+ CCA Distal82      16              heterogenous               +----------+--------+--------+--------+------------------+--------+ ICA Prox  40      13      1-39%   heterogenous               +----------+--------+--------+--------+------------------+--------+ ICA Distal42      18                                         +----------+--------+--------+--------+------------------+--------+  ECA       73      9                                          +----------+--------+--------+--------+------------------+--------+ +----------+--------+--------+--------+-------------------+           PSV cm/sEDV cm/sDescribeArm Pressure (mmHG) +----------+--------+--------+--------+-------------------+ [id number]                                          +----------+--------+--------+--------+-------------------+ +---------+--------+--+--------+--+ VertebralPSV cm/s34EDV cm/s10 +---------+--------+--+--------+--+  Summary: Right Carotid: Velocities in the right ICA are consistent with a 1-39% stenosis. Left Carotid: Velocities in the left ICA are consistent with a 1-39% stenosis. Vertebrals: Bilateral vertebral arteries demonstrate antegrade flow. *See table(s) above for measurements and observations.     Preliminary     Pending Labs Unresulted Labs (From admission, onward)   None      Vitals/Pain Today's Vitals   09/20/19 0430 09/20/19 0500 09/20/19 0545 09/20/19 0900  BP: 130/77 120/61 (!) 113/58 (!) 146/79  Pulse:    (!) 58  Resp: 20 20 19 20   Temp:      TempSrc:      SpO2:    96%  Weight:      PainSc:        Isolation Precautions No active isolations  Medications Medications   stroke: mapping  our early stages of recovery book (has no administration in time range)  acetaminophen (TYLENOL) tablet 650 mg (has no administration in time range)    Or  acetaminophen (TYLENOL) 160 MG/5ML solution 650 mg (has no administration in time range)    Or  acetaminophen (TYLENOL) suppository 650 mg (has no administration in time range)  enoxaparin (LOVENOX) injection 40 mg (40 mg Subcutaneous Not Given 09/20/19 1051)  clopidogrel (PLAVIX) tablet 75 mg (75 mg Oral Given 09/20/19 0318)  0.9 %  sodium chloride infusion ( Intravenous New Bag/Given 09/20/19 1020)  atorvastatin (LIPITOR) tablet 40 mg (has no administration in time range)  sodium chloride flush (NS) 0.9 % injection 3 mL (3 mLs Intravenous Given 09/19/19 2346)  LORazepam (ATIVAN) injection 1 mg (1 mg Intravenous Given 09/20/19 0120)  iohexol (OMNIPAQUE) 350 MG/ML injection 75 mL (75 mLs Intravenous Contrast Given 09/20/19 1221)    Mobility walks Low fall risk   Focused Assessments    R Recommendations: See Admitting Provider Note  Report given to:   Additional Notes:

## 2019-09-20 NOTE — Progress Notes (Signed)
Carotid duplex has been completed.   Preliminary results in CV Proc.   Abram Sander 09/20/2019 9:53 AM

## 2019-09-20 NOTE — Evaluation (Signed)
Physical Therapy Evaluation Patient Details Name: Bob Elliott MRN: [id number] DOB: [birth year] Today's Date: 09/20/2019   History of Present Illness  Bob Elliott is a 78 y.o. male with medical history significant of hypertension, hyperlipidemia, thoracic aortic aneurysm, glaucoma, CKD stage II, BPH presenting with complaints of right eye vision loss.  Clinical Impression  Patient presents with mobility at baseline.  Some baseline high level balance deficits so reviewed fall prevention information and educated on stroke warning signs.  Feel he is safe for d/c home with wife support and no follow up PT needs.  Will sign off as pt feels he is at functional baseline.      Follow Up Recommendations No PT follow up    Equipment Recommendations  None recommended by PT    Recommendations for Other Services       Precautions / Restrictions Precautions Precautions: Fall      Mobility  Bed Mobility Overal bed mobility: Modified Independent                Transfers Overall transfer level: Modified independent                  Ambulation/Gait Ambulation/Gait assistance: Supervision;Min guard Gait Distance (Feet): 300 Feet Assistive device: None Gait Pattern/deviations: Step-through pattern;Decreased stride length     General Gait Details: wearing shoes with some instability when stepping over and around, but no significant LOB; pt reports baseline mobility  Stairs            Wheelchair Mobility    Modified Rankin (Stroke Patients Only) Modified Rankin (Stroke Patients Only) Pre-Morbid Rankin Score: No significant disability Modified Rankin: Slight disability     Balance Overall balance assessment: Needs assistance   Sitting balance-Leahy Scale: Good Sitting balance - Comments: reaches to floor to don shoes     Standing balance-Leahy Scale: Good Standing balance comment: no LOB static standing, mild instability with gait, see below DGI                 Standardized Balance Assessment Standardized Balance Assessment : Dynamic Gait Index   Dynamic Gait Index Level Surface: Mild Impairment Change in Gait Speed: Mild Impairment Gait with Horizontal Head Turns: Mild Impairment Gait with Vertical Head Turns: Mild Impairment Gait and Pivot Turn: Mild Impairment Step Over Obstacle: Mild Impairment Step Around Obstacles: Mild Impairment Steps: (stairs NT)       Pertinent Vitals/Pain Pain Assessment: Faces Faces Pain Scale: Hurts little more Pain Location: L shoulder, hips (arthritis) Pain Descriptors / Indicators: Aching Pain Intervention(s): Monitored during session;Repositioned    Home Living Family/patient expects to be discharged to:: Private residence Living Arrangements: Spouse/significant other Available Help at Discharge: Family Type of Home: House Home Access: Stairs to enter   Technical brewer of Steps: 1 Home Layout: One level Home Equipment: Cane - single point;Grab bars - tub/shower      Prior Function Level of Independence: Independent               Hand Dominance   Dominant Hand: Right    Extremity/Trunk Assessment   Upper Extremity Assessment Upper Extremity Assessment: Overall WFL for tasks assessed    Lower Extremity Assessment Lower Extremity Assessment: Overall WFL for tasks assessed    Cervical / Trunk Assessment Cervical / Trunk Assessment: Kyphotic  Communication   Communication: No difficulties  Cognition Arousal/Alertness: Awake/alert Behavior During Therapy: WFL for tasks assessed/performed Overall Cognitive Status: Within Functional Limits for tasks assessed  General Comments General comments (skin integrity, edema, etc.): Educatged in fall prevention including importance of footwear, lighting, sturdy railings and nonslip surface in tub; also educated on BE FAST for stroke warning signs; visual fields tested  without signs of significant field losses; reports baseline deficits in R eye; wears bifocals    Exercises     Assessment/Plan    PT Assessment Patent does not need any further PT services  PT Problem List         PT Treatment Interventions      PT Goals (Current goals can be found in the Care Plan section)  Acute Rehab PT Goals PT Goal Formulation: All assessment and education complete, DC therapy    Frequency     Barriers to discharge        Co-evaluation               AM-PAC PT "6 Clicks" Mobility  Outcome Measure Help needed turning from your back to your side while in a flat bed without using bedrails?: None Help needed moving from lying on your back to sitting on the side of a flat bed without using bedrails?: None Help needed moving to and from a bed to a chair (including a wheelchair)?: None Help needed standing up from a chair using your arms (e.g., wheelchair or bedside chair)?: None Help needed to walk in hospital room?: None Help needed climbing 3-5 steps with a railing? : A Little 6 Click Score: 23    End of Session   Activity Tolerance: Patient tolerated treatment well Patient left: in bed;with call bell/phone within reach   PT Visit Diagnosis: Other abnormalities of gait and mobility (R26.89)    Time: [id number] PT Time Calculation (min) (ACUTE ONLY): 24 min   Charges:   PT Evaluation $PT Eval Low Complexity: 1 Low PT Treatments $Self Care/Home Management: [employer], [place] [phone] 09/20/2019   Reginia Naas 09/20/2019, 9:53 AM

## 2019-09-20 NOTE — ED Notes (Signed)
Lunch Tray Ordered @ [address], RN called by Levada Dy

## 2019-09-20 NOTE — Progress Notes (Signed)
STROKE TEAM PROGRESS NOTE   INTERVAL HISTORY Pt sitting in bed, stated that his right vision loss has resolved. He does have HA for the last week but at right frontal area. I palpated his right temporal area, no abnormal temporal artery noted. No pain on palpation. ESR pending. CTA neck unremarkable. Denies hx of migraine.   Vitals:   09/20/19 0430 09/20/19 0500 09/20/19 0545 09/20/19 0900  BP: 130/77 120/61 (!) 113/58 (!) 146/79  Pulse:    (!) 58  Resp: 20 20 19 20   Temp:      TempSrc:      SpO2:    96%  Weight:        CBC:  Recent Labs  Lab 09/19/19 2046 09/19/19 2101  WBC 6.3  --   NEUTROABS 4.0  --   HGB 15.2 15.0  HCT 45.4 44.0  MCV 91.9  --   PLT 196  --     Basic Metabolic Panel:  Recent Labs  Lab 09/19/19 2046 09/19/19 2101  NA 139 141  K 4.0 4.0  CL 106 102  CO2 25  --   GLUCOSE 112* 108*  BUN 14 15  CREATININE 1.34* 1.40*  CALCIUM 9.2  --    Lipid Panel:     Component Value Date/Time   CHOL 176 09/20/2019 0740   TRIG 96 09/20/2019 0740   HDL 44 09/20/2019 0740   CHOLHDL 4.0 09/20/2019 0740   VLDL 19 09/20/2019 0740   LDLCALC 113 (H) 09/20/2019 0740   HgbA1c:  Lab Results  Component Value Date   HGBA1C 5.4 09/20/2019   Urine Drug Screen: No results found for: LABOPIA, COCAINSCRNUR, LABBENZ, AMPHETMU, THCU, LABBARB  Alcohol Level No results found for: [facility]  IMAGING Mr Angio Head Wo Contrast  Result Date: 09/20/2019 CLINICAL DATA:  Initial evaluation for transient right-sided visual loss. EXAM: MRI HEAD WITHOUT CONTRAST MRA HEAD WITHOUT CONTRAST TECHNIQUE: Multiplanar, multiecho pulse sequences of the brain and surrounding structures were obtained without intravenous contrast. Angiographic images of the head were obtained using MRA technique without contrast. COMPARISON:  Prior head CT from 09/19/2019. FINDINGS: MRI HEAD FINDINGS Brain: Generalized age-related cerebral atrophy. Mild scattered T2/FLAIR hyperintensities noted within the  periventricular, deep, and subcortical white matter both cerebral hemispheres, nonspecific, but most like related chronic micro vessel ischemic disease, mild for age. No abnormal foci of restricted diffusion to suggest acute or subacute ischemia. Gray-white matter differentiation maintained. No encephalomalacia to suggest chronic cortical infarction. No foci of susceptibility artifact to suggest acute or chronic intracranial hemorrhage. No mass lesion, midline shift or mass effect. No hydrocephalus. No extra-axial fluid collection. Pituitary gland normal. Vascular: Major intracranial vascular flow voids are maintained. Skull and upper cervical spine: Craniocervical junction normal. Bone marrow signal intensity within normal limits. No scalp soft tissue abnormality. Sinuses/Orbits: Patient status post ocular lens replacement on the right. Globes and orbital soft tissues demonstrate no acute finding. Paranasal sinuses are largely clear. No significant mastoid effusion. Inner ear structures grossly normal. Other: None. MRA HEAD FINDINGS ANTERIOR CIRCULATION: Distal cervical segments of the internal carotid arteries are patent with symmetric antegrade flow. Petrous, cavernous, and supraclinoid ICAs patent without hemodynamically significant stenosis. Origin of the ophthalmic arteries patent bilaterally. ICA termini well perfused. A1 segments patent bilaterally. Normal anterior communicating artery. Anterior cerebral arteries patent to their distal aspects without stenosis. Left M1 widely patent. Short-segment mild stenosis noted at the mid right M1 segment (series 1050, image 8). Normal MCA bifurcations. Distal MCA branches well perfused  and symmetric. Diffuse small vessel atheromatous irregularity noted bilaterally. POSTERIOR CIRCULATION: Vertebral arteries patent to the vertebrobasilar junction without stenosis. Patent left PICA. Right PICA not seen. Basilar widely patent to its distal aspect without stenosis.  Superior cerebral arteries demonstrate atheromatous irregularity but are widely patent bilaterally. Both PCAs primarily supplied via the basilar. Scattered atheromatous irregularity throughout the PCAs without hemodynamically significant stenosis. No intracranial aneurysm. IMPRESSION: MRI HEAD IMPRESSION: 1. No acute intracranial infarct or other abnormality identified. 2. Mild age-related cerebral atrophy with chronic small vessel ischemic disease. MRA HEAD IMPRESSION: 1. Negative intracranial MRA for large vessel occlusion. 2. Mild intracranial atherosclerotic change for age. No hemodynamically significant or correctable stenosis identified. Electronically Signed   By: Jeannine Boga M.D.   On: 09/20/2019 02:41   Mr Brain Wo Contrast  Result Date: 09/20/2019 CLINICAL DATA:  Initial evaluation for transient right-sided visual loss. EXAM: MRI HEAD WITHOUT CONTRAST MRA HEAD WITHOUT CONTRAST TECHNIQUE: Multiplanar, multiecho pulse sequences of the brain and surrounding structures were obtained without intravenous contrast. Angiographic images of the head were obtained using MRA technique without contrast. COMPARISON:  Prior head CT from 09/19/2019. FINDINGS: MRI HEAD FINDINGS Brain: Generalized age-related cerebral atrophy. Mild scattered T2/FLAIR hyperintensities noted within the periventricular, deep, and subcortical white matter both cerebral hemispheres, nonspecific, but most like related chronic micro vessel ischemic disease, mild for age. No abnormal foci of restricted diffusion to suggest acute or subacute ischemia. Gray-white matter differentiation maintained. No encephalomalacia to suggest chronic cortical infarction. No foci of susceptibility artifact to suggest acute or chronic intracranial hemorrhage. No mass lesion, midline shift or mass effect. No hydrocephalus. No extra-axial fluid collection. Pituitary gland normal. Vascular: Major intracranial vascular flow voids are maintained. Skull and  upper cervical spine: Craniocervical junction normal. Bone marrow signal intensity within normal limits. No scalp soft tissue abnormality. Sinuses/Orbits: Patient status post ocular lens replacement on the right. Globes and orbital soft tissues demonstrate no acute finding. Paranasal sinuses are largely clear. No significant mastoid effusion. Inner ear structures grossly normal. Other: None. MRA HEAD FINDINGS ANTERIOR CIRCULATION: Distal cervical segments of the internal carotid arteries are patent with symmetric antegrade flow. Petrous, cavernous, and supraclinoid ICAs patent without hemodynamically significant stenosis. Origin of the ophthalmic arteries patent bilaterally. ICA termini well perfused. A1 segments patent bilaterally. Normal anterior communicating artery. Anterior cerebral arteries patent to their distal aspects without stenosis. Left M1 widely patent. Short-segment mild stenosis noted at the mid right M1 segment (series 1050, image 8). Normal MCA bifurcations. Distal MCA branches well perfused and symmetric. Diffuse small vessel atheromatous irregularity noted bilaterally. POSTERIOR CIRCULATION: Vertebral arteries patent to the vertebrobasilar junction without stenosis. Patent left PICA. Right PICA not seen. Basilar widely patent to its distal aspect without stenosis. Superior cerebral arteries demonstrate atheromatous irregularity but are widely patent bilaterally. Both PCAs primarily supplied via the basilar. Scattered atheromatous irregularity throughout the PCAs without hemodynamically significant stenosis. No intracranial aneurysm. IMPRESSION: MRI HEAD IMPRESSION: 1. No acute intracranial infarct or other abnormality identified. 2. Mild age-related cerebral atrophy with chronic small vessel ischemic disease. MRA HEAD IMPRESSION: 1. Negative intracranial MRA for large vessel occlusion. 2. Mild intracranial atherosclerotic change for age. No hemodynamically significant or correctable stenosis  identified. Electronically Signed   By: Jeannine Boga M.D.   On: 09/20/2019 02:41   Ct Head Code Stroke Wo Contrast  Result Date: 09/19/2019 CLINICAL DATA:  Code stroke.  Vision loss. EXAM: CT HEAD WITHOUT CONTRAST TECHNIQUE: Contiguous axial images were obtained from the base of the  skull through the vertex without intravenous contrast. COMPARISON:  Head MRI 02/27/2004 FINDINGS: Brain: An approximately 1.7 cm region of hyperattenuation in the region of the right lentiform nucleus and internal capsule is without edema or mass effect and is most compatible with calcification related to the developmental venous anomaly in this region demonstrated on prior MRI. There is no evidence of acute infarct, intracranial hemorrhage, mass, midline shift, or extra-axial fluid collection. The ventricles and sulci are within normal limits for age. Vascular: Calcified atherosclerosis at the skull base. 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 Skull: No fracture or focal osseous lesion. Sinuses/Orbits: Visualized paranasal sinuses and mastoid air cells are clear. Right cataract extraction is noted. Other: None. ASPECTS [facility] Stroke Program Early CT Score) - Ganglionic level infarction (caudate, lentiform nuclei, internal capsule, insula, M1-M3 cortex): 7 - Supraganglionic infarction (M4-M6 cortex): 3 Total score (0-10 with 10 being normal): 10 IMPRESSION: 1. No evidence of acute intracranial abnormality. 2. ASPECTS is 10. 3. 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 These results were communicated to Dr. Cheral Marker at 9:17 pm on 09/19/2019 by text page via the [facility] messaging system. Electronically Signed   By: Logan Bores M.D.   On: 09/19/2019 21:18   Vas US Carotid (at [place] Only)  Result Date: 09/20/2019 Carotid Arterial Duplex Study Indications:       CVA. Risk Factors:      Hypertension, hyperlipidemia. Comparison Study:  no prior Performing Technologist:  Abram Sander RVS  Examination Guidelines: A complete evaluation includes B-mode imaging, spectral Doppler, color Doppler, and power Doppler as needed of all accessible portions of each vessel. Bilateral testing is considered an integral part of a complete examination. Limited examinations for reoccurring indications may be performed as noted.  Right Carotid Findings: +----------+--------+--------+--------+------------------+--------+           PSV cm/sEDV cm/sStenosisPlaque DescriptionComments +----------+--------+--------+--------+------------------+--------+ CCA Prox  74      14              heterogenous               +----------+--------+--------+--------+------------------+--------+ CCA Distal49      16              heterogenous               +----------+--------+--------+--------+------------------+--------+ ICA Prox  34      8       1-39%   heterogenous               +----------+--------+--------+--------+------------------+--------+ ICA Distal55      18                                         +----------+--------+--------+--------+------------------+--------+ ECA       71      11                                         +----------+--------+--------+--------+------------------+--------+ +----------+--------+-------+--------+-------------------+           PSV cm/sEDV cmsDescribeArm Pressure (mmHG) +----------+--------+-------+--------+-------------------+ Subclavian105                                        +----------+--------+-------+--------+-------------------+ +---------+--------+--+--------+-+---------+ VertebralPSV cm/s34EDV  cm/s9Antegrade +---------+--------+--+--------+-+---------+  Left Carotid Findings: +----------+--------+--------+--------+------------------+--------+           PSV cm/sEDV cm/sStenosisPlaque DescriptionComments +----------+--------+--------+--------+------------------+--------+ CCA Prox  113     22               heterogenous               +----------+--------+--------+--------+------------------+--------+ CCA Distal82      16              heterogenous               +----------+--------+--------+--------+------------------+--------+ ICA Prox  40      13      1-39%   heterogenous               +----------+--------+--------+--------+------------------+--------+ ICA Distal42      18                                         +----------+--------+--------+--------+------------------+--------+ ECA       73      9                                          +----------+--------+--------+--------+------------------+--------+ +----------+--------+--------+--------+-------------------+           PSV cm/sEDV cm/sDescribeArm Pressure (mmHG) +----------+--------+--------+--------+-------------------+ [id number]                                          +----------+--------+--------+--------+-------------------+ +---------+--------+--+--------+--+ VertebralPSV cm/s34EDV cm/s10 +---------+--------+--+--------+--+  Summary: Right Carotid: Velocities in the right ICA are consistent with a 1-39% stenosis. Left Carotid: Velocities in the left ICA are consistent with a 1-39% stenosis. Vertebrals: Bilateral vertebral arteries demonstrate antegrade flow. *See table(s) above for measurements and observations.     Preliminary     PHYSICAL EXAM  Temp:  [97.8 F (36.6 C)] 97.8 F (36.6 C) (11/04 2035) Pulse Rate:  [58-83] 58 (11/05 0900) Resp:  [16-27] 20 (11/05 0900) BP: (113-163)/(58-83) 146/79 (11/05 0900) SpO2:  [94 %-98 %] 96 % (11/05 0900) Weight:  [97.1 kg] 97.1 kg (11/04 2110)  General - Well nourished, well developed, in no apparent distress.  Ophthalmologic - fundi not visualized due to noncooperation.  Cardiovascular - Regular rhythm and rate.  Mental Status -  Level of arousal and orientation to time, place, and person were intact. Language including expression, naming,  repetition, comprehension was assessed and found intact. Fund of Knowledge was assessed and was intact.  Cranial Nerves II - XII - II - Visual field intact OU. III, IV, VI - Extraocular movements intact. V - Facial sensation intact bilaterally. Right temporal area palpitation no abnormal temporal artery noted, no tenderness on palpation  VII - Facial movement intact bilaterally. VIII - Hearing & vestibular intact bilaterally. X - Palate elevates symmetrically. XI - Chin turning & shoulder shrug intact bilaterally. XII - Tongue protrusion intact.  Motor Strength - The patient's strength was normal in all extremities and pronator drift was absent.  Bulk was normal and fasciculations were absent.   Motor Tone - Muscle tone was assessed at the neck and appendages and was normal.  Reflexes - The patient's reflexes were symmetrical in all extremities and he had no pathological reflexes.  Sensory -  Light touch, temperature/pinprick were assessed and were symmetrical.    Coordination - The patient had normal movements in the hands and feet with no ataxia or dysmetria.  Tremor was absent.  Gait and Station - deferred.   ASSESSMENT/PLAN Bob Elliott is a 78 y.o. male with history of HTN, HLD, OSA, aortic aneurysm, CKD presenting with  transient vision loss affecting his right eye x 5-7 mins.   R amaurosis fugax   Code Stroke CT head No acute abnormality. ASPECTS 10.     MRI  No acute abnormality. Small vessel disease. Atrophy.   MRA  No ELVO. Mild intracranial atherosclerosis   CTA neck negative  Carotid Doppler  B ICA 1-39% stenosis, VAs antegrade   2D Echo EF 60-65%  LDL 113  HgbA1c 5.4  ESR 4  Lovenox 40 mg sq daily for VTE prophylaxis  No antithrombotic prior to admission, now on clopidogrel 75 mg daily. (allergic to ASA). Continue plavix on discharge.   Therapy recommendations:  No therapy needs  Disposition:  Return home  Recommend outpt ophthalmology follow  up  Hypertension  Stable . 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 Please call with questions. Pt will follow up with stroke clinic NP at [facility] in about 4 weeks. Thanks for the consult.  Rosalin Hawking, MD PhD Stroke Neurology 09/20/2019 5:27 PM  To contact Stroke Continuity provider, please refer to [email or web address] After hours, contact General Neurology

## 2019-09-20 NOTE — Discharge Summary (Signed)
Physician Discharge Summary  Bob Elliott ZOX:[id number] DOB: 1941-08-30 DOA: 09/19/2019  PCP: [name], MD  Admit date: 09/19/2019 Discharge date: 09/21/2019  Admitted From: home Disposition:  home  Recommendations for Outpatient Follow-up:  1. Follow up with PCP in 1-2 weeks 2. Please obtain BMP/CBC in one week 3. Please follow up on the following pending results:  Home Health: no  Equipment/Devices:none  Discharge Condition: Stable CODE STATUS: FULL Diet recommendation: Heart Healthy  Brief/Interim Summary:  78 year old Man with hypertension, HLD, ascending aortic  aneurysm, glaucoma, CKD stage II, BPH presented with right eye vision 7:30 PM 09/19/2019 while watching TV, episode lasted about 15 minutes and resolved.  Never had similar symptoms.  Came to the ER for evaluation. In the ER CT head no acute finding, neurology was consulted and was admitted for stroke work-up. Stroke work-up completed with unremarkable MRI/MRA brain, carotid duplex.  Seen by neurology. Plan Plavix and Lipitor.  Has intolerance to aspirin. No further PT OT needs.  Issues addressed Amaurosis fugax right eye: Well-appearing no acute distress, CT angio neck no acute. Carotid- B IC 1-39% stenosis.  LDL not controlled at 113,hbA1c 5.5.  Echocardiogram pending-came back later with no acute findings   lvef 60 to 65%.  His COVID-19 negative.  Discussed with neurology  His eho came back normal and okay to d/c home and needs follow up outpatient neurology and ophthalmology.  Continue Plavix and Lipitor. Other issues are listed as above and chronic and stable including CKD, hypertension, BPH, glaucoma- he will continue on home medications. Discussed with neurology and can go home as esr is normal.  Discharge Diagnoses:  Principal Problem:   Amaurosis fugax Active Problems:   Cerebral thrombosis with cerebral infarction    Discharge Instructions  Discharge Instructions    Ambulatory referral to  Neurology   Complete by: As directed    An appointment is requested in approximately: For stroke follow-up   Diet - low sodium heart healthy   Complete by: As directed    Discharge instructions   Complete by: As directed    Please call call MD or return to ER for similar or worsening recurring problem that brought you to hospital or if any fever,nausea/vomiting,abdominal pain, uncontrolled pain, chest pain,  shortness of breath or any other alarming symptoms.  Please follow-up your doctor as instructed in a week time and call the office for appointment.  Please avoid alcohol, smoking, or any other illicit substance and maintain healthy habits including taking your regular medications as prescribed.  You were cared for by a hospitalist during your hospital stay. If you have any questions about your discharge medications or the care you received while you were in the hospital after you are discharged, you can call the unit and ask to speak with the hospitalist on call if the hospitalist that took care of you is not available.  Once you are discharged, your primary care physician will handle any further medical issues. Please note that NO REFILLS for any discharge medications will be authorized once you are discharged, as it is imperative that you return to your primary care physician (or establish a relationship with a primary care physician if you do not have one) for your aftercare needs so that they can reassess your need for medications and monitor your lab values Follow-up with ophthalmology Follow-up with outpatient and with neurology   Increase activity slowly   Complete by: As directed      Allergies as of 09/20/2019  Reactions   Aspirin Hives, Itching, Nausea Only   Codeine Hives, Itching, Nausea Only   Shellfish Allergy Hives      Medication List    TAKE these medications   amLODipine 10 MG tablet Commonly known as: NORVASC Take 1 tablet (10 mg total) by mouth daily.    atorvastatin 40 MG tablet Commonly known as: LIPITOR Take 1 tablet (40 mg total) by mouth daily at 6 PM.   clopidogrel 75 MG tablet Commonly known as: PLAVIX Take 1 tablet (75 mg total) by mouth daily.   doxazosin 4 MG tablet Commonly known as: CARDURA Take 4 mg by mouth at bedtime.   esomeprazole 40 MG capsule Commonly known as: NEXIUM Take 30-60 min before first meal of the day What changed:   how much to take  how to take this  when to take this   fluticasone 50 MCG/ACT nasal spray Commonly known as: FLONASE Place 2 sprays into both nostrils daily as needed for allergies or rhinitis.   irbesartan 150 MG tablet Commonly known as: AVAPRO Take 1 tablet (150 mg total) by mouth daily. Pt needed a 10 day supply until mail order arrives. Thank you What changed: additional instructions   loratadine 10 MG tablet Commonly known as: CLARITIN Take 10 mg by mouth daily.   potassium chloride 10 MEQ tablet Commonly known as: KLOR-CON Take 2 tablets (20 mEq total) by mouth 3 (three) times daily. What changed:   how much to take  when to take this   spironolactone 25 MG tablet Commonly known as: ALDACTONE Take 0.5 tablets (12.5 mg total) by mouth daily.   timolol 0.5 % ophthalmic solution Commonly known as: TIMOPTIC Place 1 drop into both eyes daily.      Follow-up Information    [name], MD Follow up in 1 week(s).   Specialty: Internal Medicine Contact information: [address] [place] [address] [phone]        Dorothy Spark, MD .   Specialty: Cardiology Contact information: [place] [address] [phone]        [facility]. Schedule an appointment as soon as possible for a visit in 4 week(s).   Specialty: Neurology Contact information: [place] [phone]         Allergies  Allergen Reactions  .  Aspirin Hives, Itching and Nausea Only  . Codeine Hives, Itching and Nausea Only  . Shellfish Allergy Hives    Consultations:  neuro  Procedures/Studies: Ct Angio Neck W Or Wo Contrast  Result Date: 09/20/2019 CLINICAL DATA:  Amaurosis fugax. EXAM: CT ANGIOGRAPHY NECK TECHNIQUE: Multidetector CT imaging of the neck was performed using the standard protocol during bolus administration of intravenous contrast. Multiplanar CT image reconstructions and MIPs were obtained to evaluate the vascular anatomy. Carotid stenosis measurements (when applicable) are obtained utilizing NASCET criteria, using the distal internal carotid diameter as the denominator. CONTRAST:  4m OMNIPAQUE IOHEXOL 350 MG/ML SOLN COMPARISON:  MRI and MRA head 09/20/2019 FINDINGS: Aortic arch: Standard branching. Imaged portion shows no evidence of aneurysm or dissection. No significant stenosis of the major arch vessel origins. Right carotid system: Normal right carotid system. Negative for atherosclerotic disease dissection or stenosis Left carotid system: Negative for stenosis in the left carotid system. Mild noncalcified plaque left carotid bulb. Left occipital artery origin from the proximal internal carotid artery, a variant of normal anatomy. Vertebral arteries: Both vertebral arteries  widely patent without stenosis or dissection. Skeleton: ACDF C4 through C7.  No acute skeletal abnormality. Other neck: No mass or adenopathy in the neck. Upper chest: Lung apices clear bilaterally. IMPRESSION: No significant carotid or vertebral stenosis in the neck. Electronically Signed   By: Franchot Gallo M.D.   On: 09/20/2019 12:49   Mr Angio Head Wo Contrast  Result Date: 09/20/2019 CLINICAL DATA:  Initial evaluation for transient right-sided visual loss. EXAM: MRI HEAD WITHOUT CONTRAST MRA HEAD WITHOUT CONTRAST TECHNIQUE: Multiplanar, multiecho pulse sequences of the brain and surrounding structures were obtained without intravenous  contrast. Angiographic images of the head were obtained using MRA technique without contrast. COMPARISON:  Prior head CT from 09/19/2019. FINDINGS: MRI HEAD FINDINGS Brain: Generalized age-related cerebral atrophy. Mild scattered T2/FLAIR hyperintensities noted within the periventricular, deep, and subcortical white matter both cerebral hemispheres, nonspecific, but most like related chronic micro vessel ischemic disease, mild for age. No abnormal foci of restricted diffusion to suggest acute or subacute ischemia. Gray-white matter differentiation maintained. No encephalomalacia to suggest chronic cortical infarction. No foci of susceptibility artifact to suggest acute or chronic intracranial hemorrhage. No mass lesion, midline shift or mass effect. No hydrocephalus. No extra-axial fluid collection. Pituitary gland normal. Vascular: Major intracranial vascular flow voids are maintained. Skull and upper cervical spine: Craniocervical junction normal. Bone marrow signal intensity within normal limits. No scalp soft tissue abnormality. Sinuses/Orbits: Patient status post ocular lens replacement on the right. Globes and orbital soft tissues demonstrate no acute finding. Paranasal sinuses are largely clear. No significant mastoid effusion. Inner ear structures grossly normal. Other: None. MRA HEAD FINDINGS ANTERIOR CIRCULATION: Distal cervical segments of the internal carotid arteries are patent with symmetric antegrade flow. Petrous, cavernous, and supraclinoid ICAs patent without hemodynamically significant stenosis. Origin of the ophthalmic arteries patent bilaterally. ICA termini well perfused. A1 segments patent bilaterally. Normal anterior communicating artery. Anterior cerebral arteries patent to their distal aspects without stenosis. Left M1 widely patent. Short-segment mild stenosis noted at the mid right M1 segment (series 1050, image 8). Normal MCA bifurcations. Distal MCA branches well perfused and symmetric.  Diffuse small vessel atheromatous irregularity noted bilaterally. POSTERIOR CIRCULATION: Vertebral arteries patent to the vertebrobasilar junction without stenosis. Patent left PICA. Right PICA not seen. Basilar widely patent to its distal aspect without stenosis. Superior cerebral arteries demonstrate atheromatous irregularity but are widely patent bilaterally. Both PCAs primarily supplied via the basilar. Scattered atheromatous irregularity throughout the PCAs without hemodynamically significant stenosis. No intracranial aneurysm. IMPRESSION: MRI HEAD IMPRESSION: 1. No acute intracranial infarct or other abnormality identified. 2. Mild age-related cerebral atrophy with chronic small vessel ischemic disease. MRA HEAD IMPRESSION: 1. Negative intracranial MRA for large vessel occlusion. 2. Mild intracranial atherosclerotic change for age. No hemodynamically significant or correctable stenosis identified. Electronically Signed   By: Jeannine Boga M.D.   On: 09/20/2019 02:41   Mr Brain Wo Contrast  Result Date: 09/20/2019 CLINICAL DATA:  Initial evaluation for transient right-sided visual loss. EXAM: MRI HEAD WITHOUT CONTRAST MRA HEAD WITHOUT CONTRAST TECHNIQUE: Multiplanar, multiecho pulse sequences of the brain and surrounding structures were obtained without intravenous contrast. Angiographic images of the head were obtained using MRA technique without contrast. COMPARISON:  Prior head CT from 09/19/2019. FINDINGS: MRI HEAD FINDINGS Brain: Generalized age-related cerebral atrophy. Mild scattered T2/FLAIR hyperintensities noted within the periventricular, deep, and subcortical white matter both cerebral hemispheres, nonspecific, but most like related chronic micro vessel ischemic disease, mild for age. No abnormal foci of restricted diffusion to suggest acute  or subacute ischemia. Gray-white matter differentiation maintained. No encephalomalacia to suggest chronic cortical infarction. No foci of  susceptibility artifact to suggest acute or chronic intracranial hemorrhage. No mass lesion, midline shift or mass effect. No hydrocephalus. No extra-axial fluid collection. Pituitary gland normal. Vascular: Major intracranial vascular flow voids are maintained. Skull and upper cervical spine: Craniocervical junction normal. Bone marrow signal intensity within normal limits. No scalp soft tissue abnormality. Sinuses/Orbits: Patient status post ocular lens replacement on the right. Globes and orbital soft tissues demonstrate no acute finding. Paranasal sinuses are largely clear. No significant mastoid effusion. Inner ear structures grossly normal. Other: None. MRA HEAD FINDINGS ANTERIOR CIRCULATION: Distal cervical segments of the internal carotid arteries are patent with symmetric antegrade flow. Petrous, cavernous, and supraclinoid ICAs patent without hemodynamically significant stenosis. Origin of the ophthalmic arteries patent bilaterally. ICA termini well perfused. A1 segments patent bilaterally. Normal anterior communicating artery. Anterior cerebral arteries patent to their distal aspects without stenosis. Left M1 widely patent. Short-segment mild stenosis noted at the mid right M1 segment (series 1050, image 8). Normal MCA bifurcations. Distal MCA branches well perfused and symmetric. Diffuse small vessel atheromatous irregularity noted bilaterally. POSTERIOR CIRCULATION: Vertebral arteries patent to the vertebrobasilar junction without stenosis. Patent left PICA. Right PICA not seen. Basilar widely patent to its distal aspect without stenosis. Superior cerebral arteries demonstrate atheromatous irregularity but are widely patent bilaterally. Both PCAs primarily supplied via the basilar. Scattered atheromatous irregularity throughout the PCAs without hemodynamically significant stenosis. No intracranial aneurysm. IMPRESSION: MRI HEAD IMPRESSION: 1. No acute intracranial infarct or other abnormality  identified. 2. Mild age-related cerebral atrophy with chronic small vessel ischemic disease. MRA HEAD IMPRESSION: 1. Negative intracranial MRA for large vessel occlusion. 2. Mild intracranial atherosclerotic change for age. No hemodynamically significant or correctable stenosis identified. Electronically Signed   By: Jeannine Boga M.D.   On: 09/20/2019 02:41   Ct Head Code Stroke Wo Contrast  Result Date: 09/19/2019 CLINICAL DATA:  Code stroke.  Vision loss. EXAM: CT HEAD WITHOUT CONTRAST TECHNIQUE: Contiguous axial images were obtained from the base of the skull through the vertex without intravenous contrast. COMPARISON:  Head MRI 02/27/2004 FINDINGS: Brain: An approximately 1.7 cm region of hyperattenuation in the region of the right lentiform nucleus and internal capsule is without edema or mass effect and is most compatible with calcification related to the developmental venous anomaly in this region demonstrated on prior MRI. There is no evidence of acute infarct, intracranial hemorrhage, mass, midline shift, or extra-axial fluid collection. The ventricles and sulci are within normal limits for age. Vascular: Calcified atherosclerosis at the skull base. 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 Skull: No fracture or focal osseous lesion. Sinuses/Orbits: Visualized paranasal sinuses and mastoid air cells are clear. Right cataract extraction is noted. Other: None. ASPECTS [facility] Stroke Program Early CT Score) - Ganglionic level infarction (caudate, lentiform nuclei, internal capsule, insula, M1-M3 cortex): 7 - Supraganglionic infarction (M4-M6 cortex): 3 Total score (0-10 with 10 being normal): 10 IMPRESSION: 1. No evidence of acute intracranial abnormality. 2. ASPECTS is 10. 3. 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 These results were communicated to Dr. Cheral Marker at 9:17 pm on 09/19/2019 by text page via the [facility] messaging system. Electronically  Signed   By: Logan Bores M.D.   On: 09/19/2019 21:18   Vas US Carotid (at [place] Only)  Result Date: 09/20/2019 Carotid Arterial Duplex Study Indications:       CVA. Risk Factors:  Hypertension, hyperlipidemia. Comparison Study:  no prior Performing Technologist: Abram Sander RVS  Examination Guidelines: A complete evaluation includes B-mode imaging, spectral Doppler, color Doppler, and power Doppler as needed of all accessible portions of each vessel. Bilateral testing is considered an integral part of a complete examination. Limited examinations for reoccurring indications may be performed as noted.  Right Carotid Findings: +----------+--------+--------+--------+------------------+--------+           PSV cm/sEDV cm/sStenosisPlaque DescriptionComments +----------+--------+--------+--------+------------------+--------+ CCA Prox  74      14              heterogenous               +----------+--------+--------+--------+------------------+--------+ CCA Distal49      16              heterogenous               +----------+--------+--------+--------+------------------+--------+ ICA Prox  34      8       1-39%   heterogenous               +----------+--------+--------+--------+------------------+--------+ ICA Distal55      18                                         +----------+--------+--------+--------+------------------+--------+ ECA       71      11                                         +----------+--------+--------+--------+------------------+--------+ +----------+--------+-------+--------+-------------------+           PSV cm/sEDV cmsDescribeArm Pressure (mmHG) +----------+--------+-------+--------+-------------------+ Subclavian105                                        +----------+--------+-------+--------+-------------------+ +---------+--------+--+--------+-+---------+ VertebralPSV cm/s34EDV cm/s9Antegrade  +---------+--------+--+--------+-+---------+  Left Carotid Findings: +----------+--------+--------+--------+------------------+--------+           PSV cm/sEDV cm/sStenosisPlaque DescriptionComments +----------+--------+--------+--------+------------------+--------+ CCA Prox  113     22              heterogenous               +----------+--------+--------+--------+------------------+--------+ CCA Distal82      16              heterogenous               +----------+--------+--------+--------+------------------+--------+ ICA Prox  40      13      1-39%   heterogenous               +----------+--------+--------+--------+------------------+--------+ ICA Distal42      18                                         +----------+--------+--------+--------+------------------+--------+ ECA       73      9                                          +----------+--------+--------+--------+------------------+--------+ +----------+--------+--------+--------+-------------------+  PSV cm/sEDV cm/sDescribeArm Pressure (mmHG) +----------+--------+--------+--------+-------------------+ [id number]                                          +----------+--------+--------+--------+-------------------+ +---------+--------+--+--------+--+ VertebralPSV cm/s34EDV cm/s10 +---------+--------+--+--------+--+  Summary: Right Carotid: Velocities in the right ICA are consistent with a 1-39% stenosis. Left Carotid: Velocities in the left ICA are consistent with a 1-39% stenosis. Vertebrals: Bilateral vertebral arteries demonstrate antegrade flow. *See table(s) above for measurements and observations.     Preliminary       Subjective: No new complaints. Feeling well. Wants to go home later  Discharge Exam: Vitals:   09/20/19 1700 09/20/19 1800  BP: 131/75 (!) 148/80  Pulse:  67  Resp: 19 20  Temp:    SpO2: 96% 96%   Vitals:   09/20/19 1500 09/20/19 1600 09/20/19 1700  09/20/19 1800  BP: 122/73 134/85 131/75 (!) 148/80  Pulse: 66   67  Resp: 19 (!) _0 Temp:      TempSrc:      SpO2: 96% 97% 96% 96%  Weight:        General: Pt is alert, awake, not in acute distress Cardiovascular: RRR, S1/S2 +, no rubs, no gallops Respiratory: CTA bilaterally, no wheezing, no rhonchi Abdominal: Soft, NT, ND, bowel sounds + Extremities: no edema, no cyanosis   The results of significant diagnostics from this hospitalization (including imaging, microbiology, ancillary and laboratory) are listed below for reference.     Microbiology: Recent Results (from the past 240 hour(s))  SARS CORONAVIRUS 2 (TAT 6-24 HRS) Nasopharyngeal Nasopharyngeal Swab     Status: None   Collection Time: 09/19/19 11:24 PM   Specimen: Nasopharyngeal Swab  Result Value Ref Range Status   SARS Coronavirus 2 NEGATIVE NEGATIVE Final    Comment: (NOTE) SARS-CoV-2 target nucleic acids are NOT DETECTED. The SARS-CoV-2 RNA is generally detectable in upper and lower respiratory specimens during the acute phase of infection. Negative results do not preclude SARS-CoV-2 infection, do not rule out co-infections with other pathogens, and should not be used as the sole basis for treatment or other patient management decisions. Negative results must be combined with clinical observations, patient history, and epidemiological information. The expected result is Negative. Fact Sheet for Patients: SugarRoll.be Fact Sheet for Healthcare Providers: https://www.woods-mathews.com/ This test is not yet approved or cleared by the [place] FDA and  has been authorized for detection and/or diagnosis of SARS-CoV-2 by FDA under an Emergency Use Authorization (EUA). This EUA will remain  in effect (meaning this test can be used) for the duration of the COVID-19 declaration under Section 56 4(b)(1) of the Act, 21 U.S.C. section 360bbb-3(b)(1), unless the  authorization is terminated or revoked sooner. Performed at [facility], [place] [address] [place], [place] [address]      Labs: BNP (last 3 results) No results for input(s): BNP in the last 8760 hours. Basic Metabolic Panel: Recent Labs  Lab 09/19/19 2046 09/19/19 2101  NA 139 141  K 4.0 4.0  CL 106 102  CO2 25  --   GLUCOSE 112* 108*  BUN 14 15  CREATININE 1.34* 1.40*  CALCIUM 9.2  --    Liver Function Tests: Recent Labs  Lab 09/19/19 2046  AST 16  ALT 16  ALKPHOS 57  BILITOT 0.9  PROT 6.7  ALBUMIN 3.8   No results for input(s): LIPASE, AMYLASE  in the last 168 hours. No results for input(s): AMMONIA in the last 168 hours. CBC: Recent Labs  Lab 09/19/19 2046 09/19/19 2101  WBC 6.3  --   NEUTROABS 4.0  --   HGB 15.2 15.0  HCT 45.4 44.0  MCV 91.9  --   PLT 196  --    Cardiac Enzymes: No results for input(s): CKTOTAL, CKMB, CKMBINDEX, TROPONINI in the last 168 hours. BNP: Invalid input(s): POCBNP CBG: Recent Labs  Lab 09/19/19 2050  GLUCAP 105*   D-Dimer No results for input(s): DDIMER in the last 72 hours. Hgb A1c Recent Labs    09/20/19 0740  HGBA1C 5.4   Lipid Profile Recent Labs    09/20/19 0740  CHOL 176  HDL 44  LDLCALC 113*  TRIG 96  CHOLHDL 4.0   Thyroid function studies No results for input(s): TSH, T4TOTAL, T3FREE, THYROIDAB in the last 72 hours.  Invalid input(s): FREET3 Anemia work up No results for input(s): VITAMINB12, FOLATE, FERRITIN, TIBC, IRON, RETICCTPCT in the last 72 hours. Urinalysis    Component Value Date/Time   COLORURINE YELLOW 03/09/2011 1630   APPEARANCEUR CLEAR 03/09/2011 1630   LABSPEC 1.011 03/09/2011 1630   PHURINE 7.0 03/09/2011 1630   GLUCOSEU NEGATIVE 03/09/2011 1630   HGBUR NEGATIVE 03/09/2011 1630   BILIRUBINUR NEGATIVE 03/09/2011 1630   KETONESUR NEGATIVE 03/09/2011 1630   PROTEINUR NEGATIVE 03/09/2011 1630   UROBILINOGEN 0.2 03/09/2011 1630   NITRITE NEGATIVE 03/09/2011 1630    LEUKOCYTESUR  03/09/2011 1630    NEGATIVE MICROSCOPIC NOT DONE ON URINES WITH NEGATIVE PROTEIN, BLOOD, LEUKOCYTES, NITRITE, OR GLUCOSE <1000 mg/dL.   Sepsis Labs Invalid input(s): PROCALCITONIN,  WBC,  LACTICIDVEN Microbiology Recent Results (from the past 240 hour(s))  SARS CORONAVIRUS 2 (TAT 6-24 HRS) Nasopharyngeal Nasopharyngeal Swab     Status: None   Collection Time: 09/19/19 11:24 PM   Specimen: Nasopharyngeal Swab  Result Value Ref Range Status   SARS Coronavirus 2 NEGATIVE NEGATIVE Final    Comment: (NOTE) SARS-CoV-2 target nucleic acids are NOT DETECTED. The SARS-CoV-2 RNA is generally detectable in upper and lower respiratory specimens during the acute phase of infection. Negative results do not preclude SARS-CoV-2 infection, do not rule out co-infections with other pathogens, and should not be used as the sole basis for treatment or other patient management decisions. Negative results must be combined with clinical observations, patient history, and epidemiological information. The expected result is Negative. Fact Sheet for Patients: SugarRoll.be Fact Sheet for Healthcare Providers: https://www.woods-mathews.com/ This test is not yet approved or cleared by the [place] FDA and  has been authorized for detection and/or diagnosis of SARS-CoV-2 by FDA under an Emergency Use Authorization (EUA). This EUA will remain  in effect (meaning this test can be used) for the duration of the COVID-19 declaration under Section 56 4(b)(1) of the Act, 21 U.S.C. section 360bbb-3(b)(1), unless the authorization is terminated or revoked sooner. Performed at [facility], [place] [address] [place], [place] [address]      Time coordinating discharge: 25 minutes  SIGNED:   Antonieta Pert, MD  [facility] 09/21/2019, 3:55 PM  If 7PM-7AM, please contact night-coverage [email or web address]

## 2019-09-20 NOTE — ED Notes (Signed)
Patient transported to Ct Scan

## 2019-09-20 NOTE — ED Notes (Signed)
Spoke with daughter of patient Faythe Dingwall [phone] explained plan of care. Also notified admit doctor to call daughter for an update.

## 2019-09-20 NOTE — ED Notes (Signed)
Patient calling for a ride.

## 2019-09-20 NOTE — ED Notes (Signed)
Doctor at bedside.

## 2019-09-20 NOTE — ED Notes (Signed)
Spoke with Dr Erlinda Hong discontinued C reactive protein and able to be discharged SED RATE 4.

## 2019-09-20 NOTE — H&P (Signed)
History and Physical    JARIK BORROMEO [id number] DOB: 07/09/1941 DOA: 09/19/2019  PCP: [name], MD Patient coming from: Home  Chief Complaint: Right eye vision loss  HPI: ZAKYE ARNALL is a 78 y.o. male with medical history significant of hypertension, hyperlipidemia, thoracic aortic aneurysm, glaucoma, CKD stage II, BPH presenting with complaints of right eye vision loss. Patient states this evening around 7:30 PM he was watching TV and noticed that the upper half of his vision turned dark in his right eye only.  He could still see some colors from the lower half of his visual field.  His left eye was not affected.  Finally after 15 minutes his vision went back to normal.  States he has never had a similar episode before.  Denies history of stroke.  He did not have any weakness in his extremities or numbness anywhere.  He did not have any difficulty speaking.  Reports having right-sided frontal headaches for several weeks.  ED Course: Head CT negative for acute finding.  Neurology consulted and recommended stroke work-up.  Review of Systems:  All systems reviewed and apart from history of presenting illness, are negative.  Past Medical History:  Diagnosis Date   Arthritis    BPH (benign prostatic hyperplasia)    CKD (chronic kidney disease), stage II    a. CKD II-III by labs.   Dizziness 10/21/2016   Esophageal dysmotility    Essential hypertension    Trial off acei 08/12/2017 for unexplained sob/ noct smothering    GERD (gastroesophageal reflux disease)    GI bleed    Glaucoma    BOTH EYES   Hyperlipidemia    Hypertension    Inguinal hernia without mention of obstruction or gangrene, unilateral or unspecified, (not specified as recurrent)-right s/p repair with mesh 05/21/2014   Schatzki's ring    Sinus bradycardia    a. prompting dc of Diltiazem (?chronotropic incompetence) in 2017.   Sleep apnea    "mild" no cpap   Thoracic aortic aneurysm (TAA) ([place])       Past Surgical History:  Procedure Laterality Date   COLONOSCOPY WITH PROPOFOL N/A 05/11/2016   Procedure: COLONOSCOPY WITH PROPOFOL;  Surgeon: Garlan Fair, MD;  Location: [facility];  Service: Endoscopy;  Laterality: N/A;   EYE SURGERY     CATARACT R  EYE   HERNIA REPAIR     UMBILICAL   INGUINAL HERNIA REPAIR Right 06/18/2014   Procedure: RIGHT INGUINAL HERNIA REPAIR;  Surgeon: Odis Hollingshead, MD;  Location: [facility];  Service: General;  Laterality: Right;   INSERTION OF MESH Right 06/18/2014   Procedure: INSERTION OF MESH;  Surgeon: Odis Hollingshead, MD;  Location: [facility];  Service: General;  Laterality: Right;   NECK SURGERY  2000   cervical disc surgery(limited ROM)- retained hardware   TONSILLECTOMY       reports that he has never smoked. He has never used smokeless tobacco. He reports current alcohol use. He reports that he does not use drugs.  Allergies  Allergen Reactions   Aspirin Hives, Itching and Nausea Only   Codeine Hives, Itching and Nausea Only   Shellfish Allergy Hives    Family History  Problem Relation Age of Onset   Heart disease Mother    Stroke Father    Cancer Brother        colon   Diabetes Sister    Leukemia Maternal Grandmother     Prior to Admission medications   Medication  Sig Start Date End Date Taking? Authorizing Provider  amLODipine (NORVASC) 10 MG tablet Take 1 tablet (10 mg total) by mouth daily. 11/02/18   Dorothy Spark, MD  doxazosin (CARDURA) 4 MG tablet Take 4 mg by mouth at bedtime.  02/12/14   [provider]  esomeprazole (NEXIUM) 40 MG capsule Take 30-60 min before first meal of the day 08/12/17   Tanda Rockers, MD  fluticasone [facility]) 50 MCG/ACT nasal spray Place 2 sprays into both nostrils daily as needed for allergies or rhinitis.  03/28/14   [provider]  irbesartan (AVAPRO) 150 MG tablet Take 1 tablet (150 mg total) by mouth daily. Pt needed a 10 day supply until mail order  arrives. Thank you 08/06/19   Dorothy Spark, MD  loratadine (CLARITIN) 10 MG tablet Take 10 mg by mouth daily.    [provider]  potassium chloride (K-DUR) 10 MEQ tablet Take 2 tablets (20 mEq total) by mouth 3 (three) times daily. 11/02/18   Dorothy Spark, MD  spironolactone (ALDACTONE) 25 MG tablet Take 0.5 tablets (12.5 mg total) by mouth daily. 11/02/18   Dorothy Spark, MD  timolol (TIMOPTIC) 0.5 % ophthalmic solution Place 1 drop into both eyes daily.  11/25/17   [provider]    Physical Exam: Vitals:   09/20/19 0045 09/20/19 0100 09/20/19 0115 09/20/19 0330  BP: (!) 142/70 (!) 163/83 (!) 148/77 (!) 148/71  Pulse: 63 64 63   Resp: (!) 21 20 (!) 21 (!) 24  Temp:      TempSrc:      SpO2: 94% 96% 95%   Weight:        Physical Exam  Constitutional: He is oriented to person, place, and time. He appears well-developed and well-nourished. No distress.  HENT:  Head: Normocephalic.  Mouth/Throat: Oropharynx is clear and moist.  Eyes: Pupils are equal, round, and reactive to light. EOM are normal.  Neck: Neck supple.  Cardiovascular: Normal rate, regular rhythm and intact distal pulses.  Pulmonary/Chest: Effort normal and breath sounds normal. No respiratory distress. He has no wheezes. He has no rales.  Abdominal: Soft. Bowel sounds are normal. He exhibits no distension. There is no abdominal tenderness. There is no guarding.  Musculoskeletal:        General: No edema.  Neurological: He is alert and oriented to person, place, and time. No cranial nerve deficit.  Speech fluent, tongue midline, no facial droop Strength 5 out of 5 in bilateral upper and lower extremities Sensation to light touch intact throughout  Skin: Skin is warm and dry. He is not diaphoretic.     Labs on Admission: I have personally reviewed following labs and imaging studies  CBC: Recent Labs  Lab 09/19/19 2046 09/19/19 2101  WBC 6.3  --   NEUTROABS 4.0  --   HGB 15.2  15.0  HCT 45.4 44.0  MCV 91.9  --   PLT 196  --    Basic Metabolic Panel: Recent Labs  Lab 09/19/19 2046 09/19/19 2101  NA 139 141  K 4.0 4.0  CL 106 102  CO2 25  --   GLUCOSE 112* 108*  BUN 14 15  CREATININE 1.34* 1.40*  CALCIUM 9.2  --    GFR: Estimated Creatinine Clearance: 48.4 mL/min (A) (by C-G formula based on SCr of 1.4 mg/dL (H)). Liver Function Tests: Recent Labs  Lab 09/19/19 2046  AST 16  ALT 16  ALKPHOS 57  BILITOT 0.9  PROT  6.7  ALBUMIN 3.8   No results for input(s): LIPASE, AMYLASE in the last 168 hours. No results for input(s): AMMONIA in the last 168 hours. Coagulation Profile: Recent Labs  Lab 09/19/19 2046  INR 1.0   Cardiac Enzymes: No results for input(s): CKTOTAL, CKMB, CKMBINDEX, TROPONINI in the last 168 hours. BNP (last 3 results) No results for input(s): PROBNP in the last 8760 hours. HbA1C: No results for input(s): HGBA1C in the last 72 hours. CBG: Recent Labs  Lab 09/19/19 2050  GLUCAP 105*   Lipid Profile: No results for input(s): CHOL, HDL, LDLCALC, TRIG, CHOLHDL, LDLDIRECT in the last 72 hours. Thyroid Function Tests: No results for input(s): TSH, T4TOTAL, FREET4, T3FREE, THYROIDAB in the last 72 hours. Anemia Panel: No results for input(s): VITAMINB12, FOLATE, FERRITIN, TIBC, IRON, RETICCTPCT in the last 72 hours. Urine analysis:    Component Value Date/Time   COLORURINE YELLOW 03/09/2011 1630   APPEARANCEUR CLEAR 03/09/2011 1630   LABSPEC 1.011 03/09/2011 1630   PHURINE 7.0 03/09/2011 1630   GLUCOSEU NEGATIVE 03/09/2011 1630   HGBUR NEGATIVE 03/09/2011 1630   BILIRUBINUR NEGATIVE 03/09/2011 1630   KETONESUR NEGATIVE 03/09/2011 1630   PROTEINUR NEGATIVE 03/09/2011 1630   UROBILINOGEN 0.2 03/09/2011 1630   NITRITE NEGATIVE 03/09/2011 1630   LEUKOCYTESUR  03/09/2011 1630    NEGATIVE MICROSCOPIC NOT DONE ON URINES WITH NEGATIVE PROTEIN, BLOOD, LEUKOCYTES, NITRITE, OR GLUCOSE <1000 mg/dL.    Radiological Exams on  Admission: Mr Angio Head Wo Contrast  Result Date: 09/20/2019 CLINICAL DATA:  Initial evaluation for transient right-sided visual loss. EXAM: MRI HEAD WITHOUT CONTRAST MRA HEAD WITHOUT CONTRAST TECHNIQUE: Multiplanar, multiecho pulse sequences of the brain and surrounding structures were obtained without intravenous contrast. Angiographic images of the head were obtained using MRA technique without contrast. COMPARISON:  Prior head CT from 09/19/2019. FINDINGS: MRI HEAD FINDINGS Brain: Generalized age-related cerebral atrophy. Mild scattered T2/FLAIR hyperintensities noted within the periventricular, deep, and subcortical white matter both cerebral hemispheres, nonspecific, but most like related chronic micro vessel ischemic disease, mild for age. No abnormal foci of restricted diffusion to suggest acute or subacute ischemia. Gray-white matter differentiation maintained. No encephalomalacia to suggest chronic cortical infarction. No foci of susceptibility artifact to suggest acute or chronic intracranial hemorrhage. No mass lesion, midline shift or mass effect. No hydrocephalus. No extra-axial fluid collection. Pituitary gland normal. Vascular: Major intracranial vascular flow voids are maintained. Skull and upper cervical spine: Craniocervical junction normal. Bone marrow signal intensity within normal limits. No scalp soft tissue abnormality. Sinuses/Orbits: Patient status post ocular lens replacement on the right. Globes and orbital soft tissues demonstrate no acute finding. Paranasal sinuses are largely clear. No significant mastoid effusion. Inner ear structures grossly normal. Other: None. MRA HEAD FINDINGS ANTERIOR CIRCULATION: Distal cervical segments of the internal carotid arteries are patent with symmetric antegrade flow. Petrous, cavernous, and supraclinoid ICAs patent without hemodynamically significant stenosis. Origin of the ophthalmic arteries patent bilaterally. ICA termini well perfused. A1  segments patent bilaterally. Normal anterior communicating artery. Anterior cerebral arteries patent to their distal aspects without stenosis. Left M1 widely patent. Short-segment mild stenosis noted at the mid right M1 segment (series 1050, image 8). Normal MCA bifurcations. Distal MCA branches well perfused and symmetric. Diffuse small vessel atheromatous irregularity noted bilaterally. POSTERIOR CIRCULATION: Vertebral arteries patent to the vertebrobasilar junction without stenosis. Patent left PICA. Right PICA not seen. Basilar widely patent to its distal aspect without stenosis. Superior cerebral arteries demonstrate atheromatous irregularity but are widely patent bilaterally. Both PCAs  primarily supplied via the basilar. Scattered atheromatous irregularity throughout the PCAs without hemodynamically significant stenosis. No intracranial aneurysm. IMPRESSION: MRI HEAD IMPRESSION: 1. No acute intracranial infarct or other abnormality identified. 2. Mild age-related cerebral atrophy with chronic small vessel ischemic disease. MRA HEAD IMPRESSION: 1. Negative intracranial MRA for large vessel occlusion. 2. Mild intracranial atherosclerotic change for age. No hemodynamically significant or correctable stenosis identified. Electronically Signed   By: Jeannine Boga M.D.   On: 09/20/2019 02:41   Mr Brain Wo Contrast  Result Date: 09/20/2019 CLINICAL DATA:  Initial evaluation for transient right-sided visual loss. EXAM: MRI HEAD WITHOUT CONTRAST MRA HEAD WITHOUT CONTRAST TECHNIQUE: Multiplanar, multiecho pulse sequences of the brain and surrounding structures were obtained without intravenous contrast. Angiographic images of the head were obtained using MRA technique without contrast. COMPARISON:  Prior head CT from 09/19/2019. FINDINGS: MRI HEAD FINDINGS Brain: Generalized age-related cerebral atrophy. Mild scattered T2/FLAIR hyperintensities noted within the periventricular, deep, and subcortical white  matter both cerebral hemispheres, nonspecific, but most like related chronic micro vessel ischemic disease, mild for age. No abnormal foci of restricted diffusion to suggest acute or subacute ischemia. Gray-white matter differentiation maintained. No encephalomalacia to suggest chronic cortical infarction. No foci of susceptibility artifact to suggest acute or chronic intracranial hemorrhage. No mass lesion, midline shift or mass effect. No hydrocephalus. No extra-axial fluid collection. Pituitary gland normal. Vascular: Major intracranial vascular flow voids are maintained. Skull and upper cervical spine: Craniocervical junction normal. Bone marrow signal intensity within normal limits. No scalp soft tissue abnormality. Sinuses/Orbits: Patient status post ocular lens replacement on the right. Globes and orbital soft tissues demonstrate no acute finding. Paranasal sinuses are largely clear. No significant mastoid effusion. Inner ear structures grossly normal. Other: None. MRA HEAD FINDINGS ANTERIOR CIRCULATION: Distal cervical segments of the internal carotid arteries are patent with symmetric antegrade flow. Petrous, cavernous, and supraclinoid ICAs patent without hemodynamically significant stenosis. Origin of the ophthalmic arteries patent bilaterally. ICA termini well perfused. A1 segments patent bilaterally. Normal anterior communicating artery. Anterior cerebral arteries patent to their distal aspects without stenosis. Left M1 widely patent. Short-segment mild stenosis noted at the mid right M1 segment (series 1050, image 8). Normal MCA bifurcations. Distal MCA branches well perfused and symmetric. Diffuse small vessel atheromatous irregularity noted bilaterally. POSTERIOR CIRCULATION: Vertebral arteries patent to the vertebrobasilar junction without stenosis. Patent left PICA. Right PICA not seen. Basilar widely patent to its distal aspect without stenosis. Superior cerebral arteries demonstrate atheromatous  irregularity but are widely patent bilaterally. Both PCAs primarily supplied via the basilar. Scattered atheromatous irregularity throughout the PCAs without hemodynamically significant stenosis. No intracranial aneurysm. IMPRESSION: MRI HEAD IMPRESSION: 1. No acute intracranial infarct or other abnormality identified. 2. Mild age-related cerebral atrophy with chronic small vessel ischemic disease. MRA HEAD IMPRESSION: 1. Negative intracranial MRA for large vessel occlusion. 2. Mild intracranial atherosclerotic change for age. No hemodynamically significant or correctable stenosis identified. Electronically Signed   By: Jeannine Boga M.D.   On: 09/20/2019 02:41   Ct Head Code Stroke Wo Contrast  Result Date: 09/19/2019 CLINICAL DATA:  Code stroke.  Vision loss. EXAM: CT HEAD WITHOUT CONTRAST TECHNIQUE: Contiguous axial images were obtained from the base of the skull through the vertex without intravenous contrast. COMPARISON:  Head MRI 02/27/2004 FINDINGS: Brain: An approximately 1.7 cm region of hyperattenuation in the region of the right lentiform nucleus and internal capsule is without edema or mass effect and is most compatible with calcification related to the developmental venous anomaly  in this region demonstrated on prior MRI. There is no evidence of acute infarct, intracranial hemorrhage, mass, midline shift, or extra-axial fluid collection. The ventricles and sulci are within normal limits for age. Vascular: Calcified atherosclerosis at the skull base. 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 Skull: No fracture or focal osseous lesion. Sinuses/Orbits: Visualized paranasal sinuses and mastoid air cells are clear. Right cataract extraction is noted. Other: None. ASPECTS [facility] Stroke Program Early CT Score) - Ganglionic level infarction (caudate, lentiform nuclei, internal capsule, insula, M1-M3 cortex): 7 - Supraganglionic infarction (M4-M6 cortex): 3 Total score (0-10 with 10  being normal): 10 IMPRESSION: 1. No evidence of acute intracranial abnormality. 2. ASPECTS is 10. 3. 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 These results were communicated to Dr. Cheral Marker at 9:17 pm on 09/19/2019 by text page via the [facility] messaging system. Electronically Signed   By: Logan Bores M.D.   On: 09/19/2019 21:18    EKG: Independently reviewed.  Sinus rhythm with first-degree AV block.  No significant change since prior tracing.  Assessment/Plan Principal Problem:   Amaurosis fugax   Amaurosis fugax on the right Seen by neurology and etiology is thought to be microembolic from right ICA plaque versus cardiac source.  Recommended admission for stroke work-up given risk factors. -Telemetry monitoring -Allow for permissive hypertension-treat only if systolic blood pressures are greater than 220. -MRI, MRA of brain without contrast -Carotid Dopplers -2D echocardiogram -Hemoglobin A1c, fasting lipid panel -History of aspirin intolerance.  Start Plavix 75 mg daily. -Possible initiation of statin pending results of stroke work-up -Frequent neurochecks -PT, OT, speech therapy. -N.p.o. until cleared by bedside swallow evaluation or formal speech evaluation -Risk factor modification  DVT prophylaxis: Lovenox Code Status: Full code.  Discussed with the patient. Family Communication: No family available. Disposition Plan: Anticipate discharge after clinical improvement. Consults called: Neurology Admission status: It is my clinical opinion that referral for OBSERVATION is reasonable and necessary in this patient based on the above information provided. The aforementioned taken together are felt to place the patient at high risk for further clinical deterioration. However it is anticipated that the patient may be medically stable for discharge from the hospital within 24 to 48 hours.  The medical decision making on this patient was of high complexity  and the patient is at high risk for clinical deterioration, therefore this is a level 3 visit.  Shela Leff MD [facility] Pager [phone]  If 7PM-7AM, please contact night-coverage [email or web address] Password [email or web address]  09/20/2019, 4:32 AM

## 2019-09-20 NOTE — Progress Notes (Signed)
chocardiogram 2D Echocardiogram has been performed.  Bob Elliott 09/20/2019, 2:56 PM

## 2019-09-20 NOTE — ED Notes (Signed)
Spoke with admit doctor stated will be discharging patient from the ED after ECHO completed. Notified ECHO  Has been completed.

## 2019-09-21 DIAGNOSIS — G453 Amaurosis fugax: Secondary | ICD-10-CM | POA: Diagnosis not present

## 2019-09-25 ENCOUNTER — Ambulatory Visit (INDEPENDENT_AMBULATORY_CARE_PROVIDER_SITE_OTHER): Payer: Medicare Other | Admitting: Physician Assistant

## 2019-09-25 ENCOUNTER — Encounter: Payer: Self-pay | Admitting: Physician Assistant

## 2019-09-25 ENCOUNTER — Other Ambulatory Visit: Payer: Self-pay

## 2019-09-25 VITALS — BP 132/72 | HR 62 | Ht 68.0 in | Wt 212.0 lb

## 2019-09-25 DIAGNOSIS — G453 Amaurosis fugax: Secondary | ICD-10-CM | POA: Diagnosis not present

## 2019-09-25 DIAGNOSIS — N182 Chronic kidney disease, stage 2 (mild): Secondary | ICD-10-CM

## 2019-09-25 DIAGNOSIS — E785 Hyperlipidemia, unspecified: Secondary | ICD-10-CM | POA: Diagnosis not present

## 2019-09-25 DIAGNOSIS — R001 Bradycardia, unspecified: Secondary | ICD-10-CM

## 2019-09-25 DIAGNOSIS — I712 Thoracic aortic aneurysm, without rupture, unspecified: Secondary | ICD-10-CM

## 2019-09-25 DIAGNOSIS — I10 Essential (primary) hypertension: Secondary | ICD-10-CM

## 2019-09-25 NOTE — Progress Notes (Signed)
Cardiology Office Note:    Date:  09/25/2019   ID:  Bob Elliott, DOB [birth year], MRN [id number]  PCP:  [name], MD  Cardiologist:  [name], MD  Electrophysiologist:  None   Referring MD: [name], MD   Chief Complaint  Patient presents with  . Hospitalization Follow-up    Admitted with amaurosis fugax    History of Present Illness:    Bob Elliott is a 78 y.o. 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  He was admitted 11/4-11/6 with transient right eye vision loss (amaurosis fugax).  MRI/MRA, head/neck CTA and carotid Dopplers were unremarkable.  He was followed by neurology.  Antiplatelet therapy (Clopidogrel) was recommended as well as atorvastatin.  Outpatient ophthalmology follow-up was also recommended.  He returns for follow-up.  He is here alone.  He has seen his ophthalmologist.  He is not certain about the findings obtained by the ophthalmologist.  He sees neurology in early December.  He has not had any further eye symptoms.  He has not had chest pain.  He has chronic shortness of breath that is unchanged.  He has not had orthopnea, lower extremity swelling, syncope.  He was skeptical about taking clopidogrel as well as atorvastatin until he can follow-up here.   Prior CV studies:   The following studies were reviewed today:  Echocardiogram 09/20/2019 EF 60-65, mild LVH, Gr 1 DD, no RWMA, mild AI, Ao root 41 mm  Carotid US 09/20/2019 Summary: Right Carotid: Velocities in the right ICA are consistent with a 1-39% stenosis. Left  Carotid: Velocities in the left ICA are consistent with a 1-39% stenosis. Vertebrals: Bilateral vertebral arteries demonstrate antegrade flow.  Chest CTA 03/26/2019 IMPRESSION: 1. No significant change in caliber of the thoracic aorta, measuring approximately 4.1 x 4.0 cm in the tubular ascending thoracic aorta, previously 4.3 x 4.2 cm when measured similarly. The aortic root is normal in caliber. The sinuses of Valsalva are enlarged up to 4.3 cm. The distal aortic arch and descending thoracic aorta are normal in caliber. Minimal atherosclerosis.  2. Diffuse bilateral bronchial wall thickening, consistent with nonspecific infectious or inflammatory bronchitis.  Myoview 09/06/16  Nuclear stress EF: 53%.  Blood pressure demonstrated a normal response to exercise.  There was no ST segment deviation noted during stress.  This is a low risk study.  The left ventricular ejection fraction is mildly decreased (45-54%).  Inferior and apical thinning consistent with soft tissue attenuaion (diaphragm), cannot exclude small subendocardial scar No ichemia  Echocardiogram 09/06/16 Mild septal hypertrophy with mod post wall hypertrophy, EF 55-60, no RWMA, Gr 1 DD, mild AI, trivial MR, mild RAE, GLS -17.2%  Past Medical History:  Diagnosis Date  . Arthritis   . BPH (benign prostatic hyperplasia)   . CKD (chronic kidney disease), stage II    a. CKD II-III by labs.  . Dizziness 10/21/2016  . Esophageal dysmotility   . Essential hypertension    Trial off acei 08/12/2017 for unexplained sob/ noct smothering   . GERD (gastroesophageal reflux disease)   .  GI bleed   . Glaucoma    BOTH EYES  . Hyperlipidemia   . Hypertension   . Inguinal hernia without mention of obstruction or gangrene, unilateral or unspecified, (not specified as recurrent)-right s/p repair with mesh 05/21/2014  . Schatzki's ring   . Sinus bradycardia    a. prompting dc of Diltiazem (?chronotropic incompetence) in 2017.  [name]  Sleep apnea    "mild" no cpap  . Thoracic aortic aneurysm (TAA) ([place])    Surgical Hx: The patient  has a past surgical history that includes Neck surgery (2000); Tonsillectomy; Eye surgery; Hernia repair; Inguinal hernia repair (Right, 06/18/2014); Insertion of mesh (Right, 06/18/2014); and Colonoscopy with propofol (N/A, 05/11/2016).   Current Medications: Current Meds  Medication Sig  . amLODipine (NORVASC) 10 MG tablet Take 1 tablet (10 mg total) by mouth daily.  [name] atorvastatin (LIPITOR) 40 MG tablet Take 1 tablet (40 mg total) by mouth daily at 6 PM.  . clopidogrel (PLAVIX) 75 MG tablet Take 1 tablet (75 mg total) by mouth daily.  [name] doxazosin (CARDURA) 4 MG tablet Take 4 mg by mouth at bedtime.   [name] esomeprazole (NEXIUM) 40 MG capsule Take 30-60 min before first meal of the day  . fluticasone (FLONASE) 50 MCG/ACT nasal spray Place 2 sprays into both nostrils daily as needed for allergies or rhinitis.   [name] irbesartan (AVAPRO) 150 MG tablet Take 1 tablet (150 mg total) by mouth daily. Pt needed a 10 day supply until mail order arrives. Thank you  . loratadine (CLARITIN) 10 MG tablet Take 10 mg by mouth daily.  . potassium chloride (K-DUR) 10 MEQ tablet Take 2 tablets (20 mEq total) by mouth 3 (three) times daily.  [name] spironolactone (ALDACTONE) 25 MG tablet Take 0.5 tablets (12.5 mg total) by mouth daily.  . timolol (TIMOPTIC) 0.5 % ophthalmic solution Place 1 drop into both eyes daily.      Allergies:   Aspirin, Codeine, and Shellfish allergy   Social History   Tobacco Use  . Smoking status: Never Smoker  . Smokeless tobacco: Never Used  Substance Use Topics  . Alcohol use: Yes    Comment: none since 80's  . Drug use: No     Family Hx: The patient's family history includes Cancer in his brother; Diabetes in his sister; Heart disease in his mother; Leukemia in his maternal grandmother; Stroke in his father.  ROS:   Please see the history of present illness.    ROS All other systems  reviewed and are negative.   EKGs/Labs/Other Test Reviewed:    EKG:  EKG is  ordered today.  The ekg ordered today demonstrates normal sinus rhythm, heart rate 61, normal axis, first-degree AV block, PR 240, no ST-T wave changes, QTC 418  Recent Labs: 09/19/2019: ALT 16; BUN 15; Creatinine, Ser 1.40; Hemoglobin 15.0; Platelets 196; Potassium 4.0; Sodium 141   Recent Lipid Panel Lab Results  Component Value Date/Time   CHOL 176 09/20/2019 07:40 AM   TRIG 96 09/20/2019 07:40 AM   HDL 44 09/20/2019 07:40 AM   CHOLHDL 4.0 09/20/2019 07:40 AM   LDLCALC 113 (H) 09/20/2019 07:40 AM    Physical Exam:    VS:  BP 132/72   Pulse 62   Ht 5\' 8"  (1.727 m)   Wt 212 lb (96.2 kg)   BMI 32.23 kg/m     Wt Readings from Last 3 Encounters:  09/25/19 212 lb (96.2 kg)  09/19/19 214 lb 1.1 oz (97.1 kg)  04/17/19 209 lb 3.2 oz (94.9 kg)     Physical Exam  Constitutional: He is oriented to person, place, and time. He appears well-developed and well-nourished. No distress.  HENT:  Head: Normocephalic and atraumatic.  Eyes: No scleral icterus.  Neck: No JVD present. No thyromegaly present.  Cardiovascular: Normal rate and regular rhythm.  No murmur heard. Pulmonary/Chest: Effort normal. He has no rales.  Abdominal: Soft. He exhibits no distension.  Musculoskeletal:        General: No edema.  Lymphadenopathy:    He has no cervical adenopathy.  Neurological: He is alert and oriented to person, place, and time.  Skin: Skin is warm and dry.  Psychiatric: He has a normal mood and affect.    ASSESSMENT & PLAN:    1. Amaurosis fugax He had a fairly negative work-up in the hospital.  Also, sed rate was normal.  He has already seen ophthalmology.  He has an upcoming appointment with neurology.  I did explain to the importance of taking clopidogrel as well as atorvastatin.  He plans to start those medications.  I think it is unlikely that he had atrial fibrillation to contribute to this.  However,  it would be important to rule this out.  -Arrange 30-day event monitor  2. Thoracic aortic aneurysm without rupture (HCC) 4.1 cm by CT in May 2020.  3. Essential hypertension The patient's blood pressure is controlled on his current regimen.  Continue current therapy.   4. Hyperlipidemia, unspecified hyperlipidemia type As noted, I have encouraged him to start atorvastatin.  5. Sinus bradycardia Heart rate today is in the 60s.  6. Chronic kidney disease (CKD), stage II (mild) Recent creatinine 1.4.   Dispo:  Return in about 6 months (around 03/24/2020) for Routine Follow Up with Dr. Meda Coffee, in person.   Medication Adjustments/Labs and Tests Ordered: Current medicines are reviewed at length with the patient today.  Concerns regarding medicines are outlined above.  Tests Ordered: Orders Placed This Encounter  Procedures  . Cardiac event monitor  . HEART 12-Lead   Medication Changes: No orders of the defined types were placed in this encounter.   Signed, [name], PA-C  09/25/2019 5:27 PM    [facility] [address], [place], [place]  [address] Phone: [phone]; Fax: [phone]

## 2019-09-25 NOTE — Patient Instructions (Signed)
Medication Instructions:  Your physician recommends that you continue on your current medications as directed. Please refer to the Current Medication list given to you today.  *If you need a refill on your cardiac medications before your next appointment, please call your pharmacy*  Lab Work: [place]   If you have labs (blood work) drawn today and your tests are completely normal, you will receive your results only by: [name] MyChart Message (if you have MyChart) OR . A paper copy in the mail If you have any lab test that is abnormal or we need to change your treatment, we will call you to review the results.  Testing/Procedures: Your physician has recommended that you wear an event monitor. Event monitors are medical devices that record the heart's electrical activity. Doctors most often [place] these monitors to diagnose arrhythmias. Arrhythmias are problems with the speed or rhythm of the heartbeat. The monitor is a small, portable device. You can wear one while you do your normal daily activities. This is usually used to diagnose what is causing palpitations/syncope (passing out).  Follow-Up: At [facility], you and your health needs are our priority.  As part of our continuing mission to provide you with exceptional heart care, we have created designated Provider Care Teams.  These Care Teams include your primary Cardiologist (physician) and Advanced Practice Providers (APPs -  Physician Assistants and Nurse Practitioners) who all work together to provide you with the care you need, when you need it.  Your next appointment:   6 months  The format for your next appointment:   In Person  Provider:   You may see [name], MD or one of the following Advanced Practice Providers on your designated Care Team:    Melina Copa, PA-C  Ermalinda Barrios, PA-C   Other Instructions

## 2019-10-09 ENCOUNTER — Telehealth: Payer: Self-pay | Admitting: *Deleted

## 2019-10-09 NOTE — Telephone Encounter (Signed)
[employer] to ship a 30 day cardiac event monitor to the patients home.  Instructions reviewed briefly as they are included in the monitor kit. 

## 2019-10-16 ENCOUNTER — Encounter: Payer: Self-pay | Admitting: Adult Health

## 2019-10-16 ENCOUNTER — Other Ambulatory Visit: Payer: Self-pay

## 2019-10-16 ENCOUNTER — Ambulatory Visit (INDEPENDENT_AMBULATORY_CARE_PROVIDER_SITE_OTHER): Payer: Medicare Other | Admitting: Adult Health

## 2019-10-16 VITALS — BP 118/74 | HR 76 | Temp 97.2°F | Ht 68.0 in | Wt 211.0 lb

## 2019-10-16 DIAGNOSIS — I10 Essential (primary) hypertension: Secondary | ICD-10-CM | POA: Diagnosis not present

## 2019-10-16 DIAGNOSIS — E785 Hyperlipidemia, unspecified: Secondary | ICD-10-CM | POA: Diagnosis not present

## 2019-10-16 DIAGNOSIS — G453 Amaurosis fugax: Secondary | ICD-10-CM

## 2019-10-16 NOTE — Patient Instructions (Signed)
Continue clopidogrel 75 mg daily  and Lipitor 40 mg daily for secondary stroke prevention  Continue to follow up with PCP regarding cholesterol and blood pressure management   Continue to follow with ophthalmology as scheduled  Continue to monitor blood pressure at home  Maintain strict control of hypertension with blood pressure goal below 130/90, diabetes with hemoglobin A1c goal below 6.5% and cholesterol with LDL cholesterol (bad cholesterol) goal below 70 mg/dL. I also advised the patient to eat a healthy diet with plenty of whole grains, cereals, fruits and vegetables, exercise regularly and maintain ideal body weight.  Followup in the future with me in 6 months or call earlier if needed       Thank you for coming to see [place] at [facility]. I hope we have been able to provide you high quality care today.  You may receive a patient satisfaction survey over the next few weeks. We would appreciate your feedback and comments so that we may continue to improve ourselves and the health of our patients.

## 2019-10-16 NOTE — Progress Notes (Signed)
[facility] [address] [place]. [place] [address] [phone]       HOSPITAL FOLLOW UP NOTE  [name] Date of Birth:  October 13, 1941 Medical Record Number:  [id number]   Reason for Referral:  hospital stroke follow up    CHIEF COMPLAINT:  Chief Complaint  Patient presents with  . Follow-up    tx room, alone. No concerns. Feeling better.     HPI: Bob Elliott being seen today for in office hospital follow-up regarding right amaurosis fugax on 09/19/2019.  History obtained from patient and chart review. Reviewed all radiology images and labs personally.  Bob Elliott is a 78 y.o. male with history of HTN, HLD, OSA, aortic aneurysm, CKD  presented on 09/20/2019 with transient vision loss affecting his right eye x 5-7 mins.   CT head and MRI unremarkable for acute infarct.  MRA showed mild intracranial atherosclerosis.  CTA neck negative.  Carotid Doppler bilateral ICA 1- 39% stenosis.  2D echo showed an EF of 60 to 65%.  Symptoms likely related to right amaurosis fugax.  Recommended clopidogrel 75 mg daily as patient has known aspirin allergy.  LDL 113 and initiated atorvastatin 40 mg daily.  No prior history of evidence of DM with A1c 5.4.  HTN stable.  Other stroke risk factors include advanced age, EtOH use, obesity, family history of stroke and OSA mild not on CPAP but no prior history of stroke.  Other active problems include CKD stage II.  Discharged home in stable condition recommendation of ophthalmology follow-up.  Bob Elliott is a 78 year old male who is being seen today for hospital follow-up.  He has been doing well since discharge without reoccurring visual symptoms.  He did have follow-up with ophthalmology since discharge but is unsure of findings.  He is routinely monitored at [facility] ophthalmology.  He has continued on Plavix and atorvastatin but questions ongoing need of medications.  Blood pressure today 118/74.  Continues to follow with  cardiology regularly.  No further concerns at this time.    ROS:   14 system review of systems performed and negative with exception of no concerns  PMH:  Past Medical History:  Diagnosis Date  . Arthritis   . BPH (benign prostatic hyperplasia)   . CKD (chronic kidney disease), stage II    a. CKD II-III by labs.  . Dizziness 10/21/2016  . Esophageal dysmotility   . Essential hypertension    Trial off acei 08/12/2017 for unexplained sob/ noct smothering   . GERD (gastroesophageal reflux disease)   . GI bleed   . Glaucoma    BOTH EYES  . Hyperlipidemia   . Hypertension   . Inguinal hernia without mention of obstruction or gangrene, unilateral or unspecified, (not specified as recurrent)-right s/p repair with mesh 05/21/2014  . Schatzki's ring   . Sinus bradycardia    a. prompting dc of Diltiazem (?chronotropic incompetence) in 2017.  [name] Sleep apnea    "mild" no cpap  . Thoracic aortic aneurysm (TAA) (HCC)     PSH:  Past Surgical History:  Procedure Laterality Date  . COLONOSCOPY WITH PROPOFOL N/A 05/11/2016   Procedure: COLONOSCOPY WITH PROPOFOL;  Surgeon: Garlan Fair, MD;  Location: [facility];  Service: Endoscopy;  Laterality: N/A;  . EYE SURGERY     CATARACT R  EYE  . HERNIA REPAIR     UMBILICAL  . INGUINAL HERNIA REPAIR Right 06/18/2014   Procedure: RIGHT INGUINAL HERNIA REPAIR;  Surgeon: Sherren Mocha  Adalberto Cole, MD;  Location: [place];  Service: General;  Laterality: Right;  . INSERTION OF MESH Right 06/18/2014   Procedure: INSERTION OF MESH;  Surgeon: Odis Hollingshead, MD;  Location: [place];  Service: General;  Laterality: Right;  . NECK SURGERY  2000   cervical disc surgery(limited ROM)- retained hardware  . TONSILLECTOMY      Social History:  Social History   Socioeconomic History  . Marital status: Married    Spouse name: Not on file  . Number of children: 4  . Years of education: Not on file  . Highest education level: Not on file  Occupational History  .  Occupation: retired  Scientific laboratory technician  . Financial resource strain: Not on file  . Food insecurity    Worry: Not on file    Inability: Not on file  . Transportation needs    Medical: Not on file    Non-medical: Not on file  Tobacco Use  . Smoking status: Never Smoker  . Smokeless tobacco: Never Used  Substance and Sexual Activity  . Alcohol use: Yes    Comment: none since 80's  . Drug use: No  . Sexual activity: Not Currently  Lifestyle  . Physical activity    Days per week: Not on file    Minutes per session: Not on file  . Stress: Not on file  Relationships  . Social Herbalist on phone: Not on file    Gets together: Not on file    Attends religious service: Not on file    Active member of club or organization: Not on file    Attends meetings of clubs or organizations: Not on file    Relationship status: Not on file  . Intimate partner violence    Fear of current or ex partner: Not on file    Emotionally abused: Not on file    Physically abused: Not on file    Forced sexual activity: Not on file  Other Topics Concern  . Not on file  Social History Narrative  . Not on file    Family History:  Family History  Problem Relation Age of Onset  . Heart disease Mother   . Stroke Father   . Cancer Brother        colon  . Diabetes Sister   . Leukemia Maternal Grandmother     Medications:   Current Outpatient Medications on File Prior to Visit  Medication Sig Dispense Refill  . amLODipine (NORVASC) 10 MG tablet Take 1 tablet (10 mg total) by mouth daily. 90 tablet 3  . atorvastatin (LIPITOR) 40 MG tablet Take 1 tablet (40 mg total) by mouth daily at 6 PM. 30 tablet 0  . clopidogrel (PLAVIX) 75 MG tablet Take 1 tablet (75 mg total) by mouth daily. 30 tablet 0  . doxazosin (CARDURA) 4 MG tablet Take 4 mg by mouth at bedtime.     [name] esomeprazole (NEXIUM) 40 MG capsule Take 30-60 min before first meal of the day    . fluticasone (FLONASE) 50 MCG/ACT nasal spray  Place 2 sprays into both nostrils daily as needed for allergies or rhinitis.     [name] irbesartan (AVAPRO) 150 MG tablet Take 1 tablet (150 mg total) by mouth daily. Pt needed a 10 day supply until mail order arrives. Thank you 10 tablet 0  . loratadine (CLARITIN) 10 MG tablet Take 10 mg by mouth daily.    . potassium chloride (K-DUR) 10 MEQ  tablet Take 2 tablets (20 mEq total) by mouth 3 (three) times daily. 540 tablet 3  . spironolactone (ALDACTONE) 25 MG tablet Take 0.5 tablets (12.5 mg total) by mouth daily. 45 tablet 3  . timolol (TIMOPTIC) 0.5 % ophthalmic solution Place 1 drop into both eyes daily.   4   No current facility-administered medications on file prior to visit.     Allergies:   Allergies  Allergen Reactions  . Aspirin Hives, Itching and Nausea Only  . Codeine Hives, Itching and Nausea Only  . Shellfish Allergy Hives     Physical Exam  Vitals:   10/16/19 1310  BP: 118/74  Pulse: 76  Temp: (!) 97.2 F (36.2 C)  Weight: 211 lb (95.7 kg)  Height: 5\' 8"  (1.727 m)   Body mass index is 32.08 kg/m. No exam data present  Depression screen [facility] 2/9 10/16/2019  Decreased Interest 0  Down, Depressed, Hopeless 0  PHQ - 2 Score 0     General: well developed, well nourished, pleasant elderly male, seated, in no evident distress Head: head normocephalic and atraumatic.   Neck: supple with no carotid or supraclavicular bruits Cardiovascular: regular rate and rhythm, no murmurs Musculoskeletal: no deformity Skin:  no rash/petichiae Vascular:  Normal pulses all extremities   Neurologic Exam Mental Status: Awake and fully alert. Oriented to place and time. Recent and remote memory intact. Attention span, concentration and fund of knowledge appropriate. Mood and affect appropriate.  Cranial Nerves: Fundoscopic exam reveals sharp disc margins. Pupils equal, briskly reactive to light. Extraocular movements full without nystagmus. Visual fields full to confrontation. Hearing  intact. Facial sensation intact. Face, tongue, palate moves normally and symmetrically.  Motor: Normal bulk and tone. Normal strength in all tested extremity muscles. Sensory.: intact to touch , pinprick , position and vibratory sensation.  Coordination: Rapid alternating movements normal in all extremities. Finger-to-nose and heel-to-shin performed accurately bilaterally. Gait and Station: Arises from chair without difficulty. Stance is normal. Gait demonstrates normal stride length and balance Reflexes: 1+ and symmetric. Toes downgoing.       Diagnostic Data (Labs, Imaging, Testing)  CT HEAD WO CONTRAST 09/19/2019 IMPRESSION: 1. No evidence of acute intracranial abnormality. 2. ASPECTS is 10. 3. 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  CT ANGIO NECK W OR WO CONTRAST 11/19/2018 IMPRESSION: No significant carotid or vertebral stenosis in the neck.  MR BRAIN WO CONTRAST MR MRA HEAD  09/20/2019 IMPRESSION: MRI HEAD IMPRESSION: 1. No acute intracranial infarct or other abnormality identified. 2. Mild age-related cerebral atrophy with chronic small vessel ischemic disease. MRA HEAD IMPRESSION: 1. Negative intracranial MRA for large vessel occlusion. 2. Mild intracranial atherosclerotic change for age. No hemodynamically significant or correctable stenosis identified.  ECHOCARDIOGRAM 09/20/2019 IMPRESSIONS  1. Left ventricular ejection fraction, by visual estimation, is 60 to 65%. The left ventricle has normal function. There is mildly increased left ventricular hypertrophy.  2. Left ventricular diastolic parameters are consistent with Grade I diastolic dysfunction (impaired relaxation).  3. The left ventricle has no regional wall motion abnormalities.  4. Global right ventricle has normal systolic function.The right ventricular size is normal. No increase in right ventricular wall thickness.  5. Left atrial size was normal.  6. Right atrial  size was normal.  7. The mitral valve is normal in structure. No evidence of mitral valve regurgitation. No evidence of mitral stenosis.  8. The tricuspid valve is normal in structure. Tricuspid valve regurgitation is not demonstrated.  9. The aortic  valve is tricuspid. Aortic valve regurgitation is mild. No evidence of aortic valve sclerosis or stenosis. 10. There is mild dilatation of the aortic root measuring 41 mm. 11. TR signal is inadequate for assessing pulmonary artery systolic pressure. 12. The inferior vena cava is normal in size with greater than 50% respiratory variability, suggesting right atrial pressure of 3 mmHg.   ASSESSMENT: AJIT YOHEY is a 78 y.o. year old male presented with transient right eye vision loss on 09/19/2019 likely secondary to right amaurosis fugax. Vascular risk factors include HTN, HLD, OSA and aortic aneurysm.  Has been stable from a neurological standpoint without reoccurring visual loss or stroke/TIA symptoms    PLAN:  1. Right amaurosis fugax: Continue clopidogrel 75 mg daily  and Lipitor 40 mg daily for secondary stroke prevention.  Discussion regarding use of clopidogrel and atorvastatin as symptoms potentially could have been related to small vessel disease.  Unable to be placed on aspirin due to allergy history.  Advised to continue to follow with ophthalmology as scheduled maintain strict control of hypertension with blood pressure goal below 130/90, diabetes with hemoglobin A1c goal below 6.5% and cholesterol with LDL cholesterol (bad cholesterol) goal below 70 mg/dL.  I also advised the patient to eat a healthy diet with plenty of whole grains, cereals, fruits and vegetables, exercise regularly with at least 30 minutes of continuous activity daily and maintain ideal body weight. 2. HTN: Advised to continue current treatment regimen.  Today's BP stable.  Advised to continue to monitor at home along with continued follow-up with PCP for management 3.  HLD: Advised to continue current treatment regimen at this time but will recheck lipid panel and if LDL less than 70, can consider decreasing dosage but did advise recommend continuation of lifelong    Follow up in 6 months or call earlier if needed   Greater than 50% of time during this 45 minute visit was spent on counseling, explanation of diagnosis of right amaurosis fugax, reviewing risk factor management of HTN and HLD, planning of further management along with potential future management, and discussion with patient and family answering all questions.    Frann Rider, AGNP-BC  [facility] [address] [place], [place] [address]  Phone [phone] Fax [phone] Note: This document was prepared with digital dictation and possible smart phrase technology. Any transcriptional errors that result from this process are unintentional.

## 2019-10-17 ENCOUNTER — Other Ambulatory Visit: Payer: Self-pay | Admitting: Adult Health

## 2019-10-17 ENCOUNTER — Telehealth: Payer: Self-pay | Admitting: Cardiology

## 2019-10-17 LAB — LIPID PANEL
Chol/HDL Ratio: 2.2 ratio (ref 0.0–5.0)
Cholesterol, Total: 93 mg/dL — ABNORMAL LOW (ref 100–199)
HDL: 42 mg/dL (ref >39)
LDL Chol Calc (NIH): 27 mg/dL (ref 0–99)
Triglycerides: 139 mg/dL (ref 0–149)
VLDL Cholesterol Cal: 24 mg/dL (ref 5–40)

## 2019-10-17 MED ORDER — ATORVASTATIN CALCIUM 20 MG PO TABS: 20.0000 mg | ORAL_TABLET | Freq: Every day | ORAL | 0 refills | Status: DC

## 2019-10-17 NOTE — Telephone Encounter (Signed)
I think it is still a good idea to wear the monitor so that we can assess for atrial fibrillation.  [name], PA-C    10/17/2019 8:50 PM

## 2019-10-17 NOTE — Progress Notes (Signed)
I agree with the above plan 

## 2019-10-17 NOTE — Telephone Encounter (Signed)
New Message    Pt says he saw his neurologist yesterday and says his heart is doing fine. He also said he had labs done. Pt received the monitor and is wondering if he still needs to use it? He says he is feeling good and doesn't think he needs to wear it     Please call

## 2019-10-18 NOTE — Telephone Encounter (Signed)
I spoke with the patient and informed him that Bob Elliott would like him to wear monitor to detect Atrial Fibrillation.  He verbalized understanding.

## 2019-10-19 ENCOUNTER — Encounter: Payer: Self-pay | Admitting: Cardiology

## 2019-10-19 ENCOUNTER — Ambulatory Visit (INDEPENDENT_AMBULATORY_CARE_PROVIDER_SITE_OTHER): Payer: Medicare Other

## 2019-10-19 DIAGNOSIS — I440 Atrioventricular block, first degree: Secondary | ICD-10-CM

## 2019-10-19 DIAGNOSIS — R001 Bradycardia, unspecified: Secondary | ICD-10-CM

## 2019-10-19 DIAGNOSIS — I712 Thoracic aortic aneurysm, without rupture: Secondary | ICD-10-CM | POA: Diagnosis not present

## 2019-10-22 ENCOUNTER — Telehealth: Payer: Self-pay

## 2019-10-22 NOTE — Telephone Encounter (Signed)
Called and went over his recent Lipid panel results per NP Frann Rider.   "Notes recorded by Frann Rider, NP on 10/17/2019 at 8:29 AM EST  Please advise patient that recent cholesterol levels greatly improved therefore can decrease dose of atorvastatin to 20 mg daily but would recommend continuation lifelong for stroke prevention. Refill will be placed and please advise ongoing refills by PCP"   Pt states that he understands but "what about the other one? The blood thinner"

## 2019-10-22 NOTE — Telephone Encounter (Signed)
Called and informed him about the blood thinner and the atorvastatin. Informed him for further refills he needs to contact his PCP.  Pt states that he understands.

## 2019-10-22 NOTE — Telephone Encounter (Signed)
This was discussed during the visit which can be found on the office note.  He was advised to continue clopidogrel 75 mg daily for stroke prevention

## 2019-10-23 NOTE — Telephone Encounter (Signed)
I called pt and he has not taken the plavix or cholesterol for the last 2-3 days.  He states that he was feeling SE of difficulty thinking, lack of energy, anxious.  I relayed that his cholesterol medication could be doing this. There are different kinds of meds for this. He did not reduce as ordered to 20mg  po daily.  I reiterated last ofv note recommended to take plavix as ordered to prevent stroke.   He will talk with his pcp about this.  He will call.

## 2019-10-23 NOTE — Telephone Encounter (Signed)
Patient called stating that he has been feeling anxious, has difficulty thinking, says he didn't know where he was at times, tired, lack of energy and is unsure which medication has been causing him to feel this way    . Please follow up

## 2019-10-24 DIAGNOSIS — G453 Amaurosis fugax: Secondary | ICD-10-CM | POA: Diagnosis not present

## 2019-10-25 ENCOUNTER — Telehealth: Payer: Self-pay | Admitting: *Deleted

## 2019-10-25 NOTE — Telephone Encounter (Signed)
Patient started cardiac event monitor 10/19/2019.

## 2019-10-29 ENCOUNTER — Other Ambulatory Visit: Payer: Self-pay | Admitting: Cardiology

## 2019-10-30 ENCOUNTER — Emergency Department (HOSPITAL_COMMUNITY)
Admission: EM | Admit: 2019-10-30 | Discharge: 2019-10-30 | Disposition: A | Discharge status: Home / Self Care | Payer: Medicare Other | Attending: Emergency Medicine | Admitting: Emergency Medicine

## 2019-10-30 ENCOUNTER — Encounter (HOSPITAL_COMMUNITY): Payer: Self-pay | Admitting: Emergency Medicine

## 2019-10-30 ENCOUNTER — Other Ambulatory Visit: Payer: Self-pay

## 2019-10-30 ENCOUNTER — Emergency Department (HOSPITAL_COMMUNITY): Payer: Medicare Other

## 2019-10-30 DIAGNOSIS — R1031 Right lower quadrant pain: Secondary | ICD-10-CM | POA: Diagnosis not present

## 2019-10-30 DIAGNOSIS — M545 Low back pain, unspecified: Secondary | ICD-10-CM

## 2019-10-30 DIAGNOSIS — I129 Hypertensive chronic kidney disease with stage 1 through stage 4 chronic kidney disease, or unspecified chronic kidney disease: Secondary | ICD-10-CM | POA: Diagnosis not present

## 2019-10-30 DIAGNOSIS — R1030 Lower abdominal pain, unspecified: Secondary | ICD-10-CM

## 2019-10-30 DIAGNOSIS — N200 Calculus of kidney: Secondary | ICD-10-CM | POA: Diagnosis not present

## 2019-10-30 DIAGNOSIS — N182 Chronic kidney disease, stage 2 (mild): Secondary | ICD-10-CM | POA: Insufficient documentation

## 2019-10-30 DIAGNOSIS — R109 Unspecified abdominal pain: Secondary | ICD-10-CM | POA: Diagnosis present

## 2019-10-30 DIAGNOSIS — Z79899 Other long term (current) drug therapy: Secondary | ICD-10-CM | POA: Insufficient documentation

## 2019-10-30 DIAGNOSIS — I10 Essential (primary) hypertension: Secondary | ICD-10-CM | POA: Diagnosis not present

## 2019-10-30 LAB — COMPREHENSIVE METABOLIC PANEL
ALT: 20 U/L (ref 0–44)
AST: 16 U/L (ref 15–41)
Albumin: 4.1 g/dL (ref 3.5–5.0)
Alkaline Phosphatase: 64 U/L (ref 38–126)
Anion gap: 7 (ref 5–15)
BUN: 16 mg/dL (ref 8–23)
CO2: 27 mmol/L (ref 22–32)
Calcium: 8.9 mg/dL (ref 8.9–10.3)
Chloride: 104 mmol/L (ref 98–111)
Creatinine, Ser: 1.15 mg/dL (ref 0.61–1.24)
GFR calc Af Amer: >60 mL/min (ref >60)
GFR calc non Af Amer: >60 mL/min (ref >60)
Glucose, Bld: 123 mg/dL — ABNORMAL HIGH (ref 70–99)
Potassium: 4.3 mmol/L (ref 3.5–5.1)
Sodium: 138 mmol/L (ref 135–145)
Total Bilirubin: 1.1 mg/dL (ref 0.3–1.2)
Total Protein: 7 g/dL (ref 6.5–8.1)

## 2019-10-30 LAB — CBC
HCT: 45.6 % (ref 39.0–52.0)
Hemoglobin: 15.1 g/dL (ref 13.0–17.0)
MCH: 30.7 pg (ref 26.0–34.0)
MCHC: 33.1 g/dL (ref 30.0–36.0)
MCV: 92.7 fL (ref 80.0–100.0)
Platelets: 182 K/uL (ref 150–400)
RBC: 4.92 MIL/uL (ref 4.22–5.81)
RDW: 12.7 % (ref 11.5–15.5)
WBC: 6.9 K/uL (ref 4.0–10.5)
nRBC: 0 % (ref 0.0–0.2)

## 2019-10-30 LAB — URINALYSIS, ROUTINE W REFLEX MICROSCOPIC
Bilirubin Urine: NEGATIVE
Glucose, UA: NEGATIVE mg/dL
Hgb urine dipstick: NEGATIVE
Ketones, ur: NEGATIVE mg/dL
Leukocytes,Ua: NEGATIVE
Nitrite: NEGATIVE
Protein, ur: NEGATIVE mg/dL
Specific Gravity, Urine: 1.013 (ref 1.005–1.030)
pH: 6 (ref 5.0–8.0)

## 2019-10-30 LAB — I-STAT CHEM 8, ED
BUN: 15 mg/dL (ref 8–23)
Calcium, Ion: 1.1 mmol/L — ABNORMAL LOW (ref 1.15–1.40)
Chloride: 102 mmol/L (ref 98–111)
Creatinine, Ser: 1.2 mg/dL (ref 0.61–1.24)
Glucose, Bld: 119 mg/dL — ABNORMAL HIGH (ref 70–99)
HCT: 46 % (ref 39.0–52.0)
Hemoglobin: 15.6 g/dL (ref 13.0–17.0)
Potassium: 4.2 mmol/L (ref 3.5–5.1)
Sodium: 138 mmol/L (ref 135–145)
TCO2: 26 mmol/L (ref 22–32)

## 2019-10-30 LAB — LIPASE, BLOOD: Lipase: 24 U/L (ref 11–51)

## 2019-10-30 MED ORDER — SODIUM CHLORIDE (PF) 0.9 % IJ SOLN
INTRAMUSCULAR | Status: AC
  Filled 2019-10-30: qty 50

## 2019-10-30 MED ORDER — MORPHINE SULFATE (PF) 4 MG/ML IV SOLN
4.0000 mg | Freq: Once | INTRAVENOUS | Status: AC
  Administered 2019-10-30: 4 mg via INTRAVENOUS
  Filled 2019-10-30: qty 1

## 2019-10-30 MED ORDER — SODIUM CHLORIDE 0.9% FLUSH
3.0000 mL | Freq: Once | INTRAVENOUS | Status: AC
  Administered 2019-10-30: 06:00:00 3 mL via INTRAVENOUS

## 2019-10-30 MED ORDER — SODIUM CHLORIDE 0.9 % IV BOLUS
1000.0000 mL | Freq: Once | INTRAVENOUS | Status: AC
  Administered 2019-10-30: 1000 mL via INTRAVENOUS

## 2019-10-30 MED ORDER — IOHEXOL 300 MG/ML  SOLN
100.0000 mL | Freq: Once | INTRAMUSCULAR | Status: AC | PRN
  Administered 2019-10-30: 100 mL via INTRAVENOUS

## 2019-10-30 MED ORDER — ONDANSETRON HCL 4 MG/2ML IJ SOLN
4.0000 mg | Freq: Once | INTRAMUSCULAR | Status: AC
  Administered 2019-10-30: 4 mg via INTRAVENOUS
  Filled 2019-10-30: qty 2

## 2019-10-30 NOTE — ED Provider Notes (Signed)
[facility] DEPT Provider Note   CSN: [id number] Arrival date & time: 10/30/19  0306     History Chief Complaint  Patient presents with  . Abdominal Pain  . Back Pain    Bob Elliott is a 78 y.o. male.  78 yo M with a chief complaint of lower abdominal pain.  Going on since yesterday.  Started more in the flanks and now has moved to the bilateral lower quadrants.  Has had some distention and decreased bowel movements.  Nausea but no vomiting.  No fevers.  Describes the pain is terrible.  Nothing seems to make it better or worse.  The history is provided by the patient.  Abdominal Pain Pain location:  LLQ and RLQ Pain quality: bloating and cramping   Pain radiates to:  Does not radiate Pain severity:  Severe Onset quality:  Gradual Duration:  2 days Timing:  Constant Progression:  Waxing and waning Chronicity:  New Relieved by:  Nothing Worsened by:  Nothing Ineffective treatments:  None tried Associated symptoms: nausea   Associated symptoms: no chest pain, no chills, no diarrhea, no fever, no shortness of breath and no vomiting   Back Pain Associated symptoms: abdominal pain   Associated symptoms: no chest pain, no fever and no headaches        Past Medical History:  Diagnosis Date  . Arthritis   . BPH (benign prostatic hyperplasia)   . CKD (chronic kidney disease), stage II    a. CKD II-III by labs.  . Dizziness 10/21/2016  . Esophageal dysmotility   . Essential hypertension    Trial off acei 08/12/2017 for unexplained sob/ noct smothering   . GERD (gastroesophageal reflux disease)   . GI bleed   . Glaucoma    BOTH EYES  . Hyperlipidemia   . Hypertension   . Inguinal hernia without mention of obstruction or gangrene, unilateral or unspecified, (not specified as recurrent)-right s/p repair with mesh 05/21/2014  . Schatzki's ring   . Sinus bradycardia    a. prompting dc of Diltiazem (?chronotropic incompetence) in 2017.  [name]  Sleep apnea    "mild" no cpap  . Thoracic aortic aneurysm (TAA) [facility])     Patient Active Problem List   Diagnosis Date Noted  . Amaurosis fugax 09/20/2019  . 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 Upper airway cough syndrome 04/17/2019  . Thoracic aortic aneurysm ([place]) 07/21/2018  . DOE (dyspnea on exertion) 10/21/2016  . Dizziness 10/21/2016  . Sinus bradycardia 10/21/2016  . Sleep apnea   . Schatzki's ring   . Essential hypertension   . Hyperlipidemia   . Esophageal dysmotility   . GERD (gastroesophageal reflux disease)   . Inguinal hernia without mention of obstruction or gangrene, unilateral or unspecified, (not specified as recurrent)-right s/p repair with mesh 05/21/2014    Past Surgical History:  Procedure Laterality Date  . COLONOSCOPY WITH PROPOFOL N/A 05/11/2016   Procedure: COLONOSCOPY WITH PROPOFOL;  Surgeon: Garlan Fair, MD;  Location: [facility];  Service: Endoscopy;  Laterality: N/A;  . EYE SURGERY     CATARACT R  EYE  . HERNIA REPAIR     UMBILICAL  . INGUINAL HERNIA REPAIR Right 06/18/2014   Procedure: RIGHT INGUINAL HERNIA REPAIR;  Surgeon: Odis Hollingshead, MD;  Location: [place];  Service: General;  Laterality: Right;  . INSERTION OF MESH Right 06/18/2014   Procedure: INSERTION OF MESH;  Surgeon: Odis Hollingshead, MD;  Location: [place];  Service: General;  Laterality: Right;  . NECK SURGERY  2000   cervical disc surgery(limited ROM)- retained hardware  . TONSILLECTOMY         Family History  Problem Relation Age of Onset  . Heart disease Mother   . Stroke Father   . Cancer Brother        colon  . Diabetes Sister   . Leukemia Maternal Grandmother     Social History   Tobacco Use  . Smoking status: Never Smoker  . Smokeless tobacco: Never Used  Substance Use Topics  . Alcohol use: Yes    Comment: none since 80's  . Drug use: No    Home Medications Prior to Admission medications   Medication Sig Start Date End Date  Taking? Authorizing Provider  amLODipine (NORVASC) 10 MG tablet Take 1 tablet (10 mg total) by mouth daily. 11/02/18   Dorothy Spark, MD  atorvastatin (LIPITOR) 20 MG tablet Take 1 tablet (20 mg total) by mouth daily at 6 PM. 10/17/19   Frann Rider, NP  doxazosin (CARDURA) 4 MG tablet Take 4 mg by mouth at bedtime.  02/12/14   [provider]  esomeprazole (NEXIUM) 40 MG capsule Take 30-60 min before first meal of the day 08/12/17   Tanda Rockers, MD  fluticasone [facility]) 50 MCG/ACT nasal spray Place 2 sprays into both nostrils daily as needed for allergies or rhinitis.  03/28/14   [provider]  irbesartan (AVAPRO) 150 MG tablet Take 1 tablet (150 mg total) by mouth daily. Pt needed a 10 day supply until mail order arrives. Thank you 08/06/19   Dorothy Spark, MD  loratadine (CLARITIN) 10 MG tablet Take 10 mg by mouth daily.    [provider]  potassium chloride (K-DUR) 10 MEQ tablet Take 2 tablets (20 mEq total) by mouth 3 (three) times daily. 11/02/18   Dorothy Spark, MD  spironolactone (ALDACTONE) 25 MG tablet Take 0.5 tablets (12.5 mg total) by mouth daily. 11/02/18   Dorothy Spark, MD  timolol (TIMOPTIC) 0.5 % ophthalmic solution Place 1 drop into both eyes daily.  11/25/17   [provider]    Allergies    Aspirin, Codeine, and Shellfish allergy  Review of Systems   Review of Systems  Constitutional: Negative for chills and fever.  HENT: Negative for congestion and facial swelling.   Eyes: Negative for discharge and visual disturbance.  Respiratory: Negative for shortness of breath.   Cardiovascular: Negative for chest pain and palpitations.  Gastrointestinal: Positive for abdominal pain and nausea. Negative for diarrhea and vomiting.  Musculoskeletal: Positive for back pain. Negative for arthralgias and myalgias.  Skin: Negative for color change and rash.  Neurological: Negative for tremors, syncope and headaches.    Psychiatric/Behavioral: Negative for confusion and dysphoric mood.    Physical Exam Updated Vital Signs BP (!) 143/80   Pulse 73   Temp 98.4 F (36.9 C) (Oral)   Resp (!) 21   Ht 5\' 8"  (1.727 m)   Wt 95.3 kg   SpO2 96%   BMI 31.93 kg/m   Physical Exam Vitals and nursing note reviewed.  Constitutional:      Appearance: He is well-developed. He is obese.  HENT:     Head: Normocephalic and atraumatic.  Eyes:     Pupils: Pupils are equal, round, and reactive to light.  Neck:     Vascular: No JVD.  Cardiovascular:     Rate and Rhythm: Normal rate and  regular rhythm.     Heart sounds: No murmur. No friction rub. No gallop.   Pulmonary:     Effort: No respiratory distress.     Breath sounds: No wheezing.  Abdominal:     General: There is distension (tympanitic to percussion).     Tenderness: There is no guarding or rebound.  Musculoskeletal:        General: Normal range of motion.     Cervical back: Normal range of motion and neck supple.  Skin:    Coloration: Skin is not pale.     Findings: No rash.  Neurological:     Mental Status: He is alert and oriented to person, place, and time.  Psychiatric:        Behavior: Behavior normal.     ED Results / Procedures / Treatments   Labs (all labs ordered are listed, but only abnormal results are displayed) Labs Reviewed  COMPREHENSIVE METABOLIC PANEL - Abnormal; Notable for the following components:      Result Value   Glucose, Bld 123 (*)    All other components within normal limits  I-STAT CHEM 8, ED - Abnormal; Notable for the following components:   Glucose, Bld 119 (*)    Calcium, Ion 1.10 (*)    All other components within normal limits  LIPASE, BLOOD  CBC  URINALYSIS, ROUTINE W REFLEX MICROSCOPIC    EKG EKG Interpretation  Date/Time:  Tuesday October 30 2019 04:17:18 EST Ventricular Rate:  64 PR Interval:    QRS Duration: 97 QT Interval:  414 QTC Calculation: 428 R Axis:   58 Text  Interpretation: Sinus rhythm Prolonged PR interval Probable left atrial enlargement Borderline low voltage, extremity leads Minimal ST elevation, inferior leads Baseline wander in lead(s) V3 No significant change since last tracing Confirmed by Deno Etienne [phone]) on 10/30/2019 5:22:46 AM   Radiology CT ABDOMEN PELVIS W CONTRAST  Result Date: 10/30/2019 CLINICAL DATA:  Right abdominal and lower back pain. Abdominal distention EXAM: CT ABDOMEN AND PELVIS WITH CONTRAST TECHNIQUE: Multidetector CT imaging of the abdomen and pelvis was performed using the standard protocol following bolus administration of intravenous contrast. CONTRAST:  164mL OMNIPAQUE IOHEXOL 300 MG/ML  SOLN COMPARISON:  03/11/2011 FINDINGS: Lower chest: Simple cystic density right of the heart measuring 25 mm. This was likely minimally covered on prior, favor duplication/pericardial cyst. Atherosclerotic calcification along the right coronary. Hepatobiliary: Multiple hepatic cystic lesions measuring up to 3 cm in the right liver, increased from prior but still overall benign appearing.No evidence of biliary obstruction or stone. Pancreas: Unremarkable. Spleen: Unremarkable. Adrenals/Urinary Tract: Negative adrenals. No hydronephrosis or ureteral stone. Bilateral renal calculi (2 left and single right) measuring up to 3 mm. Unremarkable bladder. Stomach/Bowel: No evidence of bowel mass or inflammation. No appendicitis. Vascular/Lymphatic: No acute vascular abnormality. Atherosclerotic calcification of the aorta and iliacs. There are 2 enlarged lymph nodes in the ileocolic distribution, 1 rounded and 17 mm and the other more ovoid and hypervascular, 20 x 13 mm. These were normal size on comparison. No retroperitoneal adenopathy. Mild mesenteric fat infiltration which is chronic. Reproductive:Symmetric enlargement of the prostate with dystrophic calcification. Other: No ascites or pneumoperitoneum. Musculoskeletal: No acute abnormalities.  Spondylosis. Multilevel bridging osteophytes in the lower thoracic to upper lumbar spine. IMPRESSION: 1. No acute finding. 2. Worrisome adenopathy in the ileocolic mesentery. No underlying mass or inflammation is seen, please ensure patient is up-to-date with colonoscopic screening and consider short-term follow-up CT versus PET-CT. 3. Bilateral nonobstructive nephrolithiasis. Electronically Signed  By: Monte Fantasia M.D.   On: 10/30/2019 07:09   CT L-SPINE NO CHARGE  Result Date: 10/30/2019 CLINICAL DATA:  Lower back pain EXAM: CT lumbar spine with contrast TECHNIQUE: Multiplanar CT images of the lumbar spine were reconstructed from contemporary CT of the abdomen. CONTRAST:  None additional COMPARISON:  None available FINDINGS: Segmentation: 5 lumbar type vertebrae Alignment: Normal Vertebrae: No evidence of fracture, bone lesion, or endplate erosion. Sacroiliac ankylosis. Paraspinal and other soft tissues: Reported separately Disc levels: Osteophytes cause ankylosis from the thoracic spine (at least T9) to L1. Bulky spurring at the more inferior endplates. Degenerative facet spurring at L2-3 and below with marked bulky spurring at L4-5 on both sides and on the right more than left at L5-S1. The foramina appear diffusely patent. 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 IMPRESSION: 1. No emergent finding. 2. Spondylosis which could be inflammatory as there is bilateral sacroiliac ankylosis and spinal ankylosis from the thoracic spine to L1. 3. 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 Electronically Signed   By: Monte Fantasia M.D.   On: 10/30/2019 07:17    Procedures Procedures (including critical care time)  Medications Ordered in ED Medications  sodium chloride flush (NS) 0.9 % injection 3 mL (3 mLs Intravenous Given 10/30/19 0549)  morphine 4 MG/ML injection 4 mg (4 mg Intravenous Given 10/30/19 0551)  ondansetron (ZOFRAN) injection 4 mg (4 mg Intravenous  Given 10/30/19 0550)  sodium chloride 0.9 % bolus 1,000 mL (0 mLs Intravenous Stopped 10/30/19 0700)  iohexol (OMNIPAQUE) 300 MG/ML solution 100 mL (100 mLs Intravenous Contrast Given 10/30/19 0641)    ED Course  I have reviewed the triage vital signs and the nursing notes.  Pertinent labs & imaging results that were available during my care of the patient were reviewed by me and considered in my medical decision making (see chart for details).    MDM Rules/Calculators/A&P                      78 yo M with a chief complaints of diffuse abdominal pain.  Worse to the lower aspects.  Going on since yesterday.  Will obtain a CT scan.  Lab work.  Reassess.  Lab work is unremarkable, no LFT elevation lipase is normal.  No leukocytosis.  Urine is negative for infection.  CT scan has returned and shows a lymph node that is larger than it should be.  This could be a component of a viral syndrome causing his abdominal pain or the radiologist felt that should be screened with a colonoscopy as he had not 1 is scheduled.  I discussed this with the patient.  We will have him do a trial cleanout at home.  PCP follow-up.  7:38 AM:  I have discussed the diagnosis/risks/treatment options with the patient and believe the pt to be eligible for discharge home to follow-up with PCP. We also discussed returning to the ED immediately if new or worsening sx occur. We discussed the sx which are most concerning (e.g., sudden worsening pain, fever, inability to tolerate by mouth) that necessitate immediate return. Medications administered to the patient during their visit and any new prescriptions provided to the patient are listed below.  Medications given during this visit Medications  sodium chloride flush (NS) 0.9 % injection 3 mL (3 mLs Intravenous Given 10/30/19 0549)  morphine 4 MG/ML injection 4 mg (4 mg Intravenous Given 10/30/19 0551)  ondansetron (ZOFRAN) injection 4 mg (4  mg Intravenous Given 10/30/19 0550)   sodium chloride 0.9 % bolus 1,000 mL (0 mLs Intravenous Stopped 10/30/19 0700)  iohexol (OMNIPAQUE) 300 MG/ML solution 100 mL (100 mLs Intravenous Contrast Given 10/30/19 0641)     The patient appears reasonably screen and/or stabilized for discharge and I doubt any other medical condition or other [facility] requiring further screening, evaluation, or treatment in the ED at this time prior to discharge.   Final Clinical Impression(s) / ED Diagnoses Final diagnoses:  Low back pain  Lower abdominal pain    Rx / DC Orders ED Discharge Orders    None       Deno Etienne, DO 10/30/19 [phone]

## 2019-10-30 NOTE — ED Triage Notes (Signed)
Patien is complaining of right and lower back pain. He also complains of stomach pain and headache around his front eye.

## 2019-10-30 NOTE — Discharge Instructions (Signed)
Take 8 scoops of miralax in 32oz of whatever you would like to drink.(Gatorade comes in this size) You can also use a fleets enema which you can buy over the counter at the pharmacy.  Return for worsening abdominal pain, vomiting or fever. ? ?

## 2019-11-02 ENCOUNTER — Telehealth: Payer: Self-pay | Admitting: Cardiology

## 2019-11-02 ENCOUNTER — Telehealth: Payer: Self-pay

## 2019-11-02 NOTE — Telephone Encounter (Addendum)
   [employer] calling to report monitor result, disconnected call prior to transfer

## 2019-11-02 NOTE — Telephone Encounter (Signed)
Follow up     [employer] is calling back with Critical EKG results

## 2019-11-02 NOTE — Telephone Encounter (Signed)
I spoke to the patient who had a Critical Monitor event from [employer] on 12/18 @ 8:20 am.  It indicated a 9 beat run of V-Tach, but when speaking to the patient, he said that he was using the bathroom and was changing out the batteries at that time.  I reviewed this with [name], PA who ordered it for A Fib detection and he recommended to continue monitoring to determine NSVT vs Artifact as documented on printout.

## 2019-11-27 ENCOUNTER — Other Ambulatory Visit: Payer: Self-pay | Admitting: Cardiology

## 2019-11-27 DIAGNOSIS — I440 Atrioventricular block, first degree: Secondary | ICD-10-CM

## 2019-11-27 DIAGNOSIS — R001 Bradycardia, unspecified: Secondary | ICD-10-CM

## 2019-12-10 ENCOUNTER — Other Ambulatory Visit: Payer: Self-pay | Admitting: Cardiology

## 2020-01-31 ENCOUNTER — Ambulatory Visit (INDEPENDENT_AMBULATORY_CARE_PROVIDER_SITE_OTHER): Payer: Medicare Other | Admitting: Podiatry

## 2020-01-31 ENCOUNTER — Encounter: Payer: Self-pay | Admitting: Podiatry

## 2020-01-31 ENCOUNTER — Other Ambulatory Visit: Payer: Self-pay

## 2020-01-31 VITALS — BP 141/73 | HR 64 | Temp 97.4°F | Resp 14

## 2020-01-31 DIAGNOSIS — L600 Ingrowing nail: Secondary | ICD-10-CM | POA: Diagnosis not present

## 2020-01-31 DIAGNOSIS — M79675 Pain in left toe(s): Secondary | ICD-10-CM

## 2020-01-31 MED ORDER — CEPHALEXIN 500 MG PO CAPS: 500.0000 mg | ORAL_CAPSULE | Freq: Three times a day (TID) | ORAL | 0 refills | Status: DC

## 2020-01-31 NOTE — Patient Instructions (Signed)

## 2020-02-03 NOTE — Progress Notes (Signed)
Subjective: 79 year old male presents the office today for concerns of ingrown toenails of his left big toe lateral nail border.  He states that he was doing well however recently the nails come back and causing discomfort.  He has pulled a piece of skin off of the nail which did not help.  No drainage or pus. Denies any systemic complaints such as fevers, chills, nausea, vomiting. No acute changes since last appointment, and no other complaints at this time.   Objective: AAO x3, NAD DP/PT pulses palpable bilaterally, CRT less than 3 seconds Incurvation present to lateral aspect left hallux toenail with tenderness palpation.  There is localized edema.  There is no drainage or pus and no ascending cellulitis.  No open lesions or pre-ulcerative lesions.  No pain with calf compression, swelling, warmth, erythema  Assessment: Left lateral hallux symptomatic ingrown toenail  Plan: -All treatment options discussed with the patient including all alternatives, risks, complications.  -At this time, the patient is requesting partial nail removal with chemical matricectomy to the symptomatic portion of the nail. Risks and complications were discussed with the patient for which they understand and written consent was obtained. Under sterile conditions a total of 3 mL of a mixture of 2% lidocaine plain and 0.5% Marcaine plain was infiltrated in a hallux block fashion. Once anesthetized, the skin was prepped in sterile fashion. A tourniquet was then applied. Next the lateral aspect of hallux nail border was then sharply excised making sure to remove the entire offending nail border. Once the nails were ensured to be removed area was debrided and the underlying skin was intact. There is no purulence identified in the procedure. Next phenol was then applied under standard conditions and copiously irrigated. Silvadene was applied. A dry sterile dressing was applied. After application of the dressing the tourniquet was  removed and there is found to be an immediate capillary refill time to the digit. The patient tolerated the procedure well any complications. Post procedure instructions were discussed the patient for which he verbally understood. Follow-up in one week for nail check or sooner if any problems are to arise. Discussed signs/symptoms of infection and directed to call the office immediately should any occur or go directly to the emergency room. In the meantime, encouraged to call the office with any questions, concerns, changes symptoms. -Keflex -Patient encouraged to call the office with any questions, concerns, change in symptoms.   Trula Slade DPM

## 2020-02-12 ENCOUNTER — Ambulatory Visit (INDEPENDENT_AMBULATORY_CARE_PROVIDER_SITE_OTHER): Payer: Medicare Other | Admitting: Podiatry

## 2020-02-12 ENCOUNTER — Encounter: Payer: Self-pay | Admitting: Podiatry

## 2020-02-12 ENCOUNTER — Other Ambulatory Visit: Payer: Self-pay

## 2020-02-12 VITALS — Temp 97.7°F

## 2020-02-12 DIAGNOSIS — L600 Ingrowing nail: Secondary | ICD-10-CM

## 2020-02-18 DIAGNOSIS — L600 Ingrowing nail: Secondary | ICD-10-CM | POA: Insufficient documentation

## 2020-02-18 DIAGNOSIS — H401131 Primary open-angle glaucoma, bilateral, mild stage: Secondary | ICD-10-CM | POA: Diagnosis not present

## 2020-02-18 DIAGNOSIS — H2512 Age-related nuclear cataract, left eye: Secondary | ICD-10-CM | POA: Diagnosis not present

## 2020-02-18 NOTE — Progress Notes (Signed)
Subjective: Bob Elliott is a 79 y.o.  male returns to office today for follow up evaluation after having left Hallux lateral partial nail avulsion performed. Patient has been soaking using epsom salts and applying topical antibiotic covered with bandaid daily.  He said the toes feeling great he has no pain or any other concerns.  Patient denies fevers, chills, nausea, vomiting. Denies any calf pain, chest pain, SOB.   Objective:   General: Well developed, nourished, in no acute distress, alert and oriented x3   Dermatology: Skin is warm, dry and supple bilateral. LEFT hallux nail border appears to be clean, dry, with mild granular tissue and surrounding scab. There is no surrounding erythema, edema, drainage/purulence. The remaining nails appear unremarkable at this time. There are no other lesions or other signs of infection present.  Neurovascular status: Intact. No lower extremity swelling; No pain with calf compression bilateral.  Musculoskeletal: No tenderness to palpation of the lateral hallux nail fold. Muscular strength within normal limits bilateral.   Assesement and Plan: S/p partial nail avulsion, doing well.   -Continue soaking in epsom salts twice a day followed by antibiotic ointment and a band-aid. Can leave uncovered at night. Continue this until completely healed.  -If the area has not healed in 2 weeks, call the office for follow-up appointment, or sooner if any problems arise.  -Monitor for any signs/symptoms of infection. Call the office immediately if any occur or go directly to the emergency room. Call with any questions/concerns.  Celesta Gentile, DPM

## 2020-02-19 NOTE — Progress Notes (Signed)
Cardiology Office Note:    Date:  02/20/2020   ID:  Bob [facility], DOB 1940-12-28, MRN [id number]  PCP:  [name], MD  Cardiologist:  [name], MD  Electrophysiologist:  None   Referring MD: [name], MD   Reason for visit: 5 months follow-up  History of Present Illness:    Bob [facility] is a 79 y.o. 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 Glendinning was seen by Melina Copa, PA-C via Telemedicine in 03/2019.  He was admitted 11/4-11/6 with transient right eye vision loss (amaurosis fugax).  MRI/MRA, head/neck CTA and carotid Dopplers were unremarkable.  He was followed by neurology.  Antiplatelet therapy (Clopidogrel) was recommended as well as atorvastatin.  Outpatient ophthalmology follow-up was also recommended.  He was followed by [name] in November 2020, at that point his symptoms have resolved.  He returns for follow-up.  He is here alone.  He has seen his ophthalmologist.  He is not certain about the findings obtained by the ophthalmologist.   02/20/2020 -the patient is coming after 6 months, he has been noticing worsening exercise tolerance and dyspnea on exertion.  He describes situations like when he tries to mow his grass and he has to stop after 5 to 10 minutes.  No chest pain.  Occasional dizziness but no falls.  No palpitations.  He has been compliant with his medications and states that he has been feeling more tired since he was started on Lipitor.  Prior CV studies:   The following studies were reviewed today:  Echocardiogram 09/20/2019 EF 60-65, mild LVH, Gr 1 DD, no RWMA, mild AI, Ao root 41 mm   Carotid US 09/20/2019 Summary: Right Carotid: Velocities in the right ICA are consistent with a 1-39% stenosis. Left Carotid: Velocities in the left ICA are consistent with a 1-39% stenosis. Vertebrals: Bilateral vertebral arteries demonstrate antegrade flow.  Chest CTA 03/26/2019 IMPRESSION: 1. No significant change in caliber of the thoracic aorta, measuring approximately 4.1 x 4.0 cm in the tubular ascending thoracic aorta, previously 4.3 x 4.2 cm when measured similarly. The aortic root is normal in caliber. The sinuses of Valsalva are enlarged up to 4.3 cm. The distal aortic arch and descending thoracic aorta are normal in caliber. Minimal atherosclerosis.  2. Diffuse bilateral bronchial wall thickening, consistent with nonspecific infectious or inflammatory bronchitis.  Myoview 09/06/16  Nuclear stress EF: 53%.  Blood pressure demonstrated a normal response to exercise.  There was no ST segment deviation noted during stress.  This is a low risk study.  The left ventricular ejection fraction is mildly decreased (45-54%).  Inferior and apical thinning consistent with soft tissue attenuaion (diaphragm), cannot exclude small subendocardial scar No ichemia  Echocardiogram 09/06/16 Mild septal hypertrophy with mod post wall hypertrophy, EF 55-60, no RWMA, Gr 1 DD, mild AI, trivial MR, mild RAE, GLS -17.2%  Past Medical History:  Diagnosis Date  . Arthritis   . BPH (benign prostatic hyperplasia)   . CKD (chronic kidney disease), stage II    a. CKD II-III by labs.  . Dizziness 10/21/2016  . Esophageal dysmotility   .  Essential hypertension    Trial off acei 08/12/2017 for unexplained sob/ noct smothering   . GERD (gastroesophageal reflux disease)   . GI bleed   . Glaucoma    BOTH EYES  . Hyperlipidemia   . Hypertension   . Inguinal hernia without mention of obstruction or gangrene, unilateral or unspecified, (not specified as recurrent)-right s/p repair with mesh  05/21/2014  . Schatzki's ring   . Sinus bradycardia    a. prompting dc of Diltiazem (?chronotropic incompetence) in 2017.  [name] Sleep apnea    "mild" no cpap  . Thoracic aortic aneurysm (TAA) ([place])    Surgical Hx: The patient  has a past surgical history that includes Neck surgery (2000); Tonsillectomy; Eye surgery; Hernia repair; Inguinal hernia repair (Right, 06/18/2014); Insertion of mesh (Right, 06/18/2014); and Colonoscopy with propofol (N/A, 05/11/2016).   Current Medications: Current Meds  Medication Sig  . amLODipine (NORVASC) 10 MG tablet TAKE 1 TABLET DAILY  . doxazosin (CARDURA) 4 MG tablet Take 4 mg by mouth at bedtime.   [name] esomeprazole (NEXIUM) 40 MG capsule Take 30-60 min before first meal of the day  . fluticasone (FLONASE) 50 MCG/ACT nasal spray Place 2 sprays into both nostrils daily as needed for allergies or rhinitis.   [name] irbesartan (AVAPRO) 150 MG tablet TAKE 1 TABLET DAILY (DOSE DECREASE)  . loratadine (CLARITIN) 10 MG tablet Take 10 mg by mouth daily.  . potassium chloride (KLOR-CON) 10 MEQ tablet Take 30 mEq by mouth 2 (two) times daily.  [name] spironolactone (ALDACTONE) 25 MG tablet TAKE ONE-HALF (1/2) TABLET DAILY  . timolol (TIMOPTIC) 0.5 % ophthalmic solution Place 1 drop into both eyes daily.   . [DISCONTINUED] atorvastatin (LIPITOR) 20 MG tablet Take 1 tablet (20 mg total) by mouth daily at 6 PM.     Allergies:   Aspirin, Codeine, and Shellfish allergy   Social History   Tobacco Use  . Smoking status: Never Smoker  . Smokeless tobacco: Never Used  Substance Use Topics  . Alcohol use: Yes    Comment: none since 80's  . Drug use: No     Family Hx: The patient's family history includes Cancer in his brother; Diabetes in his sister; Heart disease in his mother; Leukemia in his maternal grandmother; Stroke in his father.  ROS:   Please see the history of present illness.    ROS All other systems reviewed and are negative.   EKGs/Labs/Other Test Reviewed:     EKG:  EKG is  ordered today.  The ekg ordered today demonstrates normal sinus rhythm, heart rate 60 bpm, normal EKG, unchanged from prior, personally reviewed.    Recent Labs: 10/30/2019: ALT 20; BUN 15; Creatinine, Ser 1.20; Hemoglobin 15.6; Platelets 182; Potassium 4.2; Sodium 138   Recent Lipid Panel Lab Results  Component Value Date/Time   CHOL 93 (L) 10/16/2019 02:18 PM   TRIG 139 10/16/2019 02:18 PM   HDL 42 10/16/2019 02:18 PM   CHOLHDL 2.2 10/16/2019 02:18 PM   CHOLHDL 4.0 09/20/2019 07:40 AM   LDLCALC 27 10/16/2019 02:18 PM    Physical Exam:    VS:  BP 124/70   Pulse 66   Ht 5' 8" (1.727 m)   Wt 213 lb 3.2 oz (96.7 kg)   SpO2 97%   BMI 32.42 kg/m     Wt Readings from Last 3 Encounters:  02/20/20 213 lb 3.2 oz (96.7 kg)  10/30/19 210 lb (95.3 kg)  10/16/19 211 lb (95.7 kg)  Physical Exam  Constitutional: He is oriented to person, place, and time. He appears well-developed and well-nourished. No distress.  HENT:  Head: Normocephalic and atraumatic.  Eyes: No scleral icterus.  Neck: No JVD present. No thyromegaly present.  Cardiovascular: Normal rate and regular rhythm.  No murmur heard. Pulmonary/Chest: Effort normal. He has no rales.  Abdominal: Soft. He exhibits no distension.  Musculoskeletal:        General: No edema.  Lymphadenopathy:    He has no cervical adenopathy.  Neurological: He is alert and oriented to person, place, and time.  Skin: Skin is warm and dry.  Psychiatric: He has a normal mood and affect.    ASSESSMENT & PLAN:    Dyspnea on exertion -We will obtain Lexiscan nuclear stress test -He is intolerant to aspirin we will start Plavix especially that he had TIA  Amaurosis fugax - resolved, he was evaluated by neurology - continue Plavix - he underwent 30-day event monitor that showed Sinus bradycardia to sinus rhythm. Two very short runs of nsVT - 4 and 9 beats. No atrial fibrillation.  Thoracic aortic aneurysm without rupture  (HCC) 4.1 cm by CT in May 2020. This is still within upper normal limit, I would chcek every 2-3 years, especially given CKD stage 3.  Essential hypertension The patient's blood pressure is controlled on his current regimen.  Continue current therapy.   Hyperlipidemia, unspecified hyperlipidemia type -We will repeat lipids and CMP today, will decrease atorvastatin to 10 mg daily.  5. Sinus bradycardia Heart rate today is in the 60s.  6. Chronic kidney disease (CKD), stage II (mild) Recent creatinine 1.4.   Dispo: Follow-up in 2 months.  Medication Adjustments/Labs and Tests Ordered: Current medicines are reviewed at length with the patient today.  Concerns regarding medicines are outlined above.  Tests Ordered: Orders Placed This Encounter  Procedures  . Comp Met (CMET)  . Lipid Profile  . Myocardial Perfusion Imaging  . EKG 12-Lead   Medication Changes: Meds ordered this encounter  Medications  . clopidogrel (PLAVIX) 75 MG tablet    Sig: Take 1 tablet (75 mg total) by mouth daily.    Dispense:  90 tablet    Refill:  3  . atorvastatin (LIPITOR) 10 MG tablet    Sig: Take 1 tablet (10 mg total) by mouth daily.    Dispense:  90 tablet    Refill:  1    Dose decreased    Signed, [name], MD  02/20/2020 9:22 AM    [facility] [place], [place], [place]  [address] Phone: [phone]; Fax: [phone]

## 2020-02-20 ENCOUNTER — Other Ambulatory Visit: Payer: Self-pay

## 2020-02-20 ENCOUNTER — Encounter: Payer: Self-pay | Admitting: *Deleted

## 2020-02-20 ENCOUNTER — Ambulatory Visit (INDEPENDENT_AMBULATORY_CARE_PROVIDER_SITE_OTHER): Payer: Medicare Other | Admitting: Cardiology

## 2020-02-20 ENCOUNTER — Encounter: Payer: Self-pay | Admitting: Cardiology

## 2020-02-20 ENCOUNTER — Telehealth (HOSPITAL_COMMUNITY): Payer: Self-pay | Admitting: *Deleted

## 2020-02-20 VITALS — BP 124/70 | HR 66 | Ht 68.0 in | Wt 213.2 lb

## 2020-02-20 DIAGNOSIS — G453 Amaurosis fugax: Secondary | ICD-10-CM | POA: Diagnosis not present

## 2020-02-20 DIAGNOSIS — R0609 Other forms of dyspnea: Secondary | ICD-10-CM | POA: Diagnosis not present

## 2020-02-20 DIAGNOSIS — I10 Essential (primary) hypertension: Secondary | ICD-10-CM

## 2020-02-20 DIAGNOSIS — I712 Thoracic aortic aneurysm, without rupture, unspecified: Secondary | ICD-10-CM

## 2020-02-20 DIAGNOSIS — R079 Chest pain, unspecified: Secondary | ICD-10-CM | POA: Diagnosis not present

## 2020-02-20 DIAGNOSIS — R0600 Dyspnea, unspecified: Secondary | ICD-10-CM | POA: Diagnosis not present

## 2020-02-20 DIAGNOSIS — E785 Hyperlipidemia, unspecified: Secondary | ICD-10-CM | POA: Diagnosis not present

## 2020-02-20 LAB — LIPID PANEL
Chol/HDL Ratio: 2.1 ratio (ref 0.0–5.0)
Cholesterol, Total: 96 mg/dL — ABNORMAL LOW (ref 100–199)
HDL: 46 mg/dL (ref >39)
LDL Chol Calc (NIH): 34 mg/dL (ref 0–99)
Triglycerides: 75 mg/dL (ref 0–149)
VLDL Cholesterol Cal: 16 mg/dL (ref 5–40)

## 2020-02-20 LAB — COMPREHENSIVE METABOLIC PANEL
ALT: 19 IU/L (ref 0–44)
AST: 17 IU/L (ref 0–40)
Albumin/Globulin Ratio: 1.8 (ref 1.2–2.2)
Albumin: 4.1 g/dL (ref 3.7–4.7)
Alkaline Phosphatase: 75 IU/L (ref 39–117)
BUN/Creatinine Ratio: 12 (ref 10–24)
BUN: 15 mg/dL (ref 8–27)
Bilirubin Total: 0.5 mg/dL (ref 0.0–1.2)
CO2: 25 mmol/L (ref 20–29)
Calcium: 9.1 mg/dL (ref 8.6–10.2)
Chloride: 104 mmol/L (ref 96–106)
Creatinine, Ser: 1.23 mg/dL (ref 0.76–1.27)
GFR calc Af Amer: 64 mL/min/{1.73_m2} (ref >59)
GFR calc non Af Amer: 55 mL/min/{1.73_m2} — ABNORMAL LOW (ref >59)
Globulin, Total: 2.3 g/dL (ref 1.5–4.5)
Glucose: 108 mg/dL — ABNORMAL HIGH (ref 65–99)
Potassium: 4.3 mmol/L (ref 3.5–5.2)
Sodium: 140 mmol/L (ref 134–144)
Total Protein: 6.4 g/dL (ref 6.0–8.5)

## 2020-02-20 MED ORDER — CLOPIDOGREL BISULFATE 75 MG PO TABS: 75.0000 mg | ORAL_TABLET | Freq: Every day | ORAL | 3 refills | Status: DC

## 2020-02-20 MED ORDER — ATORVASTATIN CALCIUM 10 MG PO TABS: 10.0000 mg | ORAL_TABLET | Freq: Every day | ORAL | 1 refills | Status: DC

## 2020-02-20 NOTE — Telephone Encounter (Signed)
Patient given detailed instructions per Myocardial Perfusion Study Information Sheet for the test on 02/26/20 at 7:45. Patient notified to arrive 15 minutes early and that it is imperative to arrive on time for appointment to keep from having the test rescheduled.  If you need to cancel or reschedule your appointment, please call the office within 24 hours of your appointment. . Patient verbalized understanding.Bob [facility]

## 2020-02-20 NOTE — Patient Instructions (Signed)
Medication Instructions:   STOP TAKING ASPIRIN NOW  START TAKING PLAVIX (CLOPIDEGRIL) 75 MG BY MOUTH DAILY  DECREASE YOUR ATORVASTATIN (LIPITOR) TO 10 MG BY MOUTH DAILY  *If you need a refill on your cardiac medications before your next appointment, please call your pharmacy*   Lab Work:  TODAY--CMET AND LIPIDS  If you have labs (blood work) drawn today and your tests are completely normal, you will receive your results only by: [name] MyChart Message (if you have MyChart) OR . A paper copy in the mail If you have any lab test that is abnormal or we need to change your treatment, we will call you to review the results.  Testing/Procedures:  Your physician has requested that you have a lexiscan myoview. For further information please visit [email or web address]. Please follow instruction sheet, as given.  PLEASE DO ON D-SPECT PER DR. Meda Coffee    Follow-Up:  2 MONTHS WITH AN EXTENDER IN OUR OFFICE--IN PERSON VISIT

## 2020-02-21 ENCOUNTER — Telehealth (HOSPITAL_COMMUNITY): Payer: Self-pay

## 2020-02-21 ENCOUNTER — Encounter (HOSPITAL_COMMUNITY): Payer: Self-pay

## 2020-02-21 NOTE — Telephone Encounter (Signed)
Attempted to contact the patient, but there was no answer. I will attempt to send a letter to my chart about this procedure. S.Lonie Newsham EMTP

## 2020-02-26 ENCOUNTER — Ambulatory Visit (HOSPITAL_COMMUNITY): Payer: Medicare Other | Attending: Cardiovascular Disease

## 2020-02-26 ENCOUNTER — Telehealth: Payer: Self-pay | Admitting: *Deleted

## 2020-02-26 ENCOUNTER — Other Ambulatory Visit: Payer: Self-pay

## 2020-02-26 DIAGNOSIS — R079 Chest pain, unspecified: Secondary | ICD-10-CM | POA: Diagnosis not present

## 2020-02-26 DIAGNOSIS — E785 Hyperlipidemia, unspecified: Secondary | ICD-10-CM | POA: Diagnosis not present

## 2020-02-26 DIAGNOSIS — R0600 Dyspnea, unspecified: Secondary | ICD-10-CM | POA: Insufficient documentation

## 2020-02-26 DIAGNOSIS — R0609 Other forms of dyspnea: Secondary | ICD-10-CM

## 2020-02-26 LAB — MYOCARDIAL PERFUSION IMAGING
LV dias vol: 95 mL (ref 62–150)
LV sys vol: 40 mL
Peak HR: 82 {beats}/min
Rest HR: 58 {beats}/min
SDS: 3
SRS: 0
SSS: 3
TID: 0.91

## 2020-02-26 MED ORDER — TECHNETIUM TC 99M TETROFOSMIN IV KIT
10.4000 | PACK | Freq: Once | INTRAVENOUS | Status: AC | PRN
  Administered 2020-02-26: 10.4 via INTRAVENOUS
  Filled 2020-02-26: qty 11

## 2020-02-26 MED ORDER — TECHNETIUM TC 99M TETROFOSMIN IV KIT
32.6000 | PACK | Freq: Once | INTRAVENOUS | Status: AC | PRN
  Administered 2020-02-26: 32.6 via INTRAVENOUS
  Filled 2020-02-26: qty 33

## 2020-02-26 MED ORDER — REGADENOSON 0.4 MG/5ML IV SOLN
0.4000 mg | Freq: Once | INTRAVENOUS | Status: AC
  Administered 2020-02-26: 0.4 mg via INTRAVENOUS

## 2020-02-26 NOTE — Telephone Encounter (Signed)
Spoke with the pt and informed him of both Dr. Meda Coffee and our Pharmacist [facility] Maccia's recommendations.  Advised the pt that he can reach out to Dr. Melvyn Novas, and have him advise on Protonix, if he felt better having this switched.  Pt states he will stay on his current regimen.  Pt gracious for all the assistance provided.

## 2020-02-26 NOTE — Telephone Encounter (Signed)
He can take both

## 2020-02-26 NOTE — Telephone Encounter (Signed)
-----   Message from Nuala Alpha, LPN sent at [id number]  1:51 PM EDT -----  ----- Message ----- From: Dorothy Spark, MD Sent: 02/26/2020   1:45 PM EDT To: Nuala Alpha, LPN  No ischemia, normal LVEF.

## 2020-02-26 NOTE — Telephone Encounter (Signed)
Nexium could potentially decrease the efficacy of Plavix. Protonix or rabeprazole are though to be safer options.  If he is not able to switch PPI, then I agree with Dr. Meda Coffee that its probably fine to take both.

## 2020-02-26 NOTE — Telephone Encounter (Signed)
Bob [facility], is Nexium contraindicated while taking Plavix? Pharmacist advised pt to reach out to [place] to advise on this.

## 2020-02-26 NOTE — Telephone Encounter (Signed)
Pt has been notified of Myoview results by phone with verbal understanding. Pt states to me he was told by the pharmacist when he went and picked up his Rx for Plavix that he cannot take the Nexium with the Plavix and was told to call his cardiologist. Looks like Dr. Melvyn Novas had ordered the Nexium though I assured the pt that I will send a message to Dr. Francesca Oman nurse Karlene Einstein to d/w Dr. Meda Coffee for further advice and Karlene Einstein will call him back with recommendations. Pt thanked me for the call and the help. The patient has been notified of the result and verbalized understanding. All questions (if any) were answered. Julaine Hua, [facility] 02/26/2020 2:41 PM

## 2020-03-28 DIAGNOSIS — I712 Thoracic aortic aneurysm, without rupture: Secondary | ICD-10-CM | POA: Diagnosis not present

## 2020-03-28 DIAGNOSIS — K219 Gastro-esophageal reflux disease without esophagitis: Secondary | ICD-10-CM | POA: Diagnosis not present

## 2020-03-28 DIAGNOSIS — E782 Mixed hyperlipidemia: Secondary | ICD-10-CM | POA: Diagnosis not present

## 2020-03-28 DIAGNOSIS — E876 Hypokalemia: Secondary | ICD-10-CM | POA: Diagnosis not present

## 2020-03-28 DIAGNOSIS — M5090 Cervical disc disorder, unspecified, unspecified cervical region: Secondary | ICD-10-CM | POA: Diagnosis not present

## 2020-03-28 DIAGNOSIS — I10 Essential (primary) hypertension: Secondary | ICD-10-CM | POA: Diagnosis not present

## 2020-03-28 DIAGNOSIS — R7309 Other abnormal glucose: Secondary | ICD-10-CM | POA: Diagnosis not present

## 2020-03-28 DIAGNOSIS — Z1389 Encounter for screening for other disorder: Secondary | ICD-10-CM | POA: Diagnosis not present

## 2020-03-28 DIAGNOSIS — J309 Allergic rhinitis, unspecified: Secondary | ICD-10-CM | POA: Diagnosis not present

## 2020-03-28 DIAGNOSIS — M109 Gout, unspecified: Secondary | ICD-10-CM | POA: Diagnosis not present

## 2020-03-28 DIAGNOSIS — M1990 Unspecified osteoarthritis, unspecified site: Secondary | ICD-10-CM | POA: Diagnosis not present

## 2020-03-28 DIAGNOSIS — H409 Unspecified glaucoma: Secondary | ICD-10-CM | POA: Diagnosis not present

## 2020-03-28 DIAGNOSIS — N182 Chronic kidney disease, stage 2 (mild): Secondary | ICD-10-CM | POA: Diagnosis not present

## 2020-03-28 DIAGNOSIS — Z0000 Encounter for general adult medical examination without abnormal findings: Secondary | ICD-10-CM | POA: Diagnosis not present

## 2020-04-16 NOTE — Progress Notes (Signed)
Cardiology Office Note    Date:  04/29/2020   ID:  Bob [facility], DOB 08-30-1941, MRN [id number]  PCP:  [name], MD  Cardiologist: [name], MD EPS: None  Chief Complaint  Patient presents with  . Follow-up    History of Present Illness:  Bob [facility] is a 79 y.o. male with history of Thoracic aortic aneurysm 4.1 cm 03/2019, HTN, HLD, sinus bradycardia, CKD II, amaurosis fugax on plavix-30 day monitor SB-NSR short runs NSVT 4 and 9 beats-no afib.  Saw Dr. Meda Coffee 02/20/20 complaining of DOE and exercise intolerance, more tired on lipitor. Myoview normal LV function and no [email or web address] to ASA so started on Plavix b/c of recent TIA.  Patient comes in for f/u. DOE with activity but he thinks it's from his age. He rides exercise bike 15-20 min 1-2 times daily, fishing without trouble. Was in [place] last week and did a lot of walking without trouble. Overall he feels like he's doing well for his age. Has gained some weight due to the pandemic. Accidentally took 2 Plavix one day last week but skipped the next day.  Past Medical History:  Diagnosis Date  . Arthritis   . BPH (benign prostatic hyperplasia)   . CKD (chronic kidney disease), stage II    a. CKD II-III by labs.  . Dizziness 10/21/2016  . Esophageal dysmotility   . Essential hypertension    Trial off acei 08/12/2017 for unexplained sob/ noct smothering   . GERD (gastroesophageal reflux disease)   . GI bleed   . Glaucoma    BOTH EYES  . Hyperlipidemia   . Hypertension   . Inguinal hernia without mention of obstruction or gangrene, unilateral or unspecified, (not specified as recurrent)-right s/p repair with mesh 05/21/2014  . Schatzki's ring   . Sinus bradycardia    a. prompting dc of Diltiazem (?chronotropic incompetence) in 2017.  [name] Sleep apnea    "mild" no cpap  . Thoracic aortic aneurysm (TAA) ([place])     Past Surgical History:  Procedure Laterality Date  . COLONOSCOPY WITH PROPOFOL N/A  05/11/2016   Procedure: COLONOSCOPY WITH PROPOFOL;  Surgeon: Garlan Fair, MD;  Location: [facility];  Service: Endoscopy;  Laterality: N/A;  . EYE SURGERY     CATARACT R  EYE  . HERNIA REPAIR     UMBILICAL  . INGUINAL HERNIA REPAIR Right 06/18/2014   Procedure: RIGHT INGUINAL HERNIA REPAIR;  Surgeon: Odis Hollingshead, MD;  Location: [facility];  Service: General;  Laterality: Right;  . INSERTION OF MESH Right 06/18/2014   Procedure: INSERTION OF MESH;  Surgeon: Odis Hollingshead, MD;  Location: [facility];  Service: General;  Laterality: Right;  . NECK SURGERY  2000   cervical disc surgery(limited ROM)- retained hardware  . TONSILLECTOMY      Current Medications: Current Meds  Medication Sig  . amLODipine (NORVASC) 10 MG tablet TAKE 1 TABLET DAILY  . atorvastatin (LIPITOR) 10 MG tablet Take 1 tablet (10 mg total) by mouth 3 (three) times a week.  . clopidogrel (PLAVIX) 75 MG tablet Take 1 tablet (75 mg total) by mouth daily.  [name] doxazosin (CARDURA) 4 MG tablet Take 4 mg by mouth at bedtime.   . fluticasone (FLONASE) 50 MCG/ACT nasal spray Place 2 sprays into both nostrils daily as needed for allergies or rhinitis.   [name] irbesartan (AVAPRO) 150 MG tablet TAKE 1 TABLET DAILY (DOSE DECREASE)  . loratadine (CLARITIN) 10 MG tablet Take 10 mg by  mouth daily.  . potassium chloride (KLOR-CON) 10 MEQ tablet Take 30 mEq by mouth 2 (two) times daily.  [name] spironolactone (ALDACTONE) 25 MG tablet TAKE ONE-HALF (1/2) TABLET DAILY  . timolol (TIMOPTIC) 0.5 % ophthalmic solution Place 1 drop into both eyes daily.      Allergies:   Aspirin, Codeine, and Shellfish allergy   Social History   Socioeconomic History  . Marital status: Married    Spouse name: Not on file  . Number of children: 4  . Years of education: Not on file  . Highest education level: Not on file  Occupational History  . Occupation: retired  Tobacco Use  . Smoking status: Never Smoker  . Smokeless tobacco: Never Used  Vaping Use  .  Vaping Use: Never used  Substance and Sexual Activity  . Alcohol use: Yes    Comment: none since 80's  . Drug use: No  . Sexual activity: Not Currently  Other Topics Concern  . Not on file  Social History Narrative  . Not on file   Social Determinants of Health   Financial Resource Strain:   . Difficulty of Paying Living Expenses:   Food Insecurity:   . Worried About Charity fundraiser in the Last Year:   . Arboriculturist in the Last Year:   Transportation Needs:   . Film/video editor (Medical):   [name] Lack of Transportation (Non-Medical):   Physical Activity:   . Days of Exercise per Week:   . Minutes of Exercise per Session:   Stress:   . Feeling of Stress :   Social Connections:   . Frequency of Communication with Friends and Family:   . Frequency of Social Gatherings with Friends and Family:   . Attends Religious Services:   . Active Member of Clubs or Organizations:   . Attends Archivist Meetings:   [name] Marital Status:      Family History:  The patient's family history includes Cancer in his brother; Diabetes in his sister; Heart disease in his mother; Leukemia in his maternal grandmother; Stroke in his father.   ROS:   Please see the history of present illness.    ROS All other systems reviewed and are negative.   PHYSICAL EXAM:   VS:  BP (!) 120/54   Pulse 75   Ht 5\' 8"  (1.727 m)   Wt 211 lb (95.7 kg)   SpO2 95%   BMI 32.08 kg/m   Physical Exam  GEN: Well nourished, well developed, in no acute distress  Neck: no JVD, carotid bruits, or masses Cardiac:RRR; no murmurs, rubs, or gallops  Respiratory:  clear to auscultation bilaterally, normal work of breathing GI: soft, nontender, nondistended, + BS Ext: without cyanosis, clubbing, or edema, Good distal pulses bilaterally Neuro:  Alert and Oriented x 3 Psych: euthymic mood, full affect  Wt Readings from Last 3 Encounters:  04/29/20 211 lb (95.7 kg)  04/23/20 211 lb (95.7 kg)  02/26/20  213 lb (96.6 kg)      Studies/Labs Reviewed:   EKG:  EKG is not ordered today.    Recent Labs: 10/30/2019: Hemoglobin 15.6; Platelets 182 02/20/2020: ALT 19; BUN 15; Creatinine, Ser 1.23; Potassium 4.3; Sodium 140   Lipid Panel    Component Value Date/Time   CHOL 96 (L) 02/20/2020 0937   TRIG 75 02/20/2020 0937   HDL 46 02/20/2020 0937   CHOLHDL 2.1 02/20/2020 0937   CHOLHDL 4.0 09/20/2019 0740   VLDL 19  09/20/2019 0740   LDLCALC 34 02/20/2020 0937    Additional studies/ records that were reviewed today include:   NST 4/13/21Nuclear stress EF: 58%.  The left ventricular ejection fraction is normal (55-65%).  There was no ST segment deviation noted during stress.  This is a low risk study.   Apical thinning not thought to be significant No ischemia EF 58%     Echocardiogram 09/20/2019 EF 60-65, mild LVH, Gr 1 DD, no RWMA, mild AI, Ao root 41 mm   Carotid US 09/20/2019 Summary: Right Carotid: Velocities in the right ICA are consistent with a 1-39% stenosis. Left Carotid: Velocities in the left ICA are consistent with a 1-39% stenosis. Vertebrals: Bilateral vertebral arteries demonstrate antegrade flow.   Chest CTA 03/26/2019 IMPRESSION: 1. No significant change in caliber of the thoracic aorta, measuring approximately 4.1 x 4.0 cm in the tubular ascending thoracic aorta, previously 4.3 x 4.2 cm when measured similarly. The aortic root is normal in caliber. The sinuses of Valsalva are enlarged up to 4.3 cm. The distal aortic arch and descending thoracic aorta are normal in caliber. Minimal atherosclerosis.   2. Diffuse bilateral bronchial wall thickening, consistent with nonspecific infectious or inflammatory bronchitis.   Myoview 09/06/16  Nuclear stress EF: 53%.  Blood pressure demonstrated a normal response to exercise.  There was no ST segment deviation noted during stress.  This is a low risk study.  The left ventricular ejection fraction is mildly  decreased (45-54%).  Inferior and apical thinning consistent with soft tissue attenuaion (diaphragm), cannot exclude small subendocardial scar No ichemia   Echocardiogram 09/06/16 Mild septal hypertrophy with mod post wall hypertrophy, EF 55-60, no RWMA, Gr 1 DD, mild AI, trivial MR, mild RAE, GLS -17.2%       ASSESSMENT:    1. DOE (dyspnea on exertion)   2. Thoracic aortic aneurysm without rupture ([place])   3. Sinus bradycardia   4. Chronic kidney disease (CKD), stage II (mild)   5. Essential hypertension   6. Hyperlipidemia, unspecified hyperlipidemia type   7. Amaurosis fugax      PLAN:  In order of problems listed above:  Dyspnea on exertion-myoview 02/26/20 apical thinning no ischemia EF 58%-doing well and able to walk all over [place] without trouble last week  Thoracic aneurysm 4.1 cm 03/2019-will order f/u  Sinus bradycardia-HR 75 today  CKD stage 2 Crt 1.23 02/20/20  HTN BP stable  HLD LDL 34 02/20/20  Amaurosis fugax on plavix 30 day monitor without afib.No bleeding trouble.    Medication Adjustments/Labs and Tests Ordered: Current medicines are reviewed at length with the patient today.  Concerns regarding medicines are outlined above.  Medication changes, Labs and Tests ordered today are listed in the Patient Instructions below. There are no Patient Instructions on file for this visit.   Sumner Boast, PA-C  04/29/2020 10:11 AM    [facility] [place], [place], [place]  [address] Phone: [phone]; Fax: [phone]

## 2020-04-23 ENCOUNTER — Ambulatory Visit (INDEPENDENT_AMBULATORY_CARE_PROVIDER_SITE_OTHER): Payer: Medicare Other | Admitting: Adult Health

## 2020-04-23 ENCOUNTER — Encounter: Payer: Self-pay | Admitting: Adult Health

## 2020-04-23 VITALS — BP 124/70 | HR 67 | Ht 68.0 in | Wt 211.0 lb

## 2020-04-23 DIAGNOSIS — E785 Hyperlipidemia, unspecified: Secondary | ICD-10-CM

## 2020-04-23 DIAGNOSIS — G453 Amaurosis fugax: Secondary | ICD-10-CM | POA: Diagnosis not present

## 2020-04-23 DIAGNOSIS — I10 Essential (primary) hypertension: Secondary | ICD-10-CM

## 2020-04-23 DIAGNOSIS — G4733 Obstructive sleep apnea (adult) (pediatric): Secondary | ICD-10-CM

## 2020-04-23 MED ORDER — ATORVASTATIN CALCIUM 10 MG PO TABS: 10.0000 mg | ORAL_TABLET | ORAL | 3 refills | Status: DC

## 2020-04-23 NOTE — Progress Notes (Signed)
[facility] [address] [place]. [place] [address] [phone]       HOSPITAL FOLLOW UP NOTE  [name] Date of Birth:  Aug 21, 1941 Medical Record Number:  [id number]   Reason for Referral:  hospital stroke follow up    CHIEF COMPLAINT:  Chief Complaint  Patient presents with  . Follow-up    Transient visual loss 09/2019  . Headache    Chronic daily right frontal/periorbital headache  . Fatigue    Reports daytime fatigue present since initiation of atorvastatin    HPI:   Today, 04/23/2020, Bob Elliott returns for follow-up regarding episode of right amaurosis fugax in 09/2019.  He has been doing well since prior visit without reoccurring visual symptoms and denies new stroke/TIA symptoms.  He does report ongoing daily right frontal/periorbital headaches which have been ongoing since his hospitalization.  It is a dull pressure type pain that does not last long and subsides on its own.  It is not associated with any other symptoms such as visual changes, photophobia, or phonophobia.  He also reports daytime fatigue.  Documented history of OSA, mild, not on CPAP.  Patient reports having sleep study completed approximately 15 years ago.  He feels headaches and fatigue are both due to side effects of atorvastatin.  He questions ongoing need and indication for both atorvastatin and clopidogrel.  He has continuesd on clopidogrel and atorvastatin 10 mg daily for secondary stroke prevention.  Recent lipid panel showed LDL 34.  Blood pressure today 124/70. No further concerns at this time.   History provided for reference purposes only Initial visit 10/16/2019: Bob Elliott is a 79 year old male who is being seen today for hospital follow-up.  He has been doing well since discharge without reoccurring visual symptoms.  He did have follow-up with ophthalmology since discharge but is unsure of findings.  He is routinely monitored at [facility] ophthalmology.  He has  continued on Plavix and atorvastatin but questions ongoing need of medications.  Blood pressure today 118/74.  Continues to follow with cardiology regularly.  No further concerns at this time.  Hospital admission 09/20/2019: Bob Elliott is a 79 y.o. male with history of HTN, HLD, OSA, aortic aneurysm, CKD  presented on 09/20/2019 with transient vision loss affecting his right eye x 5-7 mins.   CT head and MRI unremarkable for acute infarct.  MRA showed mild intracranial atherosclerosis.  CTA neck negative.  Carotid Doppler bilateral ICA 1- 39% stenosis.  2D echo showed an EF of 60 to 65%.  Symptoms likely related to right amaurosis fugax.  Recommended clopidogrel 75 mg daily as patient has known aspirin allergy.  LDL 113 and initiated atorvastatin 40 mg daily.  No prior history of evidence of DM with A1c 5.4.  HTN stable.  Other stroke risk factors include advanced age, EtOH use, obesity, family history of stroke and OSA mild not on CPAP but no prior history of stroke.  Other active problems include CKD stage II.  Discharged home in stable condition recommendation of ophthalmology follow-up.     ROS:   14 system review of systems performed and negative with exception of headache, fatigue  PMH:  Past Medical History:  Diagnosis Date  . Arthritis   . BPH (benign prostatic hyperplasia)   . CKD (chronic kidney disease), stage II    a. CKD II-III by labs.  . Dizziness 10/21/2016  . Esophageal dysmotility   . Essential hypertension    Trial off acei 08/12/2017 for  unexplained sob/ noct smothering   . GERD (gastroesophageal reflux disease)   . GI bleed   . Glaucoma    BOTH EYES  . Hyperlipidemia   . Hypertension   . Inguinal hernia without mention of obstruction or gangrene, unilateral or unspecified, (not specified as recurrent)-right s/p repair with mesh 05/21/2014  . Schatzki's ring   . Sinus bradycardia    a. prompting dc of Diltiazem (?chronotropic incompetence) in 2017.  [name] Sleep apnea     "mild" no cpap  . Thoracic aortic aneurysm (TAA) (HCC)     PSH:  Past Surgical History:  Procedure Laterality Date  . COLONOSCOPY WITH PROPOFOL N/A 05/11/2016   Procedure: COLONOSCOPY WITH PROPOFOL;  Surgeon: Garlan Fair, MD;  Location: [facility];  Service: Endoscopy;  Laterality: N/A;  . EYE SURGERY     CATARACT R  EYE  . HERNIA REPAIR     UMBILICAL  . INGUINAL HERNIA REPAIR Right 06/18/2014   Procedure: RIGHT INGUINAL HERNIA REPAIR;  Surgeon: Odis Hollingshead, MD;  Location: [facility];  Service: General;  Laterality: Right;  . INSERTION OF MESH Right 06/18/2014   Procedure: INSERTION OF MESH;  Surgeon: Odis Hollingshead, MD;  Location: [facility];  Service: General;  Laterality: Right;  . NECK SURGERY  2000   cervical disc surgery(limited ROM)- retained hardware  . TONSILLECTOMY      Social History:  Social History   Socioeconomic History  . Marital status: Married    Spouse name: Not on file  . Number of children: 4  . Years of education: Not on file  . Highest education level: Not on file  Occupational History  . Occupation: retired  Tobacco Use  . Smoking status: Never Smoker  . Smokeless tobacco: Never Used  Substance and Sexual Activity  . Alcohol use: Yes    Comment: none since 80's  . Drug use: No  . Sexual activity: Not Currently  Other Topics Concern  . Not on file  Social History Narrative  . Not on file   Social Determinants of Health   Financial Resource Strain:   . Difficulty of Paying Living Expenses:   Food Insecurity:   . Worried About Charity fundraiser in the Last Year:   . Arboriculturist in the Last Year:   Transportation Needs:   . Film/video editor (Medical):   [name] Lack of Transportation (Non-Medical):   Physical Activity:   . Days of Exercise per Week:   . Minutes of Exercise per Session:   Stress:   . Feeling of Stress :   Social Connections:   . Frequency of Communication with Friends and Family:   . Frequency of Social  Gatherings with Friends and Family:   . Attends Religious Services:   . Active Member of Clubs or Organizations:   . Attends Archivist Meetings:   [name] Marital Status:   Intimate Partner Violence:   . Fear of Current or Ex-Partner:   . Emotionally Abused:   [name] Physically Abused:   . Sexually Abused:     Family History:  Family History  Problem Relation Age of Onset  . Heart disease Mother   . Stroke Father   . Cancer Brother        colon  . Diabetes Sister   . Leukemia Maternal Grandmother     Medications:   Current Outpatient Medications on File Prior to Visit  Medication Sig Dispense Refill  . amLODipine (NORVASC) 10  MG tablet TAKE 1 TABLET DAILY 90 tablet 3  . clopidogrel (PLAVIX) 75 MG tablet Take 1 tablet (75 mg total) by mouth daily. 90 tablet 3  . doxazosin (CARDURA) 4 MG tablet Take 4 mg by mouth at bedtime.     . fluticasone (FLONASE) 50 MCG/ACT nasal spray Place 2 sprays into both nostrils daily as needed for allergies or rhinitis.     [name] irbesartan (AVAPRO) 150 MG tablet TAKE 1 TABLET DAILY (DOSE DECREASE) 90 tablet 3  . loratadine (CLARITIN) 10 MG tablet Take 10 mg by mouth daily.    . potassium chloride (KLOR-CON) 10 MEQ tablet Take 30 mEq by mouth 2 (two) times daily.    [name] spironolactone (ALDACTONE) 25 MG tablet TAKE ONE-HALF (1/2) TABLET DAILY 45 tablet 3  . timolol (TIMOPTIC) 0.5 % ophthalmic solution Place 1 drop into both eyes daily.   4   No current facility-administered medications on file prior to visit.    Allergies:   Allergies  Allergen Reactions  . Aspirin Hives, Itching and Nausea Only  . Codeine Hives, Itching and Nausea Only  . Shellfish Allergy Hives     Physical Exam  Vitals:   04/23/20 0917  BP: 124/70  Pulse: 67  Weight: 211 lb (95.7 kg)  Height: 5\' 8"  (1.727 m)   Body mass index is 32.08 kg/m. No exam data present   General: well developed, well nourished, pleasant elderly male, seated, in no evident distress Head:  head normocephalic and atraumatic.   Neck: supple with no carotid or supraclavicular bruits Cardiovascular: regular rate and rhythm, no murmurs Musculoskeletal: no deformity Skin:  no rash/petichiae Vascular:  Normal pulses all extremities   Neurologic Exam Mental Status: Awake and fully alert.   Fluent speech and language.  Oriented to place and time. Recent and remote memory intact. Attention span, concentration and fund of knowledge appropriate. Mood and affect appropriate.  Cranial Nerves: Pupils equal, briskly reactive to light. Extraocular movements full without nystagmus. Visual fields full to confrontation. Hearing intact. Facial sensation intact. Face, tongue, palate moves normally and symmetrically.  Motor: Normal bulk and tone. Normal strength in all tested extremity muscles. Sensory.: intact to touch , pinprick , position and vibratory sensation.  Coordination: Rapid alternating movements normal in all extremities. Finger-to-nose and heel-to-shin performed accurately bilaterally. Gait and Station: Arises from chair without difficulty. Stance is normal. Gait demonstrates normal stride length and balance Reflexes: 1+ and symmetric. Toes downgoing.        ASSESSMENT: Bob Elliott is a 79 y.o. year old male presented with transient right eye vision loss on 09/19/2019 likely secondary to right amaurosis fugax. Vascular risk factors include HTN, HLD, OSA on CPAP and aortic aneurysm.  He continues to experience daily right frontal/periorbital headache which has been present since hospitalization as well as ongoing daily fatigue.  Denies reoccurring visual loss or new stroke/TIA symptoms    PLAN:  1. Right amaurosis fugax: Continue clopidogrel 75 mg daily  and Lipitor for stroke prevention.  Discussion again regarding importance of ongoing use of both Plavix and atorvastatin as transient monocular visual loss likely ischemic etiology and at risk for cardiovascular disease and stroke.   Maintain strict control of hypertension with blood pressure goal below 130/90, diabetes with hemoglobin A1c goal below 6.5% and cholesterol with LDL cholesterol (bad cholesterol) goal below 70 mg/dL.  I also advised the patient to eat a healthy diet with plenty of whole grains, cereals, fruits and vegetables, exercise regularly with at least  30 minutes of continuous activity daily and maintain ideal body weight. 2. Right frontal headache and fatigue: Present since hospitalization.  Reports headache daily occurrence mild in nature and resolves on own after short duration.  He believes this is due to atorvastatin as both symptoms presented after initiation of atorvastatin.  Discussion regarding possible switch of statin but he requests trial of taking atorvastatin 3 times weekly prior to switching.  Also discussed underlying history of OSA and benefit of repeating sleep study as untreated sleep apnea could also be contributing to headaches and fatigue but he declines interest in sleep evaluation at this time.  He plans on speaking further with PCP and cardiology regarding recommendations 3. HTN: Stable.  Continue to follow with PCP for monitoring and management 4. HLD: Recent LDL 34 with goal <70.  Continue atorvastatin 10 mg 3 times daily and continue to follow with PCP/cardiology for ongoing prescribing, monitoring and management 5. OSA: Documented history with patient reporting undergoing sleep study "over 15 years ago".  Reported mild in nature not on CPAP at home.  Encouraged repeat sleep study as untreated sleep apnea increases risk of cardiovascular and stroke as well as possibly causing headache and fatigue.  He declines interest in referral at this time and verbalized understanding of risk of untreated sleep apnea.  He will call office if interested in pursuing evaluation and the future    May follow-up as needed as he has been stable and routinely followed by cardiology and PCP   I spent 35  minutes of face-to-face and non-face-to-face time with patient.  This included previsit chart review, lab review, study review, order entry, electronic health record documentation, patient education    Frann Rider, [facility]  [facility] [address] [place], [place] [address]  Phone [phone] Fax [phone] Note: This document was prepared with digital dictation and possible smart phrase technology. Any transcriptional errors that result from this process are unintentional.

## 2020-04-23 NOTE — Patient Instructions (Addendum)
Continue clopidogrel 75 mg daily  and atorvastatin 10 mg three times weekly for secondary stroke prevention  If you continue to experience possible side effects, you could possibly trial a different type of cholesterol medication such as Crestor but you may also benefit from evaluation of sleep apnea which can cause increased fatigue and headaches. If you would like to proceed with a sleep study, please call office  Continue to follow up with PCP regarding cholesterol and blood pressure management   Maintain strict control of hypertension with blood pressure goal below 130/90, diabetes with hemoglobin A1c goal below 6.5% and cholesterol with LDL cholesterol (bad cholesterol) goal below 70 mg/dL. I also advised the patient to eat a healthy diet with plenty of whole grains, cereals, fruits and vegetables, exercise regularly and maintain ideal body weight.  As you have been doing well from a neurological standpoint, please follow up as needed       Thank you for coming to see [place] at [facility]. I hope we have been able to provide you high quality care today.  You may receive a patient satisfaction survey over the next few weeks. We would appreciate your feedback and comments so that we may continue to improve ourselves and the health of our patients.    Sleep Apnea Sleep apnea affects breathing during sleep. It causes breathing to stop for a short time or to become shallow. It can also increase the risk of:  Heart attack.  Stroke.  Being very overweight (obese).  Diabetes.  Heart failure.  Irregular heartbeat. The goal of treatment is to help you breathe normally again. What are the causes? There are three kinds of sleep apnea:  Obstructive sleep apnea. This is caused by a blocked or collapsed airway.  Central sleep apnea. This happens when the brain does not send the right signals to the muscles that control breathing.  Mixed sleep apnea. This is a  combination of obstructive and central sleep apnea. The most common cause of this condition is a collapsed or blocked airway. This can happen if:  Your throat muscles are too relaxed.  Your tongue and tonsils are too large.  You are overweight.  Your airway is too small. What increases the risk?  Being overweight.  Smoking.  Having a small airway.  Being older.  Being male.  Drinking alcohol.  Taking medicines to calm yourself (sedatives or tranquilizers).  Having family members with the condition. What are the signs or symptoms?  Trouble staying asleep.  Being sleepy or tired during the day.  Getting angry a lot.  Loud snoring.  Headaches in the morning.  Not being able to focus your mind (concentrate).  Forgetting things.  Less interest in sex.  Mood swings.  Personality changes.  Feelings of sadness (depression).  Waking up a lot during the night to pee (urinate).  Dry mouth.  Sore throat. How is this diagnosed?  Your medical history.  A physical exam.  A test that is done when you are sleeping (sleep study). The test is most often done in a sleep lab but may also be done at home. How is this treated?   Sleeping on your side.  Using a medicine to get rid of mucus in your nose (decongestant).  Avoiding the use of alcohol, medicines to help you relax, or certain pain medicines (narcotics).  Losing weight, if needed.  Changing your diet.  Not smoking.  Using a machine to open your airway while you sleep, such  as: ? An oral appliance. This is a mouthpiece that shifts your lower jaw forward. ? A CPAP device. This device blows air through a mask when you breathe out (exhale). ? An EPAP device. This has valves that you put in each nostril. ? A BPAP device. This device blows air through a mask when you breathe in (inhale) and breathe out.  Having surgery if other treatments do not work. It is important to get treatment for sleep apnea.  Without treatment, it can lead to:  High blood pressure.  Coronary artery disease.  In men, not being able to have an erection (impotence).  Reduced thinking ability. Follow these instructions at home: Lifestyle  Make changes that your doctor recommends.  Eat a healthy diet.  Lose weight if needed.  Avoid alcohol, medicines to help you relax, and some pain medicines.  Do not use any products that contain nicotine or tobacco, such as cigarettes, e-cigarettes, and chewing tobacco. If you need help quitting, ask your doctor. General instructions  Take over-the-counter and prescription medicines only as told by your doctor.  If you were given a machine to use while you sleep, use it only as told by your doctor.  If you are having surgery, make sure to tell your doctor you have sleep apnea. You may need to bring your device with you.  Keep all follow-up visits as told by your doctor. This is important. Contact a doctor if:  The machine that you were given to use during sleep bothers you or does not seem to be working.  You do not get better.  You get worse. Get help right away if:  Your chest hurts.  You have trouble breathing in enough air.  You have an uncomfortable feeling in your back, arms, or stomach.  You have trouble talking.  One side of your body feels weak.  A part of your face is hanging down. These symptoms may be an emergency. Do not wait to see if the symptoms will go away. Get medical help right away. Call your local emergency services (911 in the [place] Do not drive yourself to the hospital. Summary  This condition affects breathing during sleep.  The most common cause is a collapsed or blocked airway.  The goal of treatment is to help you breathe normally while you sleep. This information is not intended to replace advice given to you by your health care provider. Make sure you discuss any questions you have with your health care  provider. Document Revised: 08/18/2018 Document Reviewed: 06/27/2018 Elsevier Patient Education  [employer].

## 2020-04-28 NOTE — Progress Notes (Signed)
I agree with the above plan 

## 2020-04-29 ENCOUNTER — Encounter: Payer: Self-pay | Admitting: Physician Assistant

## 2020-04-29 ENCOUNTER — Ambulatory Visit (INDEPENDENT_AMBULATORY_CARE_PROVIDER_SITE_OTHER): Payer: Medicare Other | Admitting: Physician Assistant

## 2020-04-29 ENCOUNTER — Other Ambulatory Visit: Payer: Self-pay

## 2020-04-29 VITALS — BP 120/54 | HR 75 | Ht 68.0 in | Wt 211.0 lb

## 2020-04-29 DIAGNOSIS — I712 Thoracic aortic aneurysm, without rupture, unspecified: Secondary | ICD-10-CM

## 2020-04-29 DIAGNOSIS — N182 Chronic kidney disease, stage 2 (mild): Secondary | ICD-10-CM | POA: Diagnosis not present

## 2020-04-29 DIAGNOSIS — E785 Hyperlipidemia, unspecified: Secondary | ICD-10-CM

## 2020-04-29 DIAGNOSIS — R001 Bradycardia, unspecified: Secondary | ICD-10-CM | POA: Diagnosis not present

## 2020-04-29 DIAGNOSIS — G453 Amaurosis fugax: Secondary | ICD-10-CM | POA: Diagnosis not present

## 2020-04-29 DIAGNOSIS — I10 Essential (primary) hypertension: Secondary | ICD-10-CM | POA: Diagnosis not present

## 2020-04-29 DIAGNOSIS — R0600 Dyspnea, unspecified: Secondary | ICD-10-CM | POA: Diagnosis not present

## 2020-04-29 DIAGNOSIS — R0609 Other forms of dyspnea: Secondary | ICD-10-CM

## 2020-04-29 LAB — BASIC METABOLIC PANEL
BUN/Creatinine Ratio: 13 (ref 10–24)
BUN: 17 mg/dL (ref 8–27)
CO2: 24 mmol/L (ref 20–29)
Calcium: 9.3 mg/dL (ref 8.6–10.2)
Chloride: 104 mmol/L (ref 96–106)
Creatinine, Ser: 1.28 mg/dL — ABNORMAL HIGH (ref 0.76–1.27)
GFR calc Af Amer: 61 mL/min/{1.73_m2} (ref >59)
GFR calc non Af Amer: 53 mL/min/{1.73_m2} — ABNORMAL LOW (ref >59)
Glucose: 103 mg/dL — ABNORMAL HIGH (ref 65–99)
Potassium: 4.6 mmol/L (ref 3.5–5.2)
Sodium: 139 mmol/L (ref 134–144)

## 2020-04-29 NOTE — Patient Instructions (Signed)
Medication Instructions:  Your physician recommends that you continue on your current medications as directed. Please refer to the Current Medication list given to you today.  *If you need a refill on your cardiac medications before your next appointment, please call your pharmacy*   Lab Work: TODAY: BMET If you have labs (blood work) drawn today and your tests are completely normal, you will receive your results only by: [name] MyChart Message (if you have MyChart) OR . A paper copy in the mail If you have any lab test that is abnormal or we need to change your treatment, we will call you to review the results.   Testing/Procedures: Non-Cardiac CT scanning, (CAT scanning), is a noninvasive, special x-ray that produces cross-sectional images of the body using x-rays and a computer. CT scans help physicians diagnose and treat medical conditions. For some CT exams, a contrast material is used to enhance visibility in the area of the body being studied. CT scans provide greater clarity and reveal more details than regular x-ray exams.  Follow-Up: At [facility], you and your health needs are our priority.  As part of our continuing mission to provide you with exceptional heart care, we have created designated Provider Care Teams.  These Care Teams include your primary Cardiologist (physician) and Advanced Practice Providers (APPs -  Physician Assistants and Nurse Practitioners) who all work together to provide you with the care you need, when you need it.  We recommend signing up for the patient portal called "MyChart".  Sign up information is provided on this After Visit Summary.  MyChart is used to connect with patients for Virtual Visits (Telemedicine).  Patients are able to view lab/test results, encounter notes, upcoming appointments, etc.  Non-urgent messages can be sent to your provider as well.   To learn more about what you can do with MyChart, go to [email or web address].    Your next  appointment:   6 month(s)  The format for your next appointment:   In Person  Provider:   You may see [name], MD or one of the following Advanced Practice Providers on your designated Care Team:    Melina Copa, PA-C  Ermalinda Barrios, PA-C

## 2020-04-30 ENCOUNTER — Other Ambulatory Visit: Payer: Self-pay

## 2020-04-30 DIAGNOSIS — R7989 Other specified abnormal findings of blood chemistry: Secondary | ICD-10-CM

## 2020-05-09 ENCOUNTER — Ambulatory Visit (HOSPITAL_COMMUNITY)
Admission: RE | Admit: 2020-05-09 | Discharge: 2020-05-09 | Disposition: A | Discharge status: Home / Self Care | Payer: Medicare Other | Source: Ambulatory Visit | Attending: Physician Assistant | Admitting: Physician Assistant

## 2020-05-09 ENCOUNTER — Other Ambulatory Visit: Payer: Self-pay

## 2020-05-09 ENCOUNTER — Encounter (HOSPITAL_COMMUNITY): Payer: Self-pay

## 2020-05-09 DIAGNOSIS — I712 Thoracic aortic aneurysm, without rupture, unspecified: Secondary | ICD-10-CM

## 2020-05-09 MED ORDER — IOHEXOL 350 MG/ML SOLN
100.0000 mL | Freq: Once | INTRAVENOUS | Status: AC | PRN
  Administered 2020-05-09: 100 mL via INTRAVENOUS

## 2020-05-16 ENCOUNTER — Other Ambulatory Visit: Payer: Medicare Other

## 2020-05-21 ENCOUNTER — Ambulatory Visit (HOSPITAL_COMMUNITY)
Admission: RE | Admit: 2020-05-21 | Discharge: 2020-05-21 | Disposition: A | Discharge status: Home / Self Care | Payer: Medicare Other | Source: Ambulatory Visit | Attending: Physician Assistant | Admitting: Physician Assistant

## 2020-05-21 ENCOUNTER — Other Ambulatory Visit: Payer: Self-pay

## 2020-05-21 ENCOUNTER — Other Ambulatory Visit: Payer: Self-pay | Admitting: Physician Assistant

## 2020-05-21 DIAGNOSIS — I712 Thoracic aortic aneurysm, without rupture, unspecified: Secondary | ICD-10-CM

## 2020-05-22 DIAGNOSIS — R350 Frequency of micturition: Secondary | ICD-10-CM | POA: Diagnosis not present

## 2020-05-22 DIAGNOSIS — R3915 Urgency of urination: Secondary | ICD-10-CM | POA: Diagnosis not present

## 2020-05-22 DIAGNOSIS — N400 Enlarged prostate without lower urinary tract symptoms: Secondary | ICD-10-CM | POA: Diagnosis not present

## 2020-05-23 ENCOUNTER — Other Ambulatory Visit: Payer: Self-pay

## 2020-05-23 DIAGNOSIS — I712 Thoracic aortic aneurysm, without rupture, unspecified: Secondary | ICD-10-CM

## 2020-05-26 ENCOUNTER — Other Ambulatory Visit: Payer: Self-pay

## 2020-05-26 DIAGNOSIS — I712 Thoracic aortic aneurysm, without rupture, unspecified: Secondary | ICD-10-CM

## 2020-06-23 ENCOUNTER — Institutional Professional Consult (permissible substitution) (INDEPENDENT_AMBULATORY_CARE_PROVIDER_SITE_OTHER): Payer: Medicare Other | Admitting: Cardiothoracic Surgery

## 2020-06-23 ENCOUNTER — Other Ambulatory Visit: Payer: Self-pay

## 2020-06-23 VITALS — BP 135/76 | HR 71 | Temp 98.1°F | Resp 20 | Ht 68.0 in | Wt 210.0 lb

## 2020-06-23 DIAGNOSIS — I712 Thoracic aortic aneurysm, without rupture: Secondary | ICD-10-CM | POA: Diagnosis not present

## 2020-06-23 DIAGNOSIS — I7121 Aneurysm of the ascending aorta, without rupture: Secondary | ICD-10-CM

## 2020-06-26 NOTE — Progress Notes (Signed)
[address]       [place] [address]             [phone]     CARDIOTHORACIC SURGERY office visitation  Referring Provider is Imogene Burn, PA-C Primary Cardiologist is [name], MD PCP is [name], MD  Chief Complaint  Patient presents with  . Thoracic Aortic Aneurysm    Surgical consult    HPI:  79 year old male presents for evaluation of ascending aortic aneurysm. This was initially picked up as an incidental finding almost 3 years ago now when he showed up with chest pain and underwent CT scan to rule out PE. This has been followed up serially by CT scans and his most recent one demonstrates incremental increase in size to approximately 52 mm in maximal diameter. He endorses frequent shortness of breath and fatigue. His echocardiogram demonstrates mild to moderate aortic insufficiency. He denies any severe chest pain or personal history of aneurysm formation. However there is an indistinct family history of aneurysms scattered through his lineage. Past Medical History:  Diagnosis Date  . Arthritis   . BPH (benign prostatic hyperplasia)   . CKD (chronic kidney disease), stage II    a. CKD II-III by labs.  . Dizziness 10/21/2016  . Esophageal dysmotility   . Essential hypertension    Trial off acei 08/12/2017 for unexplained sob/ noct smothering   . GERD (gastroesophageal reflux disease)   . GI bleed   . Glaucoma    BOTH EYES  . Hyperlipidemia   . Hypertension   . Inguinal hernia without mention of obstruction or gangrene, unilateral or unspecified, (not specified as recurrent)-right s/p repair with mesh 05/21/2014  . Schatzki's ring   . Sinus bradycardia    a. prompting dc of Diltiazem (?chronotropic incompetence) in 2017.  [name] Sleep apnea    "mild" no cpap  . Thoracic aortic aneurysm (TAA) ([place])     Past Surgical History:  Procedure Laterality Date  . COLONOSCOPY WITH PROPOFOL N/A 05/11/2016   Procedure: COLONOSCOPY WITH  PROPOFOL;  Surgeon: Garlan Fair, MD;  [facility];  Service: Endoscopy;  Laterality: N/A;  . EYE SURGERY     CATARACT R  EYE  . HERNIA REPAIR     UMBILICAL  . INGUINAL HERNIA REPAIR Right 06/18/2014   Procedure: RIGHT INGUINAL HERNIA REPAIR;  Surgeon: Odis Hollingshead, MD;  Location: [facility];  Service: General;  Laterality: Right;  . INSERTION OF MESH Right 06/18/2014   Procedure: INSERTION OF MESH;  Surgeon: Odis Hollingshead, MD;  [facility];  Service: General;  Laterality: Right;  . NECK SURGERY  2000   cervical disc surgery(limited ROM)- retained hardware  . TONSILLECTOMY      Family History  Problem Relation Age of Onset  . Heart disease Mother   . Stroke Father   . Cancer Brother        colon  . Diabetes Sister   . Leukemia Maternal Grandmother     Social History   Socioeconomic History  . Marital status: Married    Spouse name: Not on file  . Number of children: 4  . Years of education: Not on file  . Highest education level: Not on file  Occupational History  . Occupation: retired  Tobacco Use  . Smoking status: Never Smoker  . Smokeless tobacco: Never Used  Vaping Use  . Vaping Use: Never used  Substance and Sexual Activity  . Alcohol  use: Yes    Comment: none since 80's  . Drug use: No  . Sexual activity: Not Currently  Other Topics Concern  . Not on file  Social History Narrative  . Not on file   Social Determinants of Health   Financial Resource Strain:   . Difficulty of Paying Living Expenses:   Food Insecurity:   . Worried About Charity fundraiser in the Last Year:   . Arboriculturist in the Last Year:   Transportation Needs:   . Film/video editor (Medical):   [name] Lack of Transportation (Non-Medical):   Physical Activity:   . Days of Exercise per Week:   . Minutes of Exercise per Session:   Stress:   . Feeling of Stress :   Social Connections:   . Frequency of Communication with Friends and Family:   . Frequency of  Social Gatherings with Friends and Family:   . Attends Religious Services:   . Active Member of Clubs or Organizations:   . Attends Archivist Meetings:   [name] Marital Status:   Intimate Partner Violence:   . Fear of Current or Ex-Partner:   . Emotionally Abused:   [name] Physically Abused:   . Sexually Abused:     Current Outpatient Medications  Medication Sig Dispense Refill  . amLODipine (NORVASC) 10 MG tablet TAKE 1 TABLET DAILY 90 tablet 3  . atorvastatin (LIPITOR) 10 MG tablet Take 1 tablet (10 mg total) by mouth 3 (three) times a week. 39 tablet 3  . clopidogrel (PLAVIX) 75 MG tablet Take 1 tablet (75 mg total) by mouth daily. 90 tablet 3  . doxazosin (CARDURA) 4 MG tablet Take 4 mg by mouth at bedtime.     . fluticasone (FLONASE) 50 MCG/ACT nasal spray Place 2 sprays into both nostrils daily as needed for allergies or rhinitis.     [name] irbesartan (AVAPRO) 150 MG tablet TAKE 1 TABLET DAILY (DOSE DECREASE) 90 tablet 3  . loratadine (CLARITIN) 10 MG tablet Take 10 mg by mouth daily.    . potassium chloride (KLOR-CON) 10 MEQ tablet Take 30 mEq by mouth 2 (two) times daily.    . solifenacin (VESICARE) 5 MG tablet Take 5 mg by mouth daily.    [name] spironolactone (ALDACTONE) 25 MG tablet TAKE ONE-HALF (1/2) TABLET DAILY 45 tablet 3  . timolol (TIMOPTIC) 0.5 % ophthalmic solution Place 1 drop into both eyes daily.   4   No current facility-administered medications for this visit.    Allergies  Allergen Reactions  . Aspirin Hives, Itching and Nausea Only  . Codeine Hives, Itching and Nausea Only  . Shellfish Allergy Hives      Review of Systems:   General:  Good appetite, no weight change  Cardiac:  Denies chest pain with exertion/ at rest, occasional SOB/fatigue   Respiratory:  No symptoms  GI:   Endorses difficulty swallowing,   GU:   Positive increased frequency,  Vascular:  Negative  Neuro:   History of TIA's and right sided headaches,   Musculoskeletal: Long history of  arthritis,   Skin:   Negative  Psych:   Negative  Eyes:   Positive floaters, does wear glasses  ENT:   Positive dentures,   Hematologic:  Negative  Endocrine:  No diabetes,      Physical Exam:   BP 135/76   Pulse 71   Temp 98.1 F (36.7 C) (Skin)   Resp 20   Ht 5\' 8"  (  1.727 m)   Wt 95.3 kg   SpO2 98%   BMI 31.93 kg/m   General:   well-appearing  HEENT:  Unremarkable   Neck:   no JVD, no bruits, no adenopathy   Chest:   clear to auscultation, symmetrical breath sounds, no wheezes, no rhonchi   CV:   RRR, no  murmur   Abdomen:  soft, non-tender, no masses   Extremities:  warm, well-perfused, pulses intact, no LE edema  Rectal/GU  Deferred  Neuro:   Grossly non-focal and symmetrical throughout  Skin:   Clean and dry, no rashes, no breakdown   Diagnostic Tests:  I have personally reviewed his available imaging studies which include CT scan of the chest from May 21, 2020 and August 02, 2018 in addition to an echocardiogram performed in November 2020. I agree with the basic interpretation of these images   Impression:  Pleasant 79 year old gentleman with incidentally discovered ascending aortic aneurysm. This appears to have grown incrementally since the first scan 2 years ago. However it still remains smaller than what is typically used as a cutoff for elective surgery   Plan:  Follow-up in 6 months with CT coronary. Report any severe chest or back pain to local emergency department   I spent in excess of 30 minutes during the conduct of this office consultation and >50% of this time involved direct face-to-face encounter with the patient for counseling and/or coordination of their care.          Level 3 Office Consult = 40 minutes         Level 4 Office Consult = 60 minutes         Level 5 Office Consult = 80 minutes  B. Murvin Natal, MD 06/26/2020 1:01 PM

## 2020-07-24 DIAGNOSIS — M549 Dorsalgia, unspecified: Secondary | ICD-10-CM | POA: Diagnosis not present

## 2020-07-30 DIAGNOSIS — M7061 Trochanteric bursitis, right hip: Secondary | ICD-10-CM | POA: Diagnosis not present

## 2020-07-30 DIAGNOSIS — M25551 Pain in right hip: Secondary | ICD-10-CM | POA: Diagnosis not present

## 2020-07-30 DIAGNOSIS — M545 Low back pain: Secondary | ICD-10-CM | POA: Diagnosis not present

## 2020-08-01 DIAGNOSIS — S335XXD Sprain of ligaments of lumbar spine, subsequent encounter: Secondary | ICD-10-CM | POA: Diagnosis not present

## 2020-08-06 DIAGNOSIS — S335XXD Sprain of ligaments of lumbar spine, subsequent encounter: Secondary | ICD-10-CM | POA: Diagnosis not present

## 2020-08-08 DIAGNOSIS — S335XXD Sprain of ligaments of lumbar spine, subsequent encounter: Secondary | ICD-10-CM | POA: Diagnosis not present

## 2020-08-11 DIAGNOSIS — S335XXD Sprain of ligaments of lumbar spine, subsequent encounter: Secondary | ICD-10-CM | POA: Diagnosis not present

## 2020-08-13 DIAGNOSIS — S335XXD Sprain of ligaments of lumbar spine, subsequent encounter: Secondary | ICD-10-CM | POA: Diagnosis not present

## 2020-08-14 ENCOUNTER — Other Ambulatory Visit: Payer: Self-pay | Admitting: Cardiology

## 2020-08-14 DIAGNOSIS — E785 Hyperlipidemia, unspecified: Secondary | ICD-10-CM

## 2020-08-14 NOTE — Telephone Encounter (Signed)
Pt's pharmacy is requesting a refill on atorvastatin. Would Dr. Meda Coffee like to refill this medication? Please address

## 2020-08-25 DIAGNOSIS — Z23 Encounter for immunization: Secondary | ICD-10-CM | POA: Diagnosis not present

## 2020-08-29 DIAGNOSIS — M545 Low back pain, unspecified: Secondary | ICD-10-CM | POA: Diagnosis not present

## 2020-09-04 DIAGNOSIS — Z23 Encounter for immunization: Secondary | ICD-10-CM | POA: Diagnosis not present

## 2020-09-18 ENCOUNTER — Other Ambulatory Visit: Payer: Self-pay | Admitting: Orthopaedic Surgery

## 2020-09-18 DIAGNOSIS — M545 Low back pain, unspecified: Secondary | ICD-10-CM

## 2020-09-26 ENCOUNTER — Ambulatory Visit
Admission: RE | Admit: 2020-09-26 | Discharge: 2020-09-26 | Disposition: A | Discharge status: Home / Self Care | Payer: Medicare Other | Source: Ambulatory Visit | Attending: Orthopaedic Surgery | Admitting: Orthopaedic Surgery

## 2020-09-26 DIAGNOSIS — M48061 Spinal stenosis, lumbar region without neurogenic claudication: Secondary | ICD-10-CM | POA: Diagnosis not present

## 2020-09-26 DIAGNOSIS — M545 Low back pain, unspecified: Secondary | ICD-10-CM | POA: Diagnosis not present

## 2020-09-30 DIAGNOSIS — N183 Chronic kidney disease, stage 3 unspecified: Secondary | ICD-10-CM | POA: Diagnosis not present

## 2020-09-30 DIAGNOSIS — M109 Gout, unspecified: Secondary | ICD-10-CM | POA: Diagnosis not present

## 2020-09-30 DIAGNOSIS — E782 Mixed hyperlipidemia: Secondary | ICD-10-CM | POA: Diagnosis not present

## 2020-09-30 DIAGNOSIS — I712 Thoracic aortic aneurysm, without rupture: Secondary | ICD-10-CM | POA: Diagnosis not present

## 2020-09-30 DIAGNOSIS — I10 Essential (primary) hypertension: Secondary | ICD-10-CM | POA: Diagnosis not present

## 2020-09-30 DIAGNOSIS — I719 Aortic aneurysm of unspecified site, without rupture: Secondary | ICD-10-CM | POA: Diagnosis not present

## 2020-09-30 DIAGNOSIS — M47812 Spondylosis without myelopathy or radiculopathy, cervical region: Secondary | ICD-10-CM | POA: Diagnosis not present

## 2020-10-10 ENCOUNTER — Other Ambulatory Visit: Payer: Self-pay | Admitting: Cardiology

## 2020-10-28 ENCOUNTER — Other Ambulatory Visit: Payer: Self-pay

## 2020-10-28 ENCOUNTER — Ambulatory Visit (INDEPENDENT_AMBULATORY_CARE_PROVIDER_SITE_OTHER): Payer: Medicare Other | Admitting: Cardiology

## 2020-10-28 ENCOUNTER — Encounter: Payer: Self-pay | Admitting: Cardiology

## 2020-10-28 VITALS — BP 128/72 | HR 79 | Ht 68.0 in | Wt 212.2 lb

## 2020-10-28 DIAGNOSIS — R0609 Other forms of dyspnea: Secondary | ICD-10-CM | POA: Diagnosis not present

## 2020-10-28 DIAGNOSIS — I712 Thoracic aortic aneurysm, without rupture, unspecified: Secondary | ICD-10-CM

## 2020-10-28 DIAGNOSIS — E785 Hyperlipidemia, unspecified: Secondary | ICD-10-CM

## 2020-10-28 DIAGNOSIS — I10 Essential (primary) hypertension: Secondary | ICD-10-CM

## 2020-10-28 LAB — BASIC METABOLIC PANEL
BUN/Creatinine Ratio: 14 (ref 10–24)
BUN: 19 mg/dL (ref 8–27)
CO2: 26 mmol/L (ref 20–29)
Calcium: 9.4 mg/dL (ref 8.6–10.2)
Chloride: 103 mmol/L (ref 96–106)
Creatinine, Ser: 1.36 mg/dL — ABNORMAL HIGH (ref 0.76–1.27)
GFR calc Af Amer: 56 mL/min/{1.73_m2} — ABNORMAL LOW (ref >59)
GFR calc non Af Amer: 49 mL/min/{1.73_m2} — ABNORMAL LOW (ref >59)
Glucose: 97 mg/dL (ref 65–99)
Potassium: 4.9 mmol/L (ref 3.5–5.2)
Sodium: 140 mmol/L (ref 134–144)

## 2020-10-28 NOTE — Progress Notes (Signed)
Cardiology Office Note    Date:  10/28/2020   ID:  Bob Elliott, DOB Sep 14, 1941, MRN [id number]  PCP:  [name], MD  Cardiologist: [name], MD EPS: None  Reason for visit: 6 months follow-up  History of Present Illness:  Bob Elliott is a 79 y.o. male with history of Thoracic aortic aneurysm 4.1 cm 03/2019, 4.6 cm in July 2021, HTN, HLD, sinus bradycardia, CKD II, amaurosis fugax on plavix-30 day monitor SB-NSR short runs NSVT 4 and 9 beats-no afib. Saw Dr. Meda Coffee 02/20/20 complaining of DOE and exercise intolerance, more tired on lipitor. Myoview normal LV function and no [email or web address] to ASA so started on Plavix b/c of recent TIA.  The patient is coming after 6 months, he has been doing well, he says he has to slow down his activities but does not have any significant dyspnea on exertion other than on strenuous activities and no chest pain.  He continues to enjoy life and does activities like fishing.  He denies any lower extremity edema orthopnea proximal nocturnal dyspnea.  He takes atorvastatin twice a week and is tolerating this dose well.  He is being followed by Dr. Orvan Seen has been following him for enlarging ascending aortic aneurysm, his last chest CTA in July 2021 showed increase in size from 41 to 46 mm.  Past Medical History:  Diagnosis Date  . Arthritis   . BPH (benign prostatic hyperplasia)   . CKD (chronic kidney disease), stage II    a. CKD II-III by labs.  . Dizziness 10/21/2016  . Esophageal dysmotility   . Essential hypertension    Trial off acei 08/12/2017 for unexplained sob/ noct smothering   . GERD (gastroesophageal reflux disease)   . GI bleed   . Glaucoma    BOTH EYES  . Hyperlipidemia   . Hypertension   . Inguinal hernia without mention of obstruction or gangrene, unilateral or unspecified, (not specified as recurrent)-right s/p repair with mesh 05/21/2014  . Schatzki's ring   . Sinus bradycardia    a. prompting dc of Diltiazem  (?chronotropic incompetence) in 2017.  [name] Sleep apnea    "mild" no cpap  . Thoracic aortic aneurysm (TAA) ([place])     Past Surgical History:  Procedure Laterality Date  . COLONOSCOPY WITH PROPOFOL N/A 05/11/2016   Procedure: COLONOSCOPY WITH PROPOFOL;  Surgeon: Garlan Fair, MD;  [facility];  Service: Endoscopy;  Laterality: N/A;  . EYE SURGERY     CATARACT R  EYE  . HERNIA REPAIR     UMBILICAL  . INGUINAL HERNIA REPAIR Right 06/18/2014   Procedure: RIGHT INGUINAL HERNIA REPAIR;  Surgeon: Odis Hollingshead, MD;  Location: [facility];  Service: General;  Laterality: Right;  . INSERTION OF MESH Right 06/18/2014   Procedure: INSERTION OF MESH;  Surgeon: Odis Hollingshead, MD;  Location: [facility];  Service: General;  Laterality: Right;  . NECK SURGERY  2000   cervical disc surgery(limited ROM)- retained hardware  . TONSILLECTOMY      Current Medications: Current Meds  Medication Sig  . amLODipine (NORVASC) 10 MG tablet TAKE 1 TABLET DAILY  . atorvastatin (LIPITOR) 10 MG tablet Take 10 mg by mouth 2 (two) times a week.  . clopidogrel (PLAVIX) 75 MG tablet Take 1 tablet (75 mg total) by mouth daily.  [name] doxazosin (CARDURA) 4 MG tablet Take 4 mg by mouth at bedtime.   . fluticasone (FLONASE) 50 MCG/ACT nasal spray Place 2 sprays into both nostrils  daily as needed for allergies or rhinitis.   [name] irbesartan (AVAPRO) 150 MG tablet TAKE 1 TABLET DAILY (DOSE DECREASE)  . loratadine (CLARITIN) 10 MG tablet Take 10 mg by mouth daily.  . potassium chloride (KLOR-CON) 10 MEQ tablet Take 3 tablets (30 mEq total) by mouth 2 (two) times daily.  . solifenacin (VESICARE) 5 MG tablet Take 5 mg by mouth daily.  [name] spironolactone (ALDACTONE) 25 MG tablet TAKE ONE-HALF (1/2) TABLET DAILY  . timolol (TIMOPTIC) 0.5 % ophthalmic solution Place 1 drop into both eyes daily.      Allergies:   Aspirin, Codeine, and Shellfish allergy   Social History   Socioeconomic History  . Marital status: Married     Spouse name: Not on file  . Number of children: 4  . Years of education: Not on file  . Highest education level: Not on file  Occupational History  . Occupation: retired  Tobacco Use  . Smoking status: Never Smoker  . Smokeless tobacco: Never Used  Vaping Use  . Vaping Use: Never used  Substance and Sexual Activity  . Alcohol use: Yes    Comment: none since 80's  . Drug use: No  . Sexual activity: Not Currently  Other Topics Concern  . Not on file  Social History Narrative  . Not on file   Social Determinants of Health   Financial Resource Strain: Not on file  Food Insecurity: Not on file  Transportation Needs: Not on file  Physical Activity: Not on file  Stress: Not on file  Social Connections: Not on file     Family History:  The patient's family history includes Cancer in his brother; Diabetes in his sister; Heart disease in his mother; Leukemia in his maternal grandmother; Stroke in his father.   ROS:   Please see the history of present illness.    ROS All other systems reviewed and are negative.   PHYSICAL EXAM:   VS:  BP 128/72   Pulse 79   Ht 5\' 8"  (1.727 m)   Wt 212 lb 3.2 oz (96.3 kg)   SpO2 95%   BMI 32.26 kg/m   Physical Exam  GEN: Well nourished, well developed, in no acute distress  Neck: no JVD, carotid bruits, or masses Cardiac:RRR; no murmurs, rubs, or gallops  Respiratory:  clear to auscultation bilaterally, normal work of breathing GI: soft, nontender, nondistended, + BS Ext: without cyanosis, clubbing, or edema, Good distal pulses bilaterally Neuro:  Alert and Oriented x 3 Psych: euthymic mood, full affect  Wt Readings from Last 3 Encounters:  10/28/20 212 lb 3.2 oz (96.3 kg)  06/23/20 210 lb (95.3 kg)  04/29/20 211 lb (95.7 kg)    Studies/Labs Reviewed:   EKG:  EKG is ordered today.   It shows sinus rhythm 70 bpm, normal EKG unchanged from prior.  This was personally reviewed.  Recent Labs: 10/30/2019: Hemoglobin 15.6; Platelets  182 02/20/2020: ALT 19 04/29/2020: BUN 17; Creatinine, Ser 1.28; Potassium 4.6; Sodium 139   Lipid Panel    Component Value Date/Time   CHOL 96 (L) 02/20/2020 0937   TRIG 75 02/20/2020 0937   HDL 46 02/20/2020 0937   CHOLHDL 2.1 02/20/2020 0937   CHOLHDL 4.0 09/20/2019 0740   VLDL 19 09/20/2019 0740   LDLCALC 34 02/20/2020 0937   Additional studies/ records that were reviewed today include:   NST 4/13/21Nuclear stress EF: 58%.  The left ventricular ejection fraction is normal (55-65%).  There was no ST segment deviation  noted during stress.  This is a low risk study.   Apical thinning not thought to be significant No ischemia EF 58%  Echocardiogram 09/20/2019 EF 60-65, mild LVH, Gr 1 DD, no RWMA, mild AI, Ao root 41 mm   Carotid US 09/20/2019 Summary: Right Carotid: Velocities in the right ICA are consistent with a 1-39% stenosis. Left Carotid: Velocities in the left ICA are consistent with a 1-39% stenosis. Vertebrals: Bilateral vertebral arteries demonstrate antegrade flow.   Chest CTA 03/26/2019 IMPRESSION: 1. No significant change in caliber of the thoracic aorta, measuring approximately 4.1 x 4.0 cm in the tubular ascending thoracic aorta, previously 4.3 x 4.2 cm when measured similarly. The aortic root is normal in caliber. The sinuses of Valsalva are enlarged up to 4.3 cm. The distal aortic arch and descending thoracic aorta are normal in caliber. Minimal atherosclerosis.  Chest CTA 05/2020 Interval increase in size of known thoracic aortic aneurysm now measuring up to 4.6 cm (previously measured 4.0 x 4.0 cm). Greater than 5 mm growth over the past 12 months is associated with an increased risk of aneurysm rupture. 2. Diffuse bilateral bronchial wall thickening, consistent with nonspecific infectious or inflammatory bronchitis.   Myoview 09/06/16  Nuclear stress EF: 53%.  Blood pressure demonstrated a normal response to exercise.  There was no ST segment  deviation noted during stress.  This is a low risk study.  The left ventricular ejection fraction is mildly decreased (45-54%).  Inferior and apical thinning consistent with soft tissue attenuaion (diaphragm), cannot exclude small subendocardial scar No ichemia   Echocardiogram 09/06/16 Mild septal hypertrophy with mod post wall hypertrophy, EF 55-60, no RWMA, Gr 1 DD, mild AI, trivial MR, mild RAE, GLS -17.2%    ASSESSMENT:    1. Essential hypertension   2. Hyperlipidemia, unspecified hyperlipidemia type   3. Thoracic aortic aneurysm without rupture ([place])   4. Chronic dyspnea      PLAN:  In order of problems listed above:  Dyspnea on exertion-myoview 02/26/20 apical thinning no ischemia EF 58%-doing well and able to walk all over [place] without trouble last week  Thoracic aneurysm 4.1 cm 03/2019 with significant increase to 4.6 cm in July 2021, he is being followed by Dr. Orvan Seen, will obtain another CTA in January 2022.  He will follow-up with Dr. Orvan Seen in February 2022.  Essential hypertension- pressure is well controlled  Hyperlipidemia -he is tolerating atorvastatin 10 mg twice weekly, his most recent cholesterol was HDL 46, LDL 34 and triglycerides 95.  Amaurosis fugax on plavix 30 day monitor without afib.No bleeding trouble.  Medication Adjustments/Labs and Tests Ordered: Current medicines are reviewed at length with the patient today.  Concerns regarding medicines are outlined above.  Medication changes, Labs and Tests ordered today are listed in the Patient Instructions below. Patient Instructions         Signed, [name], MD  10/28/2020 10:42 AM    [facility] [address], [place], [place]  [address] Phone: [phone]; Fax: [phone]

## 2020-10-28 NOTE — Patient Instructions (Addendum)
Medication Instructions:   Your physician recommends that you continue on your current medications as directed. Please refer to the Current Medication list given to you today.  *If you need a refill on your cardiac medications before your next appointment, please call your pharmacy*   Testing/Procedures:  CT ANGIO CHEST AORTA  W/WO CONTRAST TO BE DONE IN January 2022 PER DR. Meda Coffee   Follow-Up: At [facility], you and your health needs are our priority.  As part of our continuing mission to provide you with exceptional heart care, we have created designated Provider Care Teams.  These Care Teams include your primary Cardiologist (physician) and Advanced Practice Providers (APPs -  Physician Assistants and Nurse Practitioners) who all work together to provide you with the care you need, when you need it.  We recommend signing up for the patient portal called "MyChart".  Sign up information is provided on this After Visit Summary.  MyChart is used to connect with patients for Virtual Visits (Telemedicine).  Patients are able to view lab/test results, encounter notes, upcoming appointments, etc.  Non-urgent messages can be sent to your provider as well.   To learn more about what you can do with MyChart, go to [email or web address].    Your next appointment:   6 month(s)  The format for your next appointment:   In Person  Provider:   [name], MD

## 2020-10-28 NOTE — Addendum Note (Signed)
Addended by: Eulis Foster on: 10/28/2020 11:06 AM   Modules accepted: Orders

## 2020-10-31 DIAGNOSIS — M47816 Spondylosis without myelopathy or radiculopathy, lumbar region: Secondary | ICD-10-CM | POA: Diagnosis not present

## 2020-11-18 ENCOUNTER — Ambulatory Visit (HOSPITAL_COMMUNITY): Admission: RE | Admit: 2020-11-18 | Payer: Medicare Other | Source: Ambulatory Visit

## 2020-12-04 ENCOUNTER — Ambulatory Visit (HOSPITAL_COMMUNITY)
Admission: RE | Admit: 2020-12-04 | Discharge: 2020-12-04 | Disposition: A | Discharge status: Home / Self Care | Payer: Medicare Other | Source: Ambulatory Visit | Attending: Cardiology | Admitting: Cardiology

## 2020-12-04 ENCOUNTER — Other Ambulatory Visit: Payer: Self-pay

## 2020-12-04 ENCOUNTER — Encounter (HOSPITAL_COMMUNITY): Payer: Self-pay

## 2020-12-04 DIAGNOSIS — I318 Other specified diseases of pericardium: Secondary | ICD-10-CM | POA: Diagnosis not present

## 2020-12-04 DIAGNOSIS — I712 Thoracic aortic aneurysm, without rupture, unspecified: Secondary | ICD-10-CM

## 2020-12-04 DIAGNOSIS — J9859 Other diseases of mediastinum, not elsewhere classified: Secondary | ICD-10-CM | POA: Diagnosis not present

## 2020-12-04 DIAGNOSIS — I2510 Atherosclerotic heart disease of native coronary artery without angina pectoris: Secondary | ICD-10-CM | POA: Diagnosis not present

## 2020-12-04 LAB — POCT I-STAT CREATININE: Creatinine, Ser: 1.3 mg/dL — ABNORMAL HIGH (ref 0.61–1.24)

## 2020-12-04 MED ORDER — IOHEXOL 350 MG/ML SOLN
100.0000 mL | Freq: Once | INTRAVENOUS | Status: AC | PRN
  Administered 2020-12-04: 100 mL via INTRAVENOUS

## 2020-12-06 ENCOUNTER — Other Ambulatory Visit: Payer: Self-pay | Admitting: Cardiology

## 2020-12-17 DIAGNOSIS — H52203 Unspecified astigmatism, bilateral: Secondary | ICD-10-CM | POA: Diagnosis not present

## 2020-12-17 DIAGNOSIS — H2512 Age-related nuclear cataract, left eye: Secondary | ICD-10-CM | POA: Diagnosis not present

## 2020-12-17 DIAGNOSIS — H401131 Primary open-angle glaucoma, bilateral, mild stage: Secondary | ICD-10-CM | POA: Diagnosis not present

## 2020-12-22 ENCOUNTER — Ambulatory Visit (INDEPENDENT_AMBULATORY_CARE_PROVIDER_SITE_OTHER): Payer: Medicare Other | Admitting: Cardiothoracic Surgery

## 2020-12-22 ENCOUNTER — Encounter: Payer: Self-pay | Admitting: Cardiothoracic Surgery

## 2020-12-22 ENCOUNTER — Other Ambulatory Visit: Payer: Self-pay

## 2020-12-22 ENCOUNTER — Ambulatory Visit: Payer: Medicare Other | Admitting: Cardiothoracic Surgery

## 2020-12-22 VITALS — BP 134/76 | HR 86 | Resp 20 | Ht 68.0 in | Wt 211.0 lb

## 2020-12-22 DIAGNOSIS — I712 Thoracic aortic aneurysm, without rupture, unspecified: Secondary | ICD-10-CM

## 2020-12-29 NOTE — Progress Notes (Signed)
The patient returns for surveillance of ascending aortic aneurysm.  He has no complaints of chest pain or back pain in the interim.  His blood pressure is reasonably well controlled at home.   Current Outpatient Medications:  .  amLODipine (NORVASC) 10 MG tablet, TAKE 1 TABLET DAILY, Disp: 90 tablet, Rfl: 3 .  atorvastatin (LIPITOR) 10 MG tablet, Take 10 mg by mouth 2 (two) times a week., Disp: , Rfl:  .  clopidogrel (PLAVIX) 75 MG tablet, Take 1 tablet (75 mg total) by mouth daily., Disp: 90 tablet, Rfl: 3 .  doxazosin (CARDURA) 4 MG tablet, Take 4 mg by mouth at bedtime. , Disp: , Rfl:  .  fluticasone (FLONASE) 50 MCG/ACT nasal spray, Place 2 sprays into both nostrils daily as needed for allergies or rhinitis. , Disp: , Rfl:  .  irbesartan (AVAPRO) 150 MG tablet, TAKE 1 TABLET DAILY (DOSE DECREASE), Disp: 90 tablet, Rfl: 1 .  loratadine (CLARITIN) 10 MG tablet, Take 10 mg by mouth daily., Disp: , Rfl:  .  potassium chloride (KLOR-CON) 10 MEQ tablet, Take 3 tablets (30 mEq total) by mouth 2 (two) times daily., Disp: 540 tablet, Rfl: 1 .  solifenacin (VESICARE) 5 MG tablet, Take 5 mg by mouth daily., Disp: , Rfl:  .  spironolactone (ALDACTONE) 25 MG tablet, TAKE ONE-HALF (1/2) TABLET DAILY, Disp: 45 tablet, Rfl: 3 .  timolol (TIMOPTIC) 0.5 % ophthalmic solution, Place 1 drop into both eyes daily. , Disp: , Rfl: 4  Physical exam: Well-appearing no acute distress BP 134/76 (BP Location: Left Arm, Patient Position: Sitting)   Pulse 86   Resp 20   Ht 5\' 8"  (1.727 m)   Wt 95.7 kg   SpO2 92% Comment: RA with mask on  BMI 32.08 kg/m  Clear to auscultation bilaterally Regular rate and rhythm Extremities warm and well-perfused  Imaging:  I have personally reviewed his available imaging studies including chest CT from 2020-12-04.  This shows a stable ascending aortic aneurysm measuring less than 5 cm in maximal diameter   Impression/plan:  Stable ascending aortic aneurysm.  Reasonably  well-controlled blood pressure and no other ominous signs of danger to the aorta Plan follow-up in 1 year  Bob Elliott, [place]

## 2020-12-30 IMAGING — CT CT L SPINE W/O CM
3 of 4 series · 13 of 33 positions shown, 16 images · IV contrast (OMNIPAQUE 300)
Comparison: None available

CLINICAL DATA: Lower back pain

EXAM:
CT lumbar spine with contrast
TECHNIQUE: Multiplanar CT images of the lumbar spine were reconstructed from
contemporary CT of the abdomen.
CONTRAST:  None additional

[Series 3: coronal l-spine · coronal · 0.29mm/px · 3 of 84 slices shown]
[im 17/84  bone]
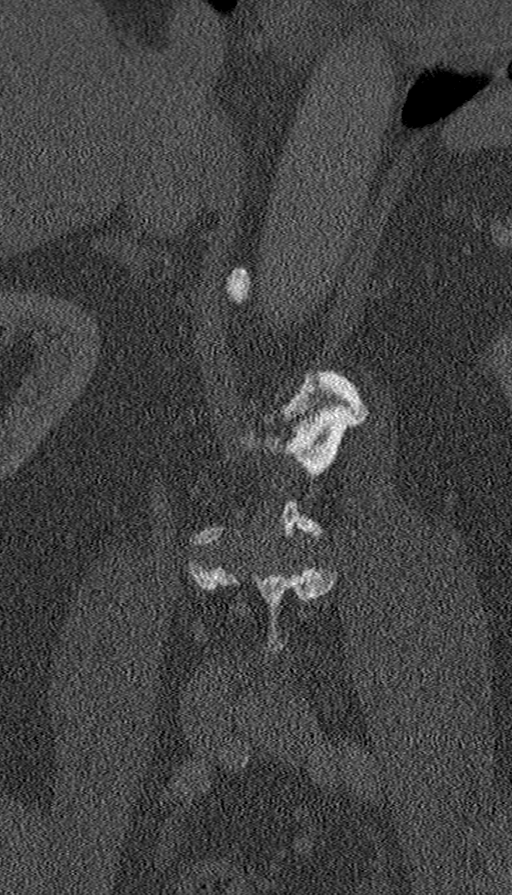
[im 34/84  bone]
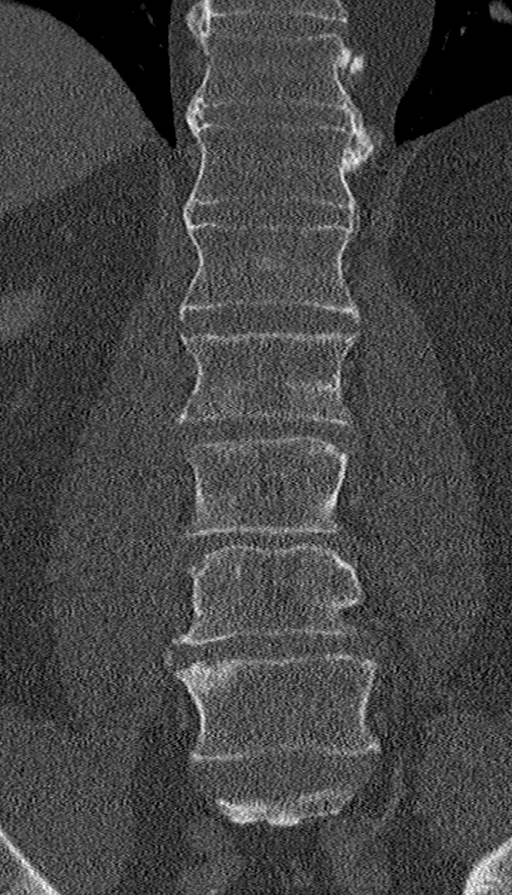
[im 50/84  bone]
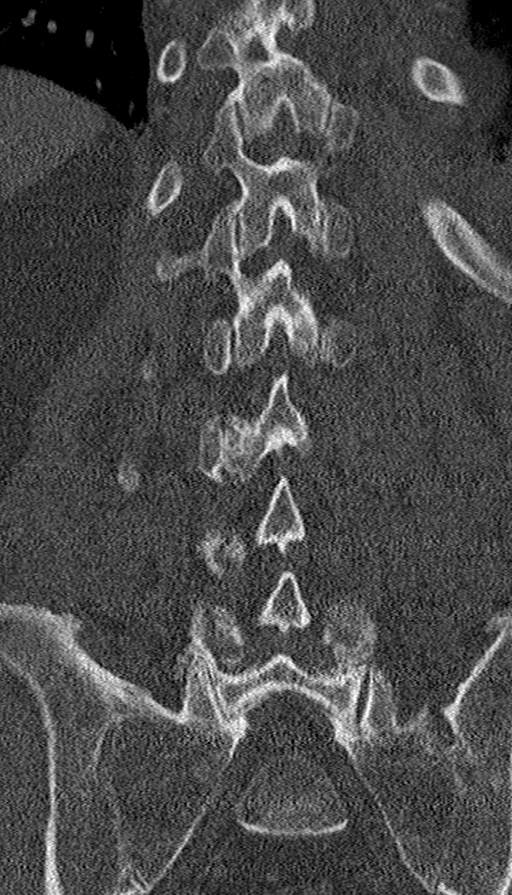

[Series 4: sagittal l-spine · sagittal · 0.70mm/px · 5 of 193 slices shown, 6 images]
[im 65/193  bone]
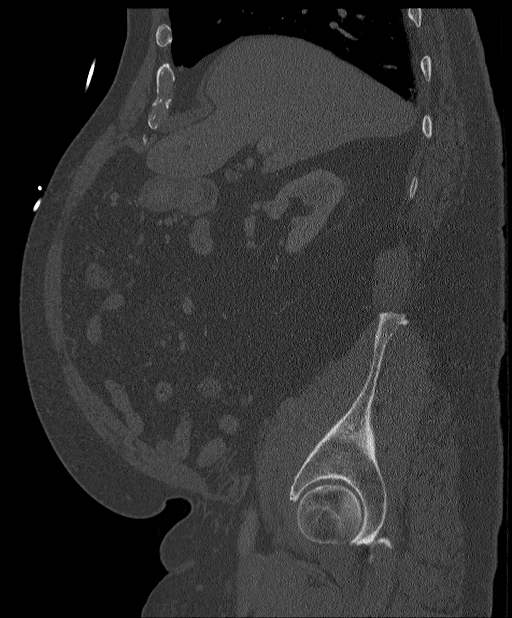
[im 81/193  bone]
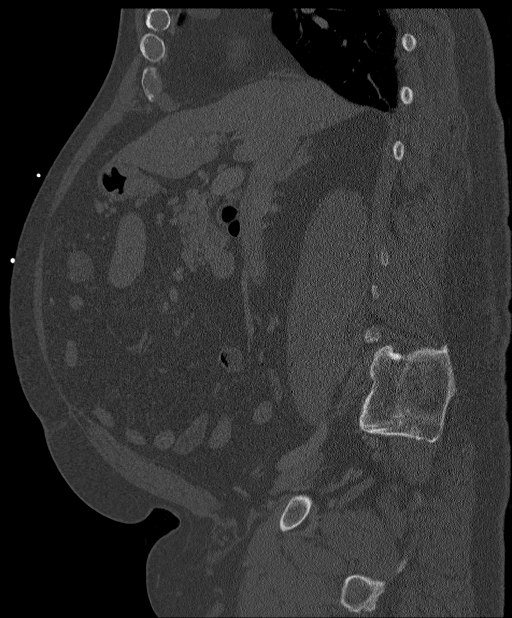
[im 97/193  soft-tissue]
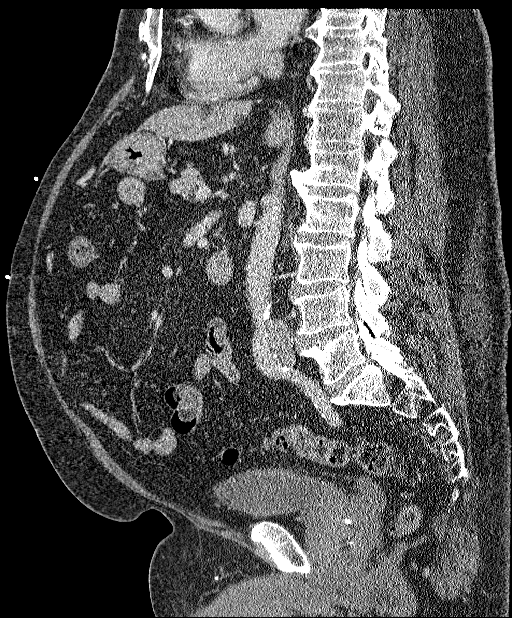
[im 97/193  bone]
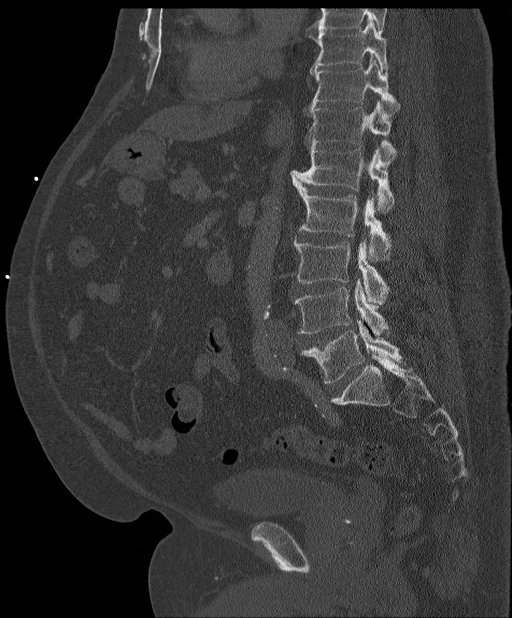
[im 113/193  bone]
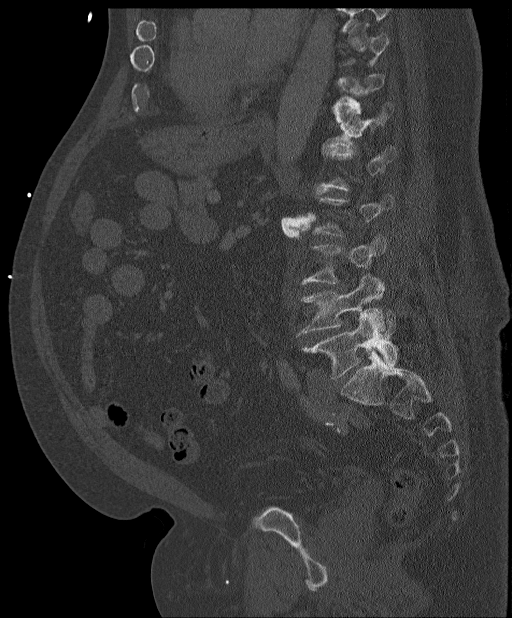
[im 129/193  bone]
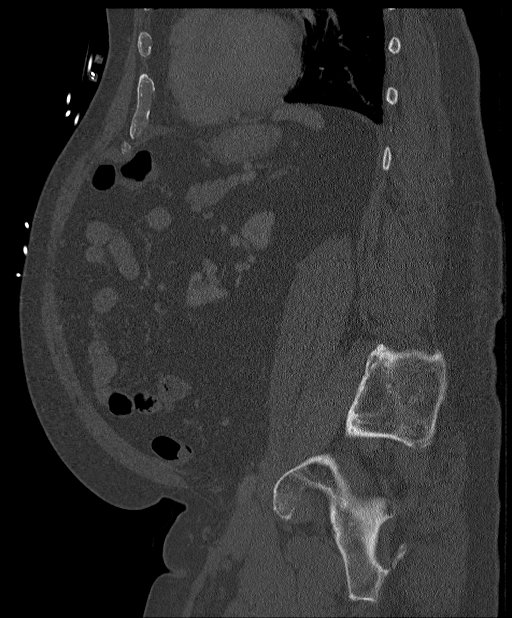

[Series 5: axial l-spine · axial · 0.36mm/px · z∈[+523,+708]mm · 5 of 57 slices shown, 7 images]
[im 10/57  soft-tissue]
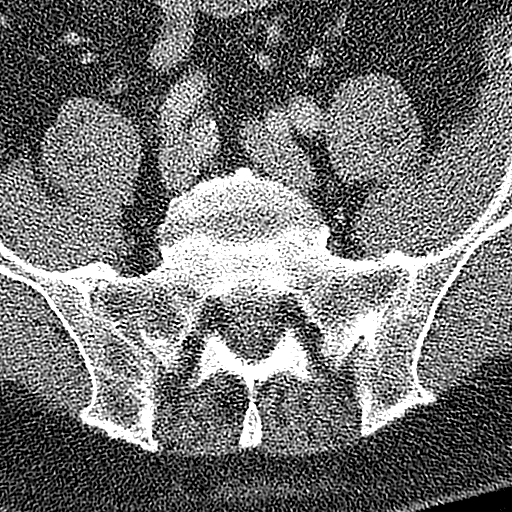
[im 10/57  bone]
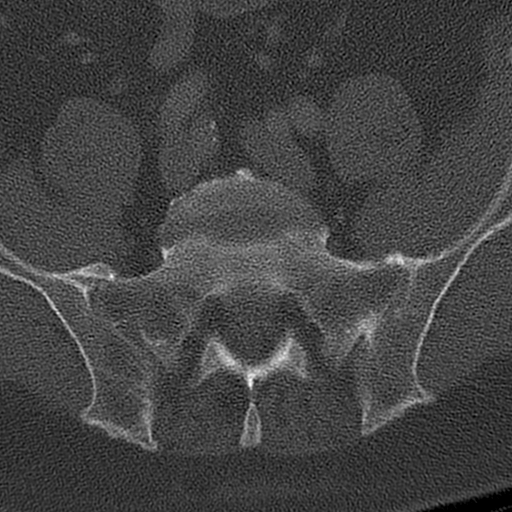
[im 19/57  bone]
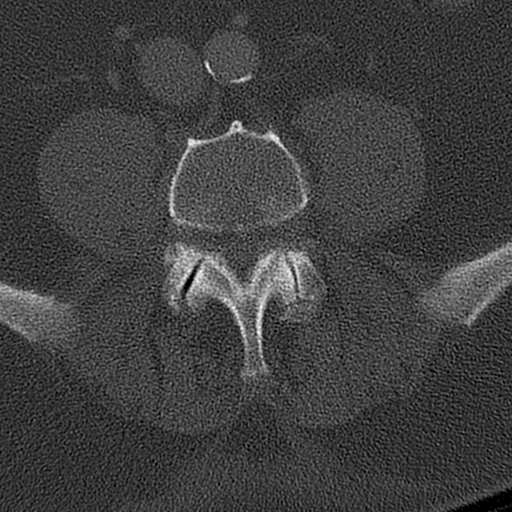
[im 29/57  bone]
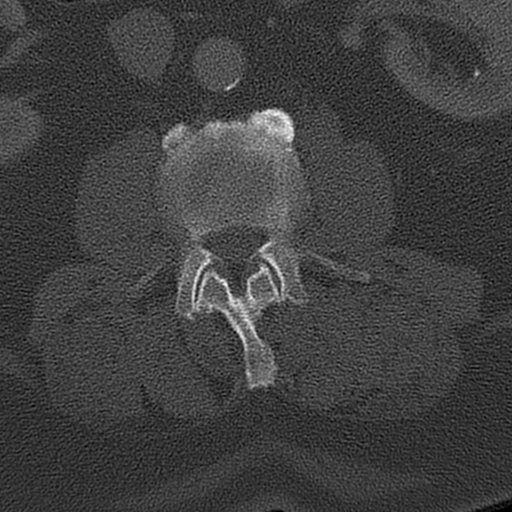
[im 38/57  bone]
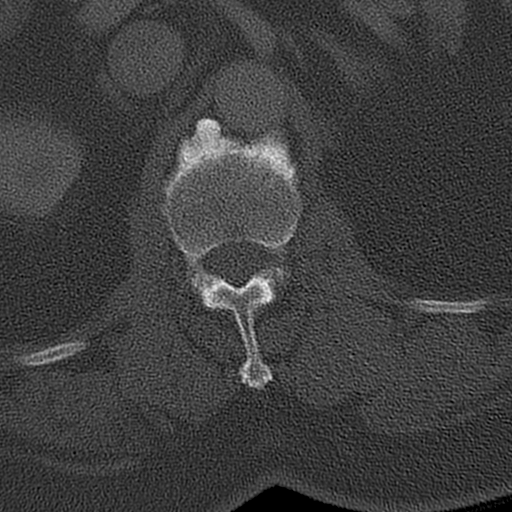
[im 47/57  soft-tissue]
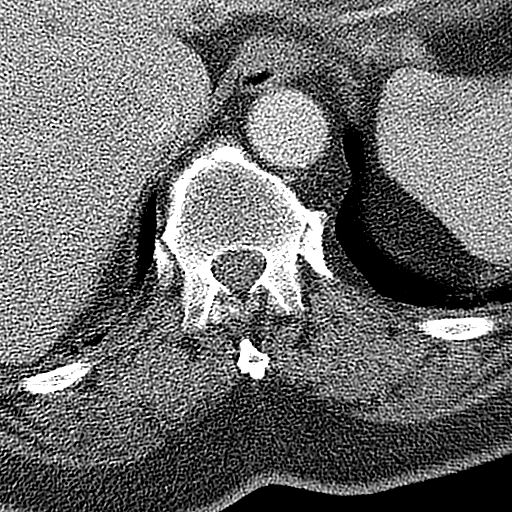
[im 47/57  bone]
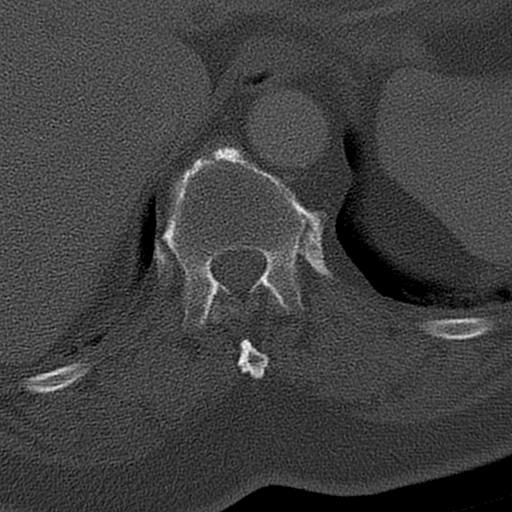

[13 of 33 positions shown; findings below may reference images not displayed]

FINDINGS: Segmentation: 5 lumbar type vertebrae

Alignment: Normal

Vertebrae: No evidence of fracture, bone lesion, or endplate
erosion. Sacroiliac ankylosis.

Paraspinal and other soft tissues: Reported separately

Disc levels: Osteophytes cause ankylosis from the thoracic spine (at
least T9) to L1. Bulky spurring at the more inferior endplates.
Degenerative facet spurring at L2-3 and below with marked bulky
spurring at L4-5 on both sides and on the right more than left at
L5-S1. The foramina appear diffusely patent. L4-5 spinal stenosis
that is moderate.
IMPRESSION: 1. No emergent finding.
2. Spondylosis which could be inflammatory as there is bilateral
sacroiliac ankylosis and spinal ankylosis from the thoracic spine to
L1.
3. Facet osteoarthritis at L3-4 and below with moderate spinal
stenosis at L4-5.

## 2020-12-31 DIAGNOSIS — I719 Aortic aneurysm of unspecified site, without rupture: Secondary | ICD-10-CM | POA: Diagnosis not present

## 2020-12-31 DIAGNOSIS — I712 Thoracic aortic aneurysm, without rupture: Secondary | ICD-10-CM | POA: Diagnosis not present

## 2020-12-31 DIAGNOSIS — I10 Essential (primary) hypertension: Secondary | ICD-10-CM | POA: Diagnosis not present

## 2020-12-31 DIAGNOSIS — E876 Hypokalemia: Secondary | ICD-10-CM | POA: Diagnosis not present

## 2020-12-31 DIAGNOSIS — N183 Chronic kidney disease, stage 3 unspecified: Secondary | ICD-10-CM | POA: Diagnosis not present

## 2020-12-31 DIAGNOSIS — K219 Gastro-esophageal reflux disease without esophagitis: Secondary | ICD-10-CM | POA: Diagnosis not present

## 2020-12-31 DIAGNOSIS — M5090 Cervical disc disorder, unspecified, unspecified cervical region: Secondary | ICD-10-CM | POA: Diagnosis not present

## 2020-12-31 DIAGNOSIS — J309 Allergic rhinitis, unspecified: Secondary | ICD-10-CM | POA: Diagnosis not present

## 2020-12-31 DIAGNOSIS — M1990 Unspecified osteoarthritis, unspecified site: Secondary | ICD-10-CM | POA: Diagnosis not present

## 2020-12-31 DIAGNOSIS — M109 Gout, unspecified: Secondary | ICD-10-CM | POA: Diagnosis not present

## 2020-12-31 DIAGNOSIS — E782 Mixed hyperlipidemia: Secondary | ICD-10-CM | POA: Diagnosis not present

## 2021-03-04 ENCOUNTER — Other Ambulatory Visit: Payer: Self-pay | Admitting: Cardiology

## 2021-03-04 DIAGNOSIS — E785 Hyperlipidemia, unspecified: Secondary | ICD-10-CM

## 2021-03-04 DIAGNOSIS — R0600 Dyspnea, unspecified: Secondary | ICD-10-CM

## 2021-03-04 DIAGNOSIS — R0609 Other forms of dyspnea: Secondary | ICD-10-CM

## 2021-03-04 DIAGNOSIS — R079 Chest pain, unspecified: Secondary | ICD-10-CM

## 2021-03-31 ENCOUNTER — Other Ambulatory Visit: Payer: Self-pay | Admitting: Internal Medicine

## 2021-03-31 ENCOUNTER — Ambulatory Visit
Admission: RE | Admit: 2021-03-31 | Discharge: 2021-03-31 | Disposition: A | Discharge status: Home / Self Care | Payer: Medicare Other | Source: Ambulatory Visit | Attending: Internal Medicine | Admitting: Internal Medicine

## 2021-03-31 DIAGNOSIS — E876 Hypokalemia: Secondary | ICD-10-CM | POA: Diagnosis not present

## 2021-03-31 DIAGNOSIS — E782 Mixed hyperlipidemia: Secondary | ICD-10-CM | POA: Diagnosis not present

## 2021-03-31 DIAGNOSIS — R0781 Pleurodynia: Secondary | ICD-10-CM

## 2021-03-31 DIAGNOSIS — R194 Change in bowel habit: Secondary | ICD-10-CM | POA: Diagnosis not present

## 2021-03-31 DIAGNOSIS — I712 Thoracic aortic aneurysm, without rupture: Secondary | ICD-10-CM | POA: Diagnosis not present

## 2021-03-31 DIAGNOSIS — N400 Enlarged prostate without lower urinary tract symptoms: Secondary | ICD-10-CM | POA: Diagnosis not present

## 2021-03-31 DIAGNOSIS — Z0000 Encounter for general adult medical examination without abnormal findings: Secondary | ICD-10-CM | POA: Diagnosis not present

## 2021-03-31 DIAGNOSIS — M109 Gout, unspecified: Secondary | ICD-10-CM | POA: Diagnosis not present

## 2021-03-31 DIAGNOSIS — Z1389 Encounter for screening for other disorder: Secondary | ICD-10-CM | POA: Diagnosis not present

## 2021-03-31 DIAGNOSIS — R7303 Prediabetes: Secondary | ICD-10-CM | POA: Diagnosis not present

## 2021-03-31 DIAGNOSIS — K219 Gastro-esophageal reflux disease without esophagitis: Secondary | ICD-10-CM | POA: Diagnosis not present

## 2021-03-31 DIAGNOSIS — M519 Unspecified thoracic, thoracolumbar and lumbosacral intervertebral disc disorder: Secondary | ICD-10-CM | POA: Diagnosis not present

## 2021-03-31 DIAGNOSIS — I10 Essential (primary) hypertension: Secondary | ICD-10-CM | POA: Diagnosis not present

## 2021-03-31 DIAGNOSIS — N183 Chronic kidney disease, stage 3 unspecified: Secondary | ICD-10-CM | POA: Diagnosis not present

## 2021-04-12 NOTE — Progress Notes (Signed)
Cardiology Office Note:    Date:  04/16/2021   ID:  Bob Elliott, DOB December 23, 1940, MRN [id number]  PCP:  [name], MD   [facility] Providers Cardiologist:  [name], MD (Inactive) {  Referring MD: [name], MD    History of Present Illness:    Bob Elliott is a 80 y.o. male with a hx of thoracic aortic aneurysm measuring 4/1 in 03/2019 and 4.6cm in 05/2020, HTN, HLD, and amaurosis fugax who was previously followed by Dr. Meda Coffee who now returns to clinic for CV follow-up.  Per review of the record, the patient has a history of amaurosis fugax and underwent cardiac monitor which showed SB-NSR and short runs NSVT 4 and 9 beats and no Afib. On 02/20/20, the patient was having DOE. Myoview normal LV function and no ischemia.Patient intolerant to ASA so started on Plavix b/c of recent TIA.  Last saw Dr. Meda Coffee on 10/28/20, he was doing well. Continued to have mild DOE. No HF symptoms. Has been following with Dr. Orvan Seen for ascending aortic aneurysm, which was stable at 16mm on last CTA in 11/2020.  Today, the patient states that he is doing well. He continues to have mild shortness of breath with exertion, which he attributes to deconditioning. Myoview 02/26/20 with normal EF, no evidence of ischemia. Has some right back pain as well which has been bothersome. Thinks this may be related to overuse/straining his arm while fishing. Otherwise,  blood pressure well controlled at home 120/70s. No chest pain, orthopnea, PND, lightheadedness or dizziness. Has been exercising using TV program 2-3x/week. Also trying to ride his stationary bike 10-48min a couple times of week.   Past Medical History:  Diagnosis Date  . Arthritis   . BPH (benign prostatic hyperplasia)   . CKD (chronic kidney disease), stage II    a. CKD II-III by labs.  . Dizziness 10/21/2016  . Esophageal dysmotility   . Essential hypertension    Trial off acei 08/12/2017 for unexplained sob/ noct smothering   .  GERD (gastroesophageal reflux disease)   . GI bleed   . Glaucoma    BOTH EYES  . Hyperlipidemia   . Hypertension   . Inguinal hernia without mention of obstruction or gangrene, unilateral or unspecified, (not specified as recurrent)-right s/p repair with mesh 05/21/2014  . Schatzki's ring   . Sinus bradycardia    a. prompting dc of Diltiazem (?chronotropic incompetence) in 2017.  [name] Sleep apnea    "mild" no cpap  . Thoracic aortic aneurysm (TAA) ([place])     Past Surgical History:  Procedure Laterality Date  . COLONOSCOPY WITH PROPOFOL N/A 05/11/2016   Procedure: COLONOSCOPY WITH PROPOFOL;  Surgeon: Garlan Fair, MD;  Location: [facility];  Service: Endoscopy;  Laterality: N/A;  . EYE SURGERY     CATARACT R  EYE  . HERNIA REPAIR     UMBILICAL  . INGUINAL HERNIA REPAIR Right 06/18/2014   Procedure: RIGHT INGUINAL HERNIA REPAIR;  Surgeon: Odis Hollingshead, MD;  Location: [place];  Service: General;  Laterality: Right;  . INSERTION OF MESH Right 06/18/2014   Procedure: INSERTION OF MESH;  Surgeon: Odis Hollingshead, MD;  Location: [place];  Service: General;  Laterality: Right;  . NECK SURGERY  2000   cervical disc surgery(limited ROM)- retained hardware  . TONSILLECTOMY      Current Medications: Current Meds  Medication Sig  . amLODipine (NORVASC) 10 MG tablet TAKE 1 TABLET DAILY  . atorvastatin (LIPITOR)  10 MG tablet Take 10 mg by mouth 2 (two) times a week.  . clopidogrel (PLAVIX) 75 MG tablet TAKE 1 TABLET BY MOUTH EVERY DAY  . doxazosin (CARDURA) 4 MG tablet Take 4 mg by mouth at bedtime.   . fluticasone (FLONASE) 50 MCG/ACT nasal spray Place 2 sprays into both nostrils daily as needed for allergies or rhinitis.   [name] irbesartan (AVAPRO) 150 MG tablet TAKE 1 TABLET DAILY (DOSE DECREASE)  . loratadine (CLARITIN) 10 MG tablet Take 10 mg by mouth daily.  . potassium chloride (KLOR-CON) 10 MEQ tablet Take 3 tablets (30 mEq total) by mouth 2 (two) times daily.  . solifenacin (VESICARE)  5 MG tablet Take 5 mg by mouth daily.  [name] spironolactone (ALDACTONE) 25 MG tablet TAKE ONE-HALF (1/2) TABLET DAILY  . timolol (TIMOPTIC) 0.5 % ophthalmic solution Place 1 drop into both eyes daily.      Allergies:   Aspirin, Codeine, and Shellfish allergy   Social History   Socioeconomic History  . Marital status: Married    Spouse name: Not on file  . Number of children: 4  . Years of education: Not on file  . Highest education level: Not on file  Occupational History  . Occupation: retired  Tobacco Use  . Smoking status: Never Smoker  . Smokeless tobacco: Never Used  Vaping Use  . Vaping Use: Never used  Substance and Sexual Activity  . Alcohol use: Yes    Comment: none since 80's  . Drug use: No  . Sexual activity: Not Currently  Other Topics Concern  . Not on file  Social History Narrative  . Not on file   Social Determinants of Health   Financial Resource Strain: Not on file  Food Insecurity: Not on file  Transportation Needs: Not on file  Physical Activity: Not on file  Stress: Not on file  Social Connections: Not on file     Family History: The patient's family history includes Cancer in his brother; Diabetes in his sister; Heart disease in his mother; Leukemia in his maternal grandmother; Stroke in his father.  ROS:   Please see the history of present illness.    Review of Systems  Constitutional: Negative for chills and fever.  HENT: Negative for hearing loss.   Eyes: Negative for blurred vision.  Respiratory: Positive for shortness of breath.   Cardiovascular: Negative for chest pain, palpitations, orthopnea, claudication, leg swelling and PND.  Gastrointestinal: Negative for melena.  Genitourinary: Negative for dysuria and hematuria.  Musculoskeletal: Positive for back pain and myalgias.  Neurological: Negative for dizziness and loss of consciousness.  Psychiatric/Behavioral: Negative for substance abuse.    EKGs/Labs/Other Studies Reviewed:     The following studies were reviewed today: Myoview 02/26/20:  Nuclear stress EF: 58%.  The left ventricular ejection fraction is normal (55-65%).  There was no ST segment deviation noted during stress.  This is a low risk study.   Apical thinning not thought to be significant No ischemia EF 58%  Cardiac Monitor 11/2019:   Sinus bradycardia to sinus rhythm.  Two very short runs of nsVT - 4 and 9 beats.   Sinus bradycardia to sinus rhythm. Two very short runs of nsVT - 4 and 9 beats. No atrial fibrillation.  TTE 09/2019: IMPRESSIONS  1. Left ventricular ejection fraction, by visual estimation, is 60 to  65%. The left ventricle has normal function. There is mildly increased  left ventricular hypertrophy.  2. Left ventricular diastolic parameters are consistent with  Grade I  diastolic dysfunction (impaired relaxation).  3. The left ventricle has no regional wall motion abnormalities.  4. Global right ventricle has normal systolic function.The right  ventricular size is normal. No increase in right ventricular wall  thickness.  5. Left atrial size was normal.  6. Right atrial size was normal.  7. The mitral valve is normal in structure. No evidence of mitral valve  regurgitation. No evidence of mitral stenosis.  8. The tricuspid valve is normal in structure. Tricuspid valve  regurgitation is not demonstrated.  9. The aortic valve is tricuspid. Aortic valve regurgitation is mild. No  evidence of aortic valve sclerosis or stenosis.  10. There is mild dilatation of the aortic root measuring 41 mm.  11. TR signal is inadequate for assessing pulmonary artery systolic  pressure.  12. The inferior vena cava is normal in size with greater than 50%  respiratory variability, suggesting right atrial pressure of 3 mmHg.    Carotid US 09/20/2019 Summary: Right Carotid: Velocities in the right ICA are consistent with a 1-39% stenosis. Left Carotid: Velocities in the left  ICA are consistent with a 1-39% stenosis. Vertebrals: Bilateral vertebral arteries demonstrate antegrade flow.  Chest CTA 03/26/2019 IMPRESSION: 1. No significant change in caliber of the thoracic aorta, measuring approximately 4.1 x 4.0 cm in the tubular ascending thoracic aorta, previously 4.3 x 4.2 cm when measured similarly. The aortic root is normal in caliber. The sinuses of Valsalva are enlarged up to 4.3 cm. The distal aortic arch and descending thoracic aorta are normal in caliber. Minimal atherosclerosis.  Chest CTA 05/2020 Interval increase in size of known thoracic aortic aneurysm now measuring up to 4.6 cm (previously measured 4.0 x 4.0 cm). Greater than 5 mm growth over the past 12 months is associated with an increased risk of aneurysm rupture. 2. Diffuse bilateral bronchial wall thickening, consistent with nonspecific infectious or inflammatory bronchitis.  Myoview 09/06/16  Nuclear stress EF: 53%.  Blood pressure demonstrated a normal response to exercise.  There was no ST segment deviation noted during stress.  This is a low risk study.  The left ventricular ejection fraction is mildly decreased (45-54%).  Inferior and apical thinning consistent with soft tissue attenuaion (diaphragm), cannot exclude small subendocardial scar No ichemia  Echocardiogram 09/06/16 Mild septal hypertrophy with mod post wall hypertrophy, EF 55-60, no RWMA, Gr 1 DD, mild AI, trivial MR, mild RAE, GLS -17.2%   EKG:  No new ECG today  Recent Labs: 10/28/2020: BUN 19; Potassium 4.9; Sodium 140 12/04/2020: Creatinine, Ser 1.30  Recent Lipid Panel    Component Value Date/Time   CHOL 96 (L) 02/20/2020 0937   TRIG 75 02/20/2020 0937   HDL 46 02/20/2020 0937   CHOLHDL 2.1 02/20/2020 0937   CHOLHDL 4.0 09/20/2019 0740   VLDL 19 09/20/2019 0740   LDLCALC 34 02/20/2020 0937      Physical Exam:    VS:  BP 128/74   Pulse 96   Ht 5\' 8"  (1.727 m)   Wt 210 lb 12.8 oz (95.6  kg)   SpO2 99%   BMI 32.05 kg/m     Wt Readings from Last 3 Encounters:  04/16/21 210 lb 12.8 oz (95.6 kg)  12/22/20 211 lb (95.7 kg)  10/28/20 212 lb 3.2 oz (96.3 kg)     GEN:  Well nourished, well developed in no acute distress HEENT: Normal NECK: No JVD; No carotid bruits CARDIAC: RRR, no murmurs, rubs, gallops RESPIRATORY:  Clear to auscultation without  rales, wheezing or rhonchi  ABDOMEN: Soft, non-tender, non-distended MUSCULOSKELETAL:  No edema; No deformity  SKIN: Warm and dry NEUROLOGIC:  Alert and oriented x 3 PSYCHIATRIC:  Normal affect   ASSESSMENT:    1. Chronic dyspnea   2. Essential hypertension   3. Hyperlipidemia, unspecified hyperlipidemia type   4. Thoracic aortic aneurysm without rupture ([place])   5. Amaurosis fugax    PLAN:    In order of problems listed above:  #DOE: Improved. Myoview negative for ischemia or infarction. Suspect secondary to deconditioning given reassuring CV workup. -Negative ischemic work-up -Discussed importance of continued exercise to help with conditioning with goal 11min moderate intensity exercise per week  #Thoracic Aortic Aneurysm: Measured 4.1cm on 03/2019, 4.6cm on 05/2020, and 4.3cm on 11/2020. Follows with Dr. Orvan Seen. -Continue irbesartan 150mg  daily -Continue lipitor 10mg  2x/week -Follow-up with Dr. Orvan Seen as scheduled  #HTN: Well controlled and at goal <120/80s. -Continue amlodipine 10mg  daily -Continue irbesartan 150mg  daily -Continue spironolactone 12.5mg  daily  #HLD: LDL 81 03/2021. Will watch and continue lifestyle modifications. Ideally, goal would be <70 given history of amaurosis fugax. -Continue lipitor 10mg  2x/week -Continue lifestyle modifications with diet and increased exercise -Can consider zetia 10mg  daily in the future if LDL remains >70  #Amaurosis Fugax: 30day monitor without Afib. No recurrence of Afib -On plavix 75mg  daily -Continue lipitor 10mg  2x/week -Intolerant of  ASA    Medication Adjustments/Labs and Tests Ordered: Current medicines are reviewed at length with the patient today.  Concerns regarding medicines are outlined above.  No orders of the defined types were placed in this encounter.  No orders of the defined types were placed in this encounter.   Patient Instructions  Medication Instructions:  none *If you need a refill on your cardiac medications before your next appointment, please call your pharmacy*   Lab Work: none If you have labs (blood work) drawn today and your tests are completely normal, you will receive your results only by: [name] MyChart Message (if you have MyChart) OR . A paper copy in the mail If you have any lab test that is abnormal or we need to change your treatment, we will call you to review the results.   Testing/Procedures: none   Follow-Up: At [facility], you and your health needs are our priority.  As part of our continuing mission to provide you with exceptional heart care, we have created designated Provider Care Teams.  These Care Teams include your primary Cardiologist (physician) and Advanced Practice Providers (APPs -  Physician Assistants and Nurse Practitioners) who all work together to provide you with the care you need, when you need it.  We recommend signing up for the patient portal called "MyChart".  Sign up information is provided on this After Visit Summary.  MyChart is used to connect with patients for Virtual Visits (Telemedicine).  Patients are able to view lab/test results, encounter notes, upcoming appointments, etc.  Non-urgent messages can be sent to your provider as well.   To learn more about what you can do with MyChart, go to [email or web address].    Your next appointment:   6 month(s)  The format for your next appointment:   In Person  Provider:   Gwyndolyn Kaufman, MD   Other Instructions none    Signed, Freada Bergeron, MD  04/16/2021 10:05 AM    [place]

## 2021-04-16 ENCOUNTER — Ambulatory Visit (INDEPENDENT_AMBULATORY_CARE_PROVIDER_SITE_OTHER): Payer: Medicare Other | Admitting: Cardiology

## 2021-04-16 ENCOUNTER — Other Ambulatory Visit: Payer: Self-pay

## 2021-04-16 ENCOUNTER — Encounter: Payer: Self-pay | Admitting: Cardiology

## 2021-04-16 VITALS — BP 128/74 | HR 96 | Ht 68.0 in | Wt 210.8 lb

## 2021-04-16 DIAGNOSIS — I712 Thoracic aortic aneurysm, without rupture, unspecified: Secondary | ICD-10-CM

## 2021-04-16 DIAGNOSIS — R0609 Other forms of dyspnea: Secondary | ICD-10-CM | POA: Diagnosis not present

## 2021-04-16 DIAGNOSIS — G453 Amaurosis fugax: Secondary | ICD-10-CM

## 2021-04-16 DIAGNOSIS — I10 Essential (primary) hypertension: Secondary | ICD-10-CM

## 2021-04-16 DIAGNOSIS — E785 Hyperlipidemia, unspecified: Secondary | ICD-10-CM | POA: Diagnosis not present

## 2021-04-16 NOTE — Patient Instructions (Signed)
Medication Instructions:  none *If you need a refill on your cardiac medications before your next appointment, please call your pharmacy*   Lab Work: none If you have labs (blood work) drawn today and your tests are completely normal, you will receive your results only by: [name] MyChart Message (if you have MyChart) OR . A paper copy in the mail If you have any lab test that is abnormal or we need to change your treatment, we will call you to review the results.   Testing/Procedures: none   Follow-Up: At [facility], you and your health needs are our priority.  As part of our continuing mission to provide you with exceptional heart care, we have created designated Provider Care Teams.  These Care Teams include your primary Cardiologist (physician) and Advanced Practice Providers (APPs -  Physician Assistants and Nurse Practitioners) who all work together to provide you with the care you need, when you need it.  We recommend signing up for the patient portal called "MyChart".  Sign up information is provided on this After Visit Summary.  MyChart is used to connect with patients for Virtual Visits (Telemedicine).  Patients are able to view lab/test results, encounter notes, upcoming appointments, etc.  Non-urgent messages can be sent to your provider as well.   To learn more about what you can do with MyChart, go to [email or web address].    Your next appointment:   6 month(s)  The format for your next appointment:   In Person  Provider:   Gwyndolyn Kaufman, MD   Other Instructions none

## 2021-04-30 ENCOUNTER — Ambulatory Visit: Payer: Medicare Other | Admitting: Cardiology

## 2021-05-04 ENCOUNTER — Other Ambulatory Visit: Payer: Self-pay | Admitting: *Deleted

## 2021-05-04 ENCOUNTER — Telehealth: Payer: Self-pay | Admitting: Cardiology

## 2021-05-04 MED ORDER — IRBESARTAN 150 MG PO TABS: ORAL_TABLET | ORAL | 2 refills | Status: DC

## 2021-05-04 MED ORDER — ATORVASTATIN CALCIUM 10 MG PO TABS: 10.0000 mg | ORAL_TABLET | ORAL | 3 refills | Status: DC

## 2021-05-04 MED ORDER — ATORVASTATIN CALCIUM 10 MG PO TABS: 10.0000 mg | ORAL_TABLET | ORAL | 0 refills | Status: DC

## 2021-05-04 NOTE — Telephone Encounter (Signed)
Rx's have been sent in as requested. Confirmation received.

## 2021-05-04 NOTE — Telephone Encounter (Signed)
*  STAT* If patient is at the pharmacy, call can be transferred to refill team.   1. Which medications need to be refilled? (please list name of each medication and dose if known) atorvastatin (LIPITOR) 10 MG tablet  2. Which pharmacy/location (including street and city if local pharmacy) is medication to be sent to?   [id number] - [place], [place] - [address] AT [place], [place] - [address]   3. Do they need a 30 day or 90 day supply? 10 day and 90 day   Patient states he has enough for Thursday. [facility] [address] and a 90 day sent to [facility]

## 2021-05-05 ENCOUNTER — Other Ambulatory Visit: Payer: Self-pay

## 2021-05-05 MED ORDER — IRBESARTAN 150 MG PO TABS: ORAL_TABLET | ORAL | 0 refills | Status: DC

## 2021-05-05 NOTE — Telephone Encounter (Signed)
Pt's medication was sent to pt's pharmacy as requested. Confirmation received.  °

## 2021-06-04 DIAGNOSIS — M25561 Pain in right knee: Secondary | ICD-10-CM | POA: Diagnosis not present

## 2021-06-04 DIAGNOSIS — M25461 Effusion, right knee: Secondary | ICD-10-CM | POA: Diagnosis not present

## 2021-06-16 DIAGNOSIS — H401131 Primary open-angle glaucoma, bilateral, mild stage: Secondary | ICD-10-CM | POA: Diagnosis not present

## 2021-06-24 ENCOUNTER — Other Ambulatory Visit: Payer: Self-pay

## 2021-06-24 ENCOUNTER — Emergency Department (HOSPITAL_COMMUNITY): Payer: Medicare Other

## 2021-06-24 ENCOUNTER — Emergency Department (HOSPITAL_COMMUNITY)
Admission: EM | Admit: 2021-06-24 | Discharge: 2021-06-24 | Disposition: A | Discharge status: Home / Self Care | Payer: Medicare Other | Attending: Emergency Medicine | Admitting: Emergency Medicine

## 2021-06-24 ENCOUNTER — Encounter (HOSPITAL_COMMUNITY): Payer: Self-pay | Admitting: Emergency Medicine

## 2021-06-24 DIAGNOSIS — R0602 Shortness of breath: Secondary | ICD-10-CM | POA: Diagnosis not present

## 2021-06-24 DIAGNOSIS — Z79899 Other long term (current) drug therapy: Secondary | ICD-10-CM | POA: Diagnosis not present

## 2021-06-24 DIAGNOSIS — I129 Hypertensive chronic kidney disease with stage 1 through stage 4 chronic kidney disease, or unspecified chronic kidney disease: Secondary | ICD-10-CM | POA: Diagnosis not present

## 2021-06-24 DIAGNOSIS — J069 Acute upper respiratory infection, unspecified: Secondary | ICD-10-CM | POA: Diagnosis not present

## 2021-06-24 DIAGNOSIS — B9789 Other viral agents as the cause of diseases classified elsewhere: Secondary | ICD-10-CM | POA: Diagnosis not present

## 2021-06-24 DIAGNOSIS — R000 Tachycardia, unspecified: Secondary | ICD-10-CM | POA: Diagnosis not present

## 2021-06-24 DIAGNOSIS — N182 Chronic kidney disease, stage 2 (mild): Secondary | ICD-10-CM | POA: Diagnosis not present

## 2021-06-24 DIAGNOSIS — R059 Cough, unspecified: Secondary | ICD-10-CM | POA: Diagnosis not present

## 2021-06-24 DIAGNOSIS — Z20822 Contact with and (suspected) exposure to covid-19: Secondary | ICD-10-CM | POA: Diagnosis not present

## 2021-06-24 DIAGNOSIS — Z7902 Long term (current) use of antithrombotics/antiplatelets: Secondary | ICD-10-CM | POA: Diagnosis not present

## 2021-06-24 LAB — COMPREHENSIVE METABOLIC PANEL
ALT: 19 U/L (ref 0–44)
AST: 23 U/L (ref 15–41)
Albumin: 3.5 g/dL (ref 3.5–5.0)
Alkaline Phosphatase: 52 U/L (ref 38–126)
Anion gap: 10 (ref 5–15)
BUN: 16 mg/dL (ref 8–23)
CO2: 22 mmol/L (ref 22–32)
Calcium: 8.6 mg/dL — ABNORMAL LOW (ref 8.9–10.3)
Chloride: 99 mmol/L (ref 98–111)
Creatinine, Ser: 1.38 mg/dL — ABNORMAL HIGH (ref 0.61–1.24)
GFR, Estimated: 51 mL/min — ABNORMAL LOW (ref >60)
Glucose, Bld: 161 mg/dL — ABNORMAL HIGH (ref 70–99)
Potassium: 4.4 mmol/L (ref 3.5–5.1)
Sodium: 131 mmol/L — ABNORMAL LOW (ref 135–145)
Total Bilirubin: 0.6 mg/dL (ref 0.3–1.2)
Total Protein: 6.9 g/dL (ref 6.5–8.1)

## 2021-06-24 LAB — CBC WITH DIFFERENTIAL/PLATELET
Abs Immature Granulocytes: 0.04 10*3/uL (ref 0.00–0.07)
Basophils Absolute: 0 10*3/uL (ref 0.0–0.1)
Basophils Relative: 0 %
Eosinophils Absolute: 0 10*3/uL (ref 0.0–0.5)
Eosinophils Relative: 0 %
HCT: 43 % (ref 39.0–52.0)
Hemoglobin: 14.3 g/dL (ref 13.0–17.0)
Immature Granulocytes: 1 %
Lymphocytes Relative: 6 %
Lymphs Abs: 0.4 10*3/uL — ABNORMAL LOW (ref 0.7–4.0)
MCH: 30.3 pg (ref 26.0–34.0)
MCHC: 33.3 g/dL (ref 30.0–36.0)
MCV: 91.1 fL (ref 80.0–100.0)
Monocytes Absolute: 0.3 10*3/uL (ref 0.1–1.0)
Monocytes Relative: 4 %
Neutro Abs: 6.1 10*3/uL (ref 1.7–7.7)
Neutrophils Relative %: 89 %
Platelets: 204 10*3/uL (ref 150–400)
RBC: 4.72 MIL/uL (ref 4.22–5.81)
RDW: 12.9 % (ref 11.5–15.5)
WBC: 6.8 10*3/uL (ref 4.0–10.5)
nRBC: 0 % (ref 0.0–0.2)

## 2021-06-24 LAB — RESP PANEL BY RT-PCR (FLU A&B, COVID) ARPGX2
Influenza A by PCR: NEGATIVE
Influenza B by PCR: NEGATIVE
SARS Coronavirus 2 by RT PCR: NEGATIVE

## 2021-06-24 MED ORDER — ALBUTEROL SULFATE HFA 108 (90 BASE) MCG/ACT IN AERS
2.0000 | INHALATION_SPRAY | RESPIRATORY_TRACT | Status: DC | PRN
  Administered 2021-06-24: 2 via RESPIRATORY_TRACT
  Filled 2021-06-24: qty 6.7

## 2021-06-24 MED ORDER — GUAIFENESIN-DM 100-10 MG/5ML PO SYRP: 5.0000 mL | ORAL_SOLUTION | Freq: Three times a day (TID) | ORAL | 0 refills | Status: AC | PRN

## 2021-06-24 NOTE — ED Notes (Signed)
PT transported to X-Ray, X-ray stated they would take pt to [facility] 28 when finished.

## 2021-06-24 NOTE — ED Provider Notes (Signed)
Emergency Medicine Provider Triage Evaluation Note  COBAIN KRIKORIAN , a 80 y.o. male  was evaluated in triage.  Pt complains of sob. Pt is concerned he has covid. He states his wife was dx with covid last week and he has continued to test negative but has had sob and cough. He contacted his pcp who put him on prednisone and paxlovid. He has been on that for several days and has not had improvement of sxs  Review of Systems  Positive: Cough, sob Negative: fever  Physical Exam  BP 131/67 (BP Location: Right Arm)   Pulse (!) 58   Temp 98.5 F (36.9 C) (Oral)   Resp 20   SpO2 96%  Gen:   Awake, no distress   Resp:  Normal effort  MSK:   Moves extremities without difficulty Other:  Dry cough, lungs ctab, heart with rrr  Medical Decision Making  Medically screening exam initiated at 3:46 PM.  Appropriate orders placed.  [name] was informed that the remainder of the evaluation will be completed by another provider, this initial triage assessment does not replace that evaluation, and the importance of remaining in the ED until their evaluation is complete.     Bishop Dublin 06/24/21 1548    Milton Ferguson, MD 06/28/21 [phone]

## 2021-06-24 NOTE — Discharge Instructions (Addendum)
Your COVID test was negative but I am still worried that it is a false negative.  I would continue to isolate yourself for 5 days after symptoms began and then another 5 days wearing a mask.  Cough medicines may help.  Follow-up with your doctor as needed

## 2021-06-24 NOTE — ED Notes (Signed)
Pt alert, NAD, calm, interactive, resps e/u, speaking in clear complete sentences. Sitting in chair.

## 2021-06-24 NOTE — ED Notes (Signed)
Patient transported to X-ray 

## 2021-06-24 NOTE — ED Provider Notes (Signed)
[facility] EMERGENCY DEPARTMENT Provider Note   CSN: [id number] Arrival date & time: 06/24/21  1527     History Chief Complaint  Patient presents with   Shortness of Breath   Cough    Bob Elliott is a 80 y.o. male.   Shortness of Breath Associated symptoms: chest pain and cough   Associated symptoms: no abdominal pain   Cough Associated symptoms: chest pain and shortness of breath   Patient presents with shortness of breath and cough.  Patient's wife has documented case of COVID.  Patient has had symptoms since Friday with today being Wednesday.  States that he called his doctor who did not test him but started him on prednisone and Paxil bid.  States that he has been feeling more short of breath.  States that his breathing feels around 85%.  Cough with some sputum production.  Some dull chest pain.  No fevers.  Does have myalgias.    Past Medical History:  Diagnosis Date   Arthritis    BPH (benign prostatic hyperplasia)    CKD (chronic kidney disease), stage II    a. CKD II-III by labs.   Dizziness 10/21/2016   Esophageal dysmotility    Essential hypertension    Trial off acei 08/12/2017 for unexplained sob/ noct smothering    GERD (gastroesophageal reflux disease)    GI bleed    Glaucoma    BOTH EYES   Hyperlipidemia    Hypertension    Inguinal hernia without mention of obstruction or gangrene, unilateral or unspecified, (not specified as recurrent)-right s/p repair with mesh 05/21/2014   Schatzki's ring    Sinus bradycardia    a. prompting dc of Diltiazem (?chronotropic incompetence) in 2017.   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   Upper airway cough syndrome 04/17/2019   Thoracic aortic aneurysm ([place]) 07/21/2018   DOE (dyspnea on  exertion) 10/21/2016   Dizziness 10/21/2016   Sinus bradycardia 10/21/2016   Sleep apnea    Schatzki's ring    Essential hypertension    Hyperlipidemia    Esophageal dysmotility    GERD (gastroesophageal reflux disease)    Inguinal hernia without mention of obstruction or gangrene, unilateral or unspecified, (not specified as recurrent)-right s/p repair with mesh 05/21/2014    Past Surgical History:  Procedure Laterality Date   COLONOSCOPY WITH PROPOFOL N/A 05/11/2016   Procedure: COLONOSCOPY WITH PROPOFOL;  Surgeon: Garlan Fair, MD;  Location: [facility];  Service: Endoscopy;  Laterality: N/A;   EYE SURGERY     CATARACT R  EYE   HERNIA REPAIR     UMBILICAL   INGUINAL HERNIA REPAIR Right 06/18/2014   Procedure: RIGHT INGUINAL HERNIA REPAIR;  Surgeon: Odis Hollingshead, MD;  Location: [place];  Service: General;  Laterality: Right;   INSERTION OF MESH Right 06/18/2014   Procedure: INSERTION OF MESH;  Surgeon: Odis Hollingshead, MD;  Location: [place];  Service: General;  Laterality: Right;   NECK SURGERY  2000   cervical disc surgery(limited ROM)- retained hardware   TONSILLECTOMY         Family History  Problem Relation Age of Onset   Heart disease Mother    Stroke Father    Cancer Brother  colon   Diabetes Sister    Leukemia Maternal Grandmother     Social History   Tobacco Use   Smoking status: Never   Smokeless tobacco: Never  Vaping Use   Vaping Use: Never used  Substance Use Topics   Alcohol use: Yes    Comment: none since 80's   Drug use: No    Home Medications Prior to Admission medications   Medication Sig Start Date End Date Taking? Authorizing Provider  amLODipine (NORVASC) 10 MG tablet TAKE 1 TABLET DAILY 12/08/20  Yes Dorothy Spark, MD  atorvastatin (LIPITOR) 10 MG tablet Take 1 tablet (10 mg total) by mouth 2 (two) times a week. Patient taking differently: Take 10 mg by mouth 2 (two) times a week. Takes on Tuesday and Saturday 05/04/21   Yes Freada Bergeron, MD  clopidogrel (PLAVIX) 75 MG tablet TAKE 1 TABLET BY MOUTH EVERY DAY 03/04/21  Yes Dorothy Spark, MD  doxazosin (CARDURA) 4 MG tablet Take 4 mg by mouth at bedtime.  02/12/14  Yes [provider]  fluticasone (FLONASE) 50 MCG/ACT nasal spray Place 2 sprays into both nostrils daily as needed for allergies or rhinitis.  03/28/14  Yes [provider]  guaiFENesin-dextromethorphan (ROBITUSSIN DM) 100-10 MG/5ML syrup Take 5 mLs by mouth 3 (three) times daily as needed for cough. 06/24/21  Yes Davonna Belling, MD  irbesartan (AVAPRO) 150 MG tablet TAKE 1 TABLET DAILY (DOSE DECREASE) 05/05/21  Yes Freada Bergeron, MD  loratadine (CLARITIN) 10 MG tablet Take 10 mg by mouth daily.   Yes [provider]  PAXLOVID 20 x 150 MG & 10 x '100MG'$  TBPK Take 3 tablets by mouth 2 (two) times daily. 06/21/21  Yes [provider]  potassium chloride (KLOR-CON) 10 MEQ tablet Take 3 tablets (30 mEq total) by mouth 2 (two) times daily. 10/13/20  Yes Dorothy Spark, MD  predniSONE (DELTASONE) 5 MG tablet Take 10-70 mg by mouth daily. Take 7 tab po on Monday after meal. Take 6 tab po on Tuesday after meal. Take 5 tab po on Wednesday after meal. Take 4 tab po on Thursday after meal. Take 3 tab po on Friday after meal. Take 2 tab po on Saturday after meal. Take 1 tab po on Sunday after meal. Started 06/22/21 End 06/28/21. 06/21/21  Yes [provider]  solifenacin (VESICARE) 5 MG tablet Take 5 mg by mouth daily. 05/23/20  Yes [provider]  spironolactone (ALDACTONE) 25 MG tablet TAKE ONE-HALF (1/2) TABLET DAILY 12/08/20  Yes Dorothy Spark, MD  timolol (TIMOPTIC) 0.5 % ophthalmic solution Place 1 drop into both eyes daily.  11/25/17  Yes [provider]    Allergies    Aspirin, Codeine, and Shellfish allergy  Review of Systems   Review of Systems  Constitutional:  Positive for fatigue. Negative for appetite change.  Respiratory:   Positive for cough and shortness of breath.   Cardiovascular:  Positive for chest pain.  Gastrointestinal:  Negative for abdominal pain.  Genitourinary:  Negative for flank pain.  Musculoskeletal:  Negative for back pain.  Neurological:  Negative for weakness.  Psychiatric/Behavioral:  Negative for confusion.    Physical Exam Updated Vital Signs BP (!) 128/93   Pulse 73   Temp (!) 97.5 F (36.4 C) (Oral)   Resp (!) 24   Ht '5\' 8"'$  (1.727 m)   Wt 93 kg   SpO2 92%   BMI 31.17 kg/m   Physical Exam Vitals and nursing  note reviewed.  HENT:     Head: Normocephalic.  Cardiovascular:     Rate and Rhythm: Normal rate and regular rhythm.  Pulmonary:     Comments: Diffuse harsh breath sounds without respiratory distress. Chest:     Chest wall: No tenderness.  Abdominal:     Tenderness: There is no abdominal tenderness.  Musculoskeletal:     Cervical back: Neck supple.     Right lower leg: No edema.     Left lower leg: No edema.  Skin:    Capillary Refill: Capillary refill takes less than 2 seconds.  Neurological:     Mental Status: He is alert and oriented to person, place, and time.    ED Results / Procedures / Treatments   Labs (all labs ordered are listed, but only abnormal results are displayed) Labs Reviewed  COMPREHENSIVE METABOLIC PANEL - Abnormal; Notable for the following components:      Result Value   Sodium 131 (*)    Glucose, Bld 161 (*)    Creatinine, Ser 1.38 (*)    Calcium 8.6 (*)    GFR, Estimated 51 (*)    All other components within normal limits  CBC WITH DIFFERENTIAL/PLATELET - Abnormal; Notable for the following components:   Lymphs Abs 0.4 (*)    All other components within normal limits  RESP PANEL BY RT-PCR (FLU A&B, COVID) [id number]    EKG EKG Interpretation  Date/Time:  Wednesday June 24 2021 15:39:20 EDT Ventricular Rate:  108 PR Interval:  232 QRS Duration: 88 QT Interval:  326 QTC Calculation: 436 R Axis:   72 Text  Interpretation: Sinus tachycardia with 1st degree A-V block with Premature atrial complexes Otherwise normal ECG Confirmed by Davonna Belling [phone]) on 06/24/2021 4:51:03 PM  Radiology DG Chest 2 View  Result Date: 06/24/2021 CLINICAL DATA:  Short of breath and cough EXAM: CHEST - 2 VIEW COMPARISON:  04/01/1999 twenty-two FINDINGS: Mild cardiac enlargement unchanged. Negative for heart failure. Lungs remain clear without infiltrate or effusion. ACDF lower cervical spine. IMPRESSION: No active cardiopulmonary disease. Electronically Signed   By: Franchot Gallo M.D.   On: 06/24/2021 16:49    Procedures Procedures   Medications Ordered in ED Medications  albuterol (VENTOLIN HFA) 108 (90 Base) MCG/ACT inhaler 2 puff (2 puffs Inhalation Given 06/24/21 1720)    ED Course  I have reviewed the triage vital signs and the nursing notes.  Pertinent labs & imaging results that were available during my care of the patient were reviewed by me and considered in my medical decision making (see chart for details).    MDM Rules/Calculators/A&P                          Patient with shortness of breath and cough.  Has had since Friday.  Started on Paxil bid by PCP and had been given steroids.  Not hypoxic here.  Work-up reassuring.  I think this is likely COVID infection since wife has a same positive test, however PCR test was negative.  Not hypoxic here.  Will discharge home with symptomatic treatment.  Follow-up instructions and return instructions given. Final Clinical Impression(s) / ED Diagnoses Final diagnoses:  Viral URI with cough    Rx / DC Orders ED Discharge Orders          Ordered    guaiFENesin-dextromethorphan (ROBITUSSIN DM) 100-10 MG/5ML syrup  3 times daily PRN        06/24/21 1940  Davonna Belling, MD 06/24/21 2312

## 2021-06-24 NOTE — ED Triage Notes (Signed)
Pt here for shortness of breath and cough. Pt's wife had covid last week, pt was never tested but PCP prescribed him prednisone and Paxlovid, pt starting taking it Sunday night, missed dose on Monday, resumed on Tuesday. Shob and cough has worsened, endorses nausea and some diarrhea, denies fevers

## 2021-06-29 ENCOUNTER — Ambulatory Visit (HOSPITAL_COMMUNITY)
Admission: EM | Admit: 2021-06-29 | Discharge: 2021-06-29 | Disposition: A | Discharge status: Home / Self Care | Payer: Medicare Other | Attending: Emergency Medicine | Admitting: Emergency Medicine

## 2021-06-29 ENCOUNTER — Encounter (HOSPITAL_COMMUNITY): Payer: Self-pay

## 2021-06-29 ENCOUNTER — Other Ambulatory Visit: Payer: Self-pay

## 2021-06-29 DIAGNOSIS — K219 Gastro-esophageal reflux disease without esophagitis: Secondary | ICD-10-CM

## 2021-06-29 DIAGNOSIS — R059 Cough, unspecified: Secondary | ICD-10-CM | POA: Diagnosis not present

## 2021-06-29 MED ORDER — SUCRALFATE 1 GM/10ML PO SUSP: 1.0000 g | Freq: Two times a day (BID) | ORAL | 0 refills | Status: DC

## 2021-06-29 NOTE — ED Triage Notes (Signed)
Pt c/o decrease in appetite, sore throat, and abd discomfort.  Started: about 7 days ago

## 2021-06-29 NOTE — Discharge Instructions (Addendum)
Take the carafate twice a day to help with acid reflux.    Make sure you are drinking plenty of fluids, especially water.   Do not lay flat immediately after eating.   Try to avoid foods that are high in acidity, spicy, or fried.    Follow up with your primary care provider as soon as possible for re-evaluation.

## 2021-06-29 NOTE — ED Provider Notes (Signed)
[facility]    CSN: [id number] Arrival date & time: 06/29/21  1113      History   Chief Complaint Chief Complaint  Patient presents with   Cough   Sore Throat    HPI Bob Elliott is a 80 y.o. male.   Patient here for evaluation of sore throat, abdominal pain, and decreased appetite that has been ongoing for the past several days.  Reports symptoms started after finishing prednisone and paxlovid.  Reports abdominal pain is a burning in the epigastric region.  Reports painful to swallow but managing secretions.  Denies any trauma, injury, or other precipitating event.  Denies any fevers, chest pain, shortness of breath, N/V/D, numbness, tingling, weakness, abdominal pain, or headaches.    The history is provided by the patient.  Cough Associated symptoms: sore throat   Sore Throat Associated symptoms include abdominal pain.   Past Medical History:  Diagnosis Date   Arthritis    BPH (benign prostatic hyperplasia)    CKD (chronic kidney disease), stage II    a. CKD II-III by labs.   Dizziness 10/21/2016   Esophageal dysmotility    Essential hypertension    Trial off acei 08/12/2017 for unexplained sob/ noct smothering    GERD (gastroesophageal reflux disease)    GI bleed    Glaucoma    BOTH EYES   Hyperlipidemia    Hypertension    Inguinal hernia without mention of obstruction or gangrene, unilateral or unspecified, (not specified as recurrent)-right s/p repair with mesh 05/21/2014   Schatzki's ring    Sinus bradycardia    a. prompting dc of Diltiazem (?chronotropic incompetence) in 2017.   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   Upper airway cough syndrome 04/17/2019   Thoracic aortic aneurysm ([place]) 07/21/2018   DOE (dyspnea on exertion) 10/21/2016    Dizziness 10/21/2016   Sinus bradycardia 10/21/2016   Sleep apnea    Schatzki's ring    Essential hypertension    Hyperlipidemia    Esophageal dysmotility    GERD (gastroesophageal reflux disease)    Inguinal hernia without mention of obstruction or gangrene, unilateral or unspecified, (not specified as recurrent)-right s/p repair with mesh 05/21/2014    Past Surgical History:  Procedure Laterality Date   COLONOSCOPY WITH PROPOFOL N/A 05/11/2016   Procedure: COLONOSCOPY WITH PROPOFOL;  Surgeon: Garlan Fair, MD;  [facility];  Service: Endoscopy;  Laterality: N/A;   EYE SURGERY     CATARACT R  EYE   HERNIA REPAIR     UMBILICAL   INGUINAL HERNIA REPAIR Right 06/18/2014   Procedure: RIGHT INGUINAL HERNIA REPAIR;  Surgeon: Odis Hollingshead, MD;  Location: [place];  Service: General;  Laterality: Right;   INSERTION OF MESH Right 06/18/2014   Procedure: INSERTION OF MESH;  Surgeon: Odis Hollingshead, MD;  Location: [place];  Service: General;  Laterality: Right;   NECK SURGERY  2000   cervical disc surgery(limited ROM)- retained hardware   TONSILLECTOMY         Home Medications    Prior to Admission medications   Medication Sig Start Date End Date Taking? Authorizing Provider  sucralfate (CARAFATE) 1 GM/10ML suspension Take 10 mLs (1 g total) by mouth 2 (two) times daily. 06/29/21  Yes Pearson Forster, NP  amLODipine (NORVASC) 10 MG tablet TAKE 1 TABLET DAILY 12/08/20   Dorothy Spark, MD  atorvastatin (LIPITOR) 10 MG tablet Take 1 tablet (10 mg total) by mouth 2 (two) times a week. Patient taking differently: Take 10 mg by mouth 2 (two) times a week. Takes on Tuesday and Saturday 05/04/21   Freada Bergeron, MD  clopidogrel (PLAVIX) 75 MG tablet TAKE 1 TABLET BY MOUTH EVERY DAY 03/04/21   Dorothy Spark, MD  doxazosin (CARDURA) 4 MG tablet Take 4 mg by mouth at bedtime.  02/12/14   [provider]  fluticasone (FLONASE) 50 MCG/ACT nasal spray Place 2 sprays  into both nostrils daily as needed for allergies or rhinitis.  03/28/14   [provider]  guaiFENesin-dextromethorphan (ROBITUSSIN DM) 100-10 MG/5ML syrup Take 5 mLs by mouth 3 (three) times daily as needed for cough. 06/24/21   Davonna Belling, MD  irbesartan (AVAPRO) 150 MG tablet TAKE 1 TABLET DAILY (DOSE DECREASE) 05/05/21   Freada Bergeron, MD  loratadine (CLARITIN) 10 MG tablet Take 10 mg by mouth daily.    [provider]  PAXLOVID 20 x 150 MG & 10 x '100MG'$  TBPK Take 3 tablets by mouth 2 (two) times daily. 06/21/21   [provider]  potassium chloride (KLOR-CON) 10 MEQ tablet Take 3 tablets (30 mEq total) by mouth 2 (two) times daily. 10/13/20   Dorothy Spark, MD  predniSONE (DELTASONE) 5 MG tablet Take 10-70 mg by mouth daily. Take 7 tab po on Monday after meal. Take 6 tab po on Tuesday after meal. Take 5 tab po on Wednesday after meal. Take 4 tab po on Thursday after meal. Take 3 tab po on Friday after meal. Take 2 tab po on Saturday after meal. Take 1 tab po on Sunday after meal. Started 06/22/21 End 06/28/21. 06/21/21   [provider]  solifenacin (VESICARE) 5 MG tablet Take 5 mg by mouth daily. 05/23/20   [provider]  spironolactone (ALDACTONE) 25 MG tablet TAKE ONE-HALF (1/2) TABLET DAILY 12/08/20   Dorothy Spark, MD  timolol (TIMOPTIC) 0.5 % ophthalmic solution Place 1 drop into both eyes daily.  11/25/17   [provider]    Family History Family History  Problem Relation Age of Onset   Heart disease Mother    Stroke Father    Cancer Brother        colon   Diabetes Sister    Leukemia Maternal Grandmother     Social History Social History   Tobacco Use   Smoking status: Never   Smokeless tobacco: Never  Vaping Use   Vaping Use: Never used  Substance Use Topics   Alcohol use: Yes    Comment: none since 80's   Drug use: No     Allergies   Aspirin, Codeine, and Shellfish allergy   Review of  Systems Review of Systems  HENT:  Positive for sore throat and trouble swallowing.   Respiratory:  Positive for cough.   Gastrointestinal:  Positive for abdominal pain.  All other systems reviewed and are negative.   Physical Exam Triage Vital Signs ED Triage Vitals [06/29/21 1226]  Enc Vitals Group     BP 100/60     Pulse Rate 87     Resp 18     Temp 98.4 F (36.9 C)     Temp Source Oral     SpO2 96 %     Weight  Height      Head Circumference      Peak Flow      Pain Score      Pain Loc      Pain Edu?      Excl. in [place]?    No data found.  Updated Vital Signs BP 100/60 (BP Location: Left Arm)   Pulse 87   Temp 98.4 F (36.9 C) (Oral)   Resp 18   SpO2 96%   Visual Acuity Right Eye Distance:   Left Eye Distance:   Bilateral Distance:    Right Eye Near:   Left Eye Near:    Bilateral Near:     Physical Exam Vitals and nursing note reviewed.  Constitutional:      General: He is not in acute distress.    Appearance: Normal appearance. He is not ill-appearing, toxic-appearing or diaphoretic.  HENT:     Head: Normocephalic and atraumatic.     Nose: No congestion.     Mouth/Throat:     Mouth: Mucous membranes are moist. No oral lesions.     Tongue: No lesions. Tongue does not deviate from midline.     Palate: No mass and lesions.     Pharynx: Oropharynx is clear. Uvula midline. No pharyngeal swelling, oropharyngeal exudate, posterior oropharyngeal erythema or uvula swelling.     Tonsils: No tonsillar exudate or tonsillar abscesses. 0 on the right. 0 on the left.  Eyes:     Conjunctiva/sclera: Conjunctivae normal.  Cardiovascular:     Rate and Rhythm: Normal rate and regular rhythm.     Pulses: Normal pulses.     Heart sounds: Normal heart sounds.  Pulmonary:     Effort: Pulmonary effort is normal.     Breath sounds: Normal breath sounds.  Abdominal:     General: Abdomen is flat.     Palpations: Abdomen is soft.     Tenderness: There is no abdominal  tenderness. There is no right CVA tenderness, left CVA tenderness, guarding or rebound. Negative signs include Murphy's sign, Rovsing's sign, McBurney's sign, psoas sign and obturator sign.     Comments: Reports burning in esophagus and epigastric region  Musculoskeletal:        General: Normal range of motion.     Cervical back: Normal range of motion.  Skin:    General: Skin is warm and dry.  Neurological:     General: No focal deficit present.     Mental Status: He is alert and oriented to person, place, and time.  Psychiatric:        Mood and Affect: Mood normal.     UC Treatments / Results  Labs (all labs ordered are listed, but only abnormal results are displayed) Labs Reviewed - No data to display  EKG   Radiology No results found.  Procedures Procedures (including critical care time)  Medications Ordered in UC Medications - No data to display  Initial Impression / Assessment and Plan / UC Course  I have reviewed the triage vital signs and the nursing notes.  Pertinent labs & imaging results that were available during my care of the patient were reviewed by me and considered in my medical decision making (see chart for details).    Assessment negative for red flags or concerns.  Likely cough vs post viral/covid vs GERD.  Will treat with carafate twice a day for the next few days.  Encouraged fluids and rest.  Discussed conservative symptom management.  Follow up with primary  care as soon as possible for re-evaluation.   Final Clinical Impressions(s) / UC Diagnoses   Final diagnoses:  Cough  Gastroesophageal reflux disease without esophagitis     Discharge Instructions      Take the carafate twice a day to help with acid reflux.    Make sure you are drinking plenty of fluids, especially water.   Do not lay flat immediately after eating.   Try to avoid foods that are high in acidity, spicy, or fried.    Follow up with your primary care provider as soon as  possible for re-evaluation.       ED Prescriptions     Medication Sig Dispense Auth. Provider   sucralfate (CARAFATE) 1 GM/10ML suspension Take 10 mLs (1 g total) by mouth 2 (two) times daily. 420 mL Pearson Forster, NP      PDMP not reviewed this encounter.   Pearson Forster, NP 06/29/21 [phone]

## 2021-07-06 ENCOUNTER — Telehealth: Payer: Self-pay | Admitting: Cardiology

## 2021-07-06 NOTE — Telephone Encounter (Signed)
Pt c/o BP issue: STAT if pt c/o blurred vision, one-sided weakness or slurred speech  1. What are your last 5 BP readings? Ranging 58-60 bottom, top 110-112, states his BP is usually around 130/70  2. Are you having any other symptoms (ex. Dizziness, headache, blurred vision, passed out)? Stomach upset, sometimes feels a little dizzy,   3. What is your BP issue? Patient states his BP has been low for a couple weeks since he had covid. He states one day he feels good then the next day he doesn't. He states he has also been having an upset stomach for 3-4 days. He says he is not sure if he's been eating enough.

## 2021-07-06 NOTE — Telephone Encounter (Signed)
Called patient back about his message. Patient complained about his BP running low. SBP 110 to 112. Patient took BP while on the phone 130/58 HR 80. Informed patient that is BP and HR are fine. Informed patient that his BP may drop due to his upset stomach and not eating a lot or drinking enough fluid. Encouraged patient to drink some fluid if he BP is too low. Encouraged patient to call his PCP about his stomach issues. Will send message to covering provider so they are aware.

## 2021-07-14 DIAGNOSIS — M25462 Effusion, left knee: Secondary | ICD-10-CM | POA: Diagnosis not present

## 2021-07-14 DIAGNOSIS — M25562 Pain in left knee: Secondary | ICD-10-CM | POA: Diagnosis not present

## 2021-07-22 DIAGNOSIS — E876 Hypokalemia: Secondary | ICD-10-CM | POA: Diagnosis not present

## 2021-07-22 DIAGNOSIS — I10 Essential (primary) hypertension: Secondary | ICD-10-CM | POA: Diagnosis not present

## 2021-08-12 DIAGNOSIS — M25561 Pain in right knee: Secondary | ICD-10-CM | POA: Diagnosis not present

## 2021-08-13 DIAGNOSIS — N183 Chronic kidney disease, stage 3 unspecified: Secondary | ICD-10-CM | POA: Diagnosis not present

## 2021-08-13 DIAGNOSIS — R7303 Prediabetes: Secondary | ICD-10-CM | POA: Diagnosis not present

## 2021-08-13 DIAGNOSIS — M1990 Unspecified osteoarthritis, unspecified site: Secondary | ICD-10-CM | POA: Diagnosis not present

## 2021-08-13 DIAGNOSIS — E876 Hypokalemia: Secondary | ICD-10-CM | POA: Diagnosis not present

## 2021-08-13 DIAGNOSIS — I712 Thoracic aortic aneurysm, without rupture: Secondary | ICD-10-CM | POA: Diagnosis not present

## 2021-08-13 DIAGNOSIS — J309 Allergic rhinitis, unspecified: Secondary | ICD-10-CM | POA: Diagnosis not present

## 2021-08-13 DIAGNOSIS — K224 Dyskinesia of esophagus: Secondary | ICD-10-CM | POA: Diagnosis not present

## 2021-08-13 DIAGNOSIS — K219 Gastro-esophageal reflux disease without esophagitis: Secondary | ICD-10-CM | POA: Diagnosis not present

## 2021-08-13 DIAGNOSIS — I10 Essential (primary) hypertension: Secondary | ICD-10-CM | POA: Diagnosis not present

## 2021-08-13 DIAGNOSIS — M109 Gout, unspecified: Secondary | ICD-10-CM | POA: Diagnosis not present

## 2021-08-13 DIAGNOSIS — Z23 Encounter for immunization: Secondary | ICD-10-CM | POA: Diagnosis not present

## 2021-08-13 DIAGNOSIS — I700 Atherosclerosis of aorta: Secondary | ICD-10-CM | POA: Diagnosis not present

## 2021-09-02 DIAGNOSIS — M25561 Pain in right knee: Secondary | ICD-10-CM | POA: Diagnosis not present

## 2021-09-16 DIAGNOSIS — M25561 Pain in right knee: Secondary | ICD-10-CM | POA: Diagnosis not present

## 2021-09-16 DIAGNOSIS — M25562 Pain in left knee: Secondary | ICD-10-CM | POA: Diagnosis not present

## 2021-10-06 NOTE — Progress Notes (Signed)
Cardiology Office Note:    Date:  10/16/2021   ID:  Bob Elliott, DOB [birth year], MRN [id number]  PCP:  [name], MD   [facility] Providers Cardiologist:  [name], MD (Inactive) {  Referring MD: [name], MD    History of Present Illness:    Bob Elliott is a 80 y.o. 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 Meda Coffee who now returns to clinic for CV follow-up.  Per review of the record, the patient has a history of amaurosis fugax and underwent cardiac monitor which showed SB-NSR and short runs NSVT 4 and 9 beats and no Afib. On 02/20/20, the patient was having DOE. Myoview normal LV function and no ischemia. Patient intolerant to ASA so started on Plavix b/c of recent TIA.  Last saw Dr. Meda Coffee on 10/28/20, he was doing well. Continued to have mild DOE. No HF symptoms. Has been following with Dr. Orvan Seen for ascending aortic aneurysm, which was stable at 50mm on last CTA in 11/2020. Myoview 02/26/20 with normal EF, no evidence of ischemia.   Last saw me in clinic on 04/16/21 where he was doing well from a CV standpoint.   Today, the patient feels overall okay from a CV perspective. Has been seeing an orthopedic surgeon for knee swelling and arthritis, which has been bothering him lately. Has decided to stop lipitor as he is afraid it made his joint pain worse. Amenable to starting zetia. No chest pain. Has continued shortness of breath with exertion. Has tried to increase his exercise capacity and rides the stationary bike about 90min a day. Occasionally dizzy with changing positions, but no syncope. Notably, his blood pressures have been on the lower side 100-110/50-60s.  Denies orthopnea, PND and LE edema but on exam has R>L LE edema and some soreness in the right calf which is new.   Past Medical History:  Diagnosis Date   Arthritis    BPH (benign  prostatic hyperplasia)    CKD (chronic kidney disease), stage II    a. CKD II-III by labs.   Dizziness 10/21/2016   Esophageal dysmotility    Essential hypertension    Trial off acei 08/12/2017 for unexplained sob/ noct smothering    GERD (gastroesophageal reflux disease)    GI bleed    Glaucoma    BOTH EYES   Hyperlipidemia    Hypertension    Inguinal hernia without mention of obstruction or gangrene, unilateral or unspecified, (not specified as recurrent)-right s/p repair with mesh 05/21/2014   Schatzki's ring    Sinus bradycardia    a. prompting dc of Diltiazem (?chronotropic incompetence) in 2017.   Sleep apnea    "mild" no cpap   Thoracic aortic aneurysm (TAA)     Past Surgical History:  Procedure Laterality Date   COLONOSCOPY WITH PROPOFOL N/A 05/11/2016   Procedure: COLONOSCOPY WITH PROPOFOL;  Surgeon: Garlan Fair, MD;  Location: [facility];  Service: Endoscopy;  Laterality: N/A;   EYE SURGERY     CATARACT R  EYE   HERNIA REPAIR     UMBILICAL   INGUINAL HERNIA REPAIR Right 06/18/2014   Procedure: RIGHT INGUINAL HERNIA REPAIR;  Surgeon: Odis Hollingshead, MD;  Location: [place];  Service: General;  Laterality: Right;   INSERTION OF MESH Right 06/18/2014   Procedure: INSERTION OF MESH;  Surgeon: Odis Hollingshead, MD;  Location: [facility]  OR;  Service: General;  Laterality: Right;   NECK SURGERY  2000   cervical disc surgery(limited ROM)- retained hardware   TONSILLECTOMY      Current Medications: Current Meds  Medication Sig   amLODipine (NORVASC) 5 MG tablet Take 1 tablet (5 mg total) by mouth daily.   clopidogrel (PLAVIX) 75 MG tablet TAKE 1 TABLET BY MOUTH EVERY DAY   doxazosin (CARDURA) 4 MG tablet Take 4 mg by mouth at bedtime.    ezetimibe (ZETIA) 10 MG tablet Take 1 tablet (10 mg total) by mouth daily.   fluticasone (FLONASE) 50 MCG/ACT nasal spray Place 2 sprays into both nostrils daily as needed for allergies or rhinitis.    guaiFENesin-dextromethorphan (ROBITUSSIN  DM) 100-10 MG/5ML syrup Take 5 mLs by mouth 3 (three) times daily as needed for cough.   irbesartan (AVAPRO) 150 MG tablet TAKE 1 TABLET DAILY (DOSE DECREASE)   loratadine (CLARITIN) 10 MG tablet Take 10 mg by mouth daily.   PAXLOVID 20 x 150 MG & 10 x 100MG  TBPK Take 3 tablets by mouth 2 (two) times daily.   potassium chloride (KLOR-CON) 10 MEQ tablet Take 3 tablets (30 mEq total) by mouth 2 (two) times daily.   predniSONE (DELTASONE) 5 MG tablet Take 10-70 mg by mouth daily. Take 7 tab po on Monday after meal. Take 6 tab po on Tuesday after meal. Take 5 tab po on Wednesday after meal. Take 4 tab po on Thursday after meal. Take 3 tab po on Friday after meal. Take 2 tab po on Saturday after meal. Take 1 tab po on Sunday after meal. Started 06/22/21 End 06/28/21.   solifenacin (VESICARE) 5 MG tablet Take 5 mg by mouth daily.   spironolactone (ALDACTONE) 25 MG tablet TAKE ONE-HALF (1/2) TABLET DAILY   sucralfate (CARAFATE) 1 GM/10ML suspension Take 10 mLs (1 g total) by mouth 2 (two) times daily.   timolol (TIMOPTIC) 0.5 % ophthalmic solution Place 1 drop into both eyes daily.    [DISCONTINUED] amLODipine (NORVASC) 10 MG tablet TAKE 1 TABLET DAILY     Allergies:   Aspirin, Codeine, Shellfish allergy, Amitriptyline, Gabapentin, Other, and Tramadol hcl   Social History   Socioeconomic History   Marital status: Married    Spouse name: Not on file   Number of children: 4   Years of education: Not on file   Highest education level: Not on file  Occupational History   Occupation: retired  Tobacco Use   Smoking status: Never   Smokeless tobacco: Never  Vaping Use   Vaping Use: Never used  Substance and Sexual Activity   Alcohol use: Yes    Comment: none since 80's   Drug use: No   Sexual activity: Not Currently  Other Topics Concern   Not on file  Social History Narrative   Not on file   Social Determinants of Health   Financial Resource Strain: Not on file  Food Insecurity: Not on  file  Transportation Needs: Not on file  Physical Activity: Not on file  Stress: Not on file  Social Connections: Not on file     Family History: The patient's family history includes Cancer in his brother; Diabetes in his sister; Heart disease in his mother; Leukemia in his maternal grandmother; Stroke in his father.  ROS:   Please see the history of present illness.    Review of Systems  Constitutional:  Negative for chills and fever.  HENT:  Negative for hearing loss.   Eyes:  Negative for blurred vision.  Respiratory:  Positive for shortness of breath.   Cardiovascular:  Positive for leg swelling. Negative for chest pain, palpitations, orthopnea, claudication and PND.  Gastrointestinal:  Negative for melena.  Genitourinary:  Negative for dysuria and hematuria.  Musculoskeletal:  Positive for back pain, joint pain and myalgias.  Neurological:  Positive for dizziness. Negative for loss of consciousness.  Psychiatric/Behavioral:  Negative for substance abuse.    EKGs/Labs/Other Studies Reviewed:    The following studies were reviewed today: Myoview 02/26/20: Nuclear stress EF: 58%. The left ventricular ejection fraction is normal (55-65%). There was no ST segment deviation noted during stress. This is a low risk study.   Apical thinning not thought to be significant No ischemia EF 58%  Cardiac Monitor 11/2019:  Sinus bradycardia to sinus rhythm. Two very short runs of nsVT - 4 and 9 beats.   Sinus bradycardia to sinus rhythm. Two very short runs of nsVT - 4 and 9 beats. No atrial fibrillation.  TTE 09/2019: IMPRESSIONS   1. Left ventricular ejection fraction, by visual estimation, is 60 to  65%. The left ventricle has normal function. There is mildly increased  left ventricular hypertrophy.   2. Left ventricular diastolic parameters are consistent with Grade I  diastolic dysfunction (impaired relaxation).   3. The left ventricle has no regional wall motion  abnormalities.   4. Global right ventricle has normal systolic function.The right  ventricular size is normal. No increase in right ventricular wall  thickness.   5. Left atrial size was normal.   6. Right atrial size was normal.   7. The mitral valve is normal in structure. No evidence of mitral valve  regurgitation. No evidence of mitral stenosis.   8. The tricuspid valve is normal in structure. Tricuspid valve  regurgitation is not demonstrated.   9. The aortic valve is tricuspid. Aortic valve regurgitation is mild. No  evidence of aortic valve sclerosis or stenosis.  10. There is mild dilatation of the aortic root measuring 41 mm.  11. TR signal is inadequate for assessing pulmonary artery systolic  pressure.  12. The inferior vena cava is normal in size with greater than 50%  respiratory variability, suggesting right atrial pressure of 3 mmHg.    Carotid US 09/20/2019 Summary: Right Carotid: Velocities in the right ICA are consistent with a 1-39% stenosis. Left Carotid: Velocities in the left ICA are consistent with a 1-39% stenosis. Vertebrals: Bilateral vertebral arteries demonstrate antegrade flow.   Chest CTA 03/26/2019 IMPRESSION: 1. No significant change in caliber of the thoracic aorta, measuring approximately 4.1 x 4.0 cm in the tubular ascending thoracic aorta, previously 4.3 x 4.2 cm when measured similarly. The aortic root is normal in caliber. The sinuses of Valsalva are enlarged up to 4.3 cm. The distal aortic arch and descending thoracic aorta are normal in caliber. Minimal atherosclerosis.   Chest CTA 05/2020 Interval increase in size of known thoracic aortic aneurysm now measuring up to 4.6 cm (previously measured 4.0 x 4.0 cm). Greater than 5 mm growth over the past 12 months is associated with an increased risk of aneurysm rupture. 2. Diffuse bilateral bronchial wall thickening, consistent with nonspecific infectious or inflammatory bronchitis.   Myoview  09/06/16 Nuclear stress EF: 53%. Blood pressure demonstrated a normal response to exercise. There was no ST segment deviation noted during stress. This is a low risk study. The left ventricular ejection fraction is mildly decreased (45-54%). Inferior and apical thinning consistent with soft tissue attenuaion (  diaphragm), cannot exclude small subendocardial scar No ichemia   Echocardiogram 09/06/16 Mild septal hypertrophy with mod post wall hypertrophy, EF 55-60, no RWMA, Gr 1 DD, mild AI, trivial MR, mild RAE, GLS -17.2%   EKG:  No new ECG today  Recent Labs: 06/24/2021: ALT 19; BUN 16; Creatinine, Ser 1.38; Hemoglobin 14.3; Platelets 204; Potassium 4.4; Sodium 131  Recent Lipid Panel    Component Value Date/Time   CHOL 96 (L) 02/20/2020 0937   TRIG 75 02/20/2020 0937   HDL 46 02/20/2020 0937   CHOLHDL 2.1 02/20/2020 0937   CHOLHDL 4.0 09/20/2019 0740   VLDL 19 09/20/2019 0740   LDLCALC 34 02/20/2020 0937      Physical Exam:    VS:  BP (!) 110/50   Pulse 70   Ht 5\' 8"  (1.727 m)   Wt 210 lb 12.8 oz (95.6 kg)   SpO2 96%   BMI 32.05 kg/m     Wt Readings from Last 3 Encounters:  10/16/21 210 lb 12.8 oz (95.6 kg)  06/24/21 205 lb (93 kg)  04/16/21 210 lb 12.8 oz (95.6 kg)     GEN:  Comfortable, ambulates with walker HEENT: Normal NECK: No JVD; No carotid bruits CARDIAC: RRR, no murmurs, rubs, gallops RESPIRATORY:  Clear to auscultation without rales, wheezing or rhonchi  ABDOMEN: Soft, non-tender, non-distended MUSCULOSKELETAL:  1+ asymmetric LE edema in right leg to the mid-shin. LLE without edema. SKIN: Warm and dry NEUROLOGIC:  Alert and oriented x 3 PSYCHIATRIC:  Normal affect   ASSESSMENT:    1. Pain of right lower leg   2. Hyperlipidemia, unspecified hyperlipidemia type   3. Essential hypertension   4. Medication management   5. Aneurysm of ascending aorta without rupture   6. Amaurosis fugax   7. Chronic dyspnea   8. Edema of right lower extremity     PLAN:    In order of problems listed above:  #RLE Edema: #RLE Calf Pain: Patient noted to have R>L LE edema and calf pain on exam today. Has had difficulty ambulating due to joint pain. Will check LE dopplers to ensure no DVT. -Check RLE venous doppler  #DOE: Stable. Reassuring cardiac work-up. Myoview negative for ischemia or infarction; normal LVEF. Suspect secondary to deconditioning given reassuring CV workup. -Negative ischemic work-up -Discussed importance of continued exercise to help with conditioning with goal 131min moderate intensity exercise per week  #Thoracic Aortic Aneurysm: Measured 4.1cm on 03/2019, 4.6cm on 05/2020, and 4.3cm on 11/2020. Follows with Dr. Orvan Seen. -Continue irbesartan 150mg  daily -Continue lipitor 10mg  2x/week -Unable to tolerate ASA; on plavix -Follow-up with Dr. Orvan Seen as scheduled  #HTN: Running low at home with some dizziness.  -Decrease amlodipine to 5mg  daily -Continue irbesartan 150mg  daily -Continue spironolactone 12.5mg  daily  #HLD: LDL 81 03/2021. Stopped statin due to joint pain and does not want to re-trial with different agent. Ideally, goal would be <70 given history of amaurosis fugax. -Start zetia 10mg  daily -Off statin due to joint pain -Continue lifestyle modifications with diet and increased exercise  #Amaurosis Fugax: 30day monitor without Afib. No recurrence of Afib -On plavix 75mg  daily -Start zetia 10mg  daily as above; did not tolerate statins due to joint pain--does not want to retrial -Intolerant of ASA  Medication Adjustments/Labs and Tests Ordered: Current medicines are reviewed at length with the patient today.  Concerns regarding medicines are outlined above.  Orders Placed This Encounter  Procedures   Lipid Profile   VAS [place] LOWER EXTREMITY VENOUS (DVT)  Meds ordered this encounter  Medications   amLODipine (NORVASC) 5 MG tablet    Sig: Take 1 tablet (5 mg total) by mouth daily.    Dispense:  90 tablet     Refill:  2    Dose decrease   ezetimibe (ZETIA) 10 MG tablet    Sig: Take 1 tablet (10 mg total) by mouth daily.    Dispense:  90 tablet    Refill:  2    Patient Instructions  Medication Instructions:   DECREASE YOUR AMLODIPINE TO 5 MG BY MOUTH DAILY  START TAKING ZETIA 10 MG BY MOUTH DAILY  *If you need a refill on your cardiac medications before your next appointment, please call your pharmacy*   Lab Work:  IN 6 MONTHS HERE IN THE OFFICE, SAME DAY AS YOUR 6 MONTH FOLLOW-UP APPOINTMENT WITH DR. Sherril Croon CHECK LIPIDS AT THAT TIME--PLEASE COME FASTING TO THIS LAB APPOINTMENT  If you have labs (blood work) drawn today and your tests are completely normal, you will receive your results only by: [place] (if you have MyChart) OR A paper copy in the mail If you have any lab test that is abnormal or we need to change your treatment, we will call you to review the results.   Testing/Procedures:  Your physician has requested that you have a lower extremity venous duplex. This test is an ultrasound of the veins in the legs or arms. It looks at venous blood flow that carries blood from the heart to the legs or arms. Allow one hour for a Lower Venous exam. Allow thirty minutes for an Upper Venous exam. There are no restrictions or special instructions.   Follow-Up: At [facility], you and your health needs are our priority.  As part of our continuing mission to provide you with exceptional heart care, we have created designated Provider Care Teams.  These Care Teams include your primary Cardiologist (physician) and Advanced Practice Providers (APPs -  Physician Assistants and Nurse Practitioners) who all work together to provide you with the care you need, when you need it.  We recommend signing up for the patient portal called "MyChart".  Sign up information is provided on this After Visit Summary.  MyChart is used to connect with patients for Virtual Visits  (Telemedicine).  Patients are able to view lab/test results, encounter notes, upcoming appointments, etc.  Non-urgent messages can be sent to your provider as well.   To learn more about what you can do with MyChart, go to [email or web address].    Your next appointment:   6 month(s)  The format for your next appointment:   In Person  Provider:    DR. Isaiah Blakes HAVE LIPIDS CHECKED AT THIS VISIT    Signed, Freada Bergeron, MD  10/16/2021 10:27 AM    [place]

## 2021-10-16 ENCOUNTER — Encounter: Payer: Self-pay | Admitting: Cardiology

## 2021-10-16 ENCOUNTER — Ambulatory Visit (INDEPENDENT_AMBULATORY_CARE_PROVIDER_SITE_OTHER): Payer: Medicare Other | Admitting: Cardiology

## 2021-10-16 ENCOUNTER — Other Ambulatory Visit: Payer: Self-pay

## 2021-10-16 VITALS — BP 110/50 | HR 70 | Ht 68.0 in | Wt 210.8 lb

## 2021-10-16 DIAGNOSIS — M79661 Pain in right lower leg: Secondary | ICD-10-CM | POA: Diagnosis not present

## 2021-10-16 DIAGNOSIS — R600 Localized edema: Secondary | ICD-10-CM

## 2021-10-16 DIAGNOSIS — G453 Amaurosis fugax: Secondary | ICD-10-CM

## 2021-10-16 DIAGNOSIS — I7121 Aneurysm of the ascending aorta, without rupture: Secondary | ICD-10-CM

## 2021-10-16 DIAGNOSIS — I10 Essential (primary) hypertension: Secondary | ICD-10-CM | POA: Diagnosis not present

## 2021-10-16 DIAGNOSIS — Z79899 Other long term (current) drug therapy: Secondary | ICD-10-CM

## 2021-10-16 DIAGNOSIS — E785 Hyperlipidemia, unspecified: Secondary | ICD-10-CM

## 2021-10-16 DIAGNOSIS — R0609 Other forms of dyspnea: Secondary | ICD-10-CM

## 2021-10-16 MED ORDER — EZETIMIBE 10 MG PO TABS: 10.0000 mg | ORAL_TABLET | Freq: Every day | ORAL | 2 refills | Status: DC

## 2021-10-16 MED ORDER — AMLODIPINE BESYLATE 5 MG PO TABS: 5.0000 mg | ORAL_TABLET | Freq: Every day | ORAL | 2 refills | Status: DC

## 2021-10-16 NOTE — Patient Instructions (Signed)
Medication Instructions:   DECREASE YOUR AMLODIPINE TO 5 MG BY MOUTH DAILY  START TAKING ZETIA 10 MG BY MOUTH DAILY  *If you need a refill on your cardiac medications before your next appointment, please call your pharmacy*   Lab Work:  IN 6 MONTHS HERE IN THE OFFICE, SAME DAY AS YOUR 6 MONTH FOLLOW-UP APPOINTMENT WITH DR. Sherril Croon CHECK LIPIDS AT THAT TIME--PLEASE COME FASTING TO THIS LAB APPOINTMENT  If you have labs (blood work) drawn today and your tests are completely normal, you will receive your results only by: [place] (if you have MyChart) OR A paper copy in the mail If you have any lab test that is abnormal or we need to change your treatment, we will call you to review the results.   Testing/Procedures:  Your physician has requested that you have a lower extremity venous duplex. This test is an ultrasound of the veins in the legs or arms. It looks at venous blood flow that carries blood from the heart to the legs or arms. Allow one hour for a Lower Venous exam. Allow thirty minutes for an Upper Venous exam. There are no restrictions or special instructions.   Follow-Up: At [facility], you and your health needs are our priority.  As part of our continuing mission to provide you with exceptional heart care, we have created designated Provider Care Teams.  These Care Teams include your primary Cardiologist (physician) and Advanced Practice Providers (APPs -  Physician Assistants and Nurse Practitioners) who all work together to provide you with the care you need, when you need it.  We recommend signing up for the patient portal called "MyChart".  Sign up information is provided on this After Visit Summary.  MyChart is used to connect with patients for Virtual Visits (Telemedicine).  Patients are able to view lab/test results, encounter notes, upcoming appointments, etc.  Non-urgent messages can be sent to your provider as well.   To learn more about what you can  do with MyChart, go to [email or web address].    Your next appointment:   6 month(s)  The format for your next appointment:   In Person  Provider:    DR. Isaiah Blakes HAVE LIPIDS CHECKED AT THIS VISIT

## 2021-10-19 ENCOUNTER — Other Ambulatory Visit: Payer: Self-pay

## 2021-10-19 ENCOUNTER — Ambulatory Visit (HOSPITAL_COMMUNITY)
Admission: RE | Admit: 2021-10-19 | Discharge: 2021-10-19 | Disposition: A | Discharge status: Home / Self Care | Payer: Medicare Other | Source: Ambulatory Visit | Attending: Cardiology | Admitting: Cardiology

## 2021-10-19 DIAGNOSIS — M79661 Pain in right lower leg: Secondary | ICD-10-CM | POA: Insufficient documentation

## 2021-10-21 DIAGNOSIS — Z23 Encounter for immunization: Secondary | ICD-10-CM | POA: Diagnosis not present

## 2021-11-04 DIAGNOSIS — M1711 Unilateral primary osteoarthritis, right knee: Secondary | ICD-10-CM | POA: Diagnosis not present

## 2021-11-11 ENCOUNTER — Other Ambulatory Visit: Payer: Self-pay

## 2021-11-11 DIAGNOSIS — Z79899 Other long term (current) drug therapy: Secondary | ICD-10-CM

## 2021-11-11 DIAGNOSIS — I10 Essential (primary) hypertension: Secondary | ICD-10-CM

## 2021-11-11 DIAGNOSIS — E785 Hyperlipidemia, unspecified: Secondary | ICD-10-CM

## 2021-11-11 MED ORDER — SPIRONOLACTONE 25 MG PO TABS: 12.5000 mg | ORAL_TABLET | Freq: Every day | ORAL | 3 refills | Status: DC

## 2021-11-11 MED ORDER — AMLODIPINE BESYLATE 5 MG PO TABS: 5.0000 mg | ORAL_TABLET | Freq: Every day | ORAL | 3 refills | Status: DC

## 2021-11-24 DIAGNOSIS — K219 Gastro-esophageal reflux disease without esophagitis: Secondary | ICD-10-CM | POA: Diagnosis not present

## 2021-11-24 DIAGNOSIS — I10 Essential (primary) hypertension: Secondary | ICD-10-CM | POA: Diagnosis not present

## 2021-11-24 DIAGNOSIS — E782 Mixed hyperlipidemia: Secondary | ICD-10-CM | POA: Diagnosis not present

## 2021-11-24 DIAGNOSIS — M47812 Spondylosis without myelopathy or radiculopathy, cervical region: Secondary | ICD-10-CM | POA: Diagnosis not present

## 2021-11-24 DIAGNOSIS — M1990 Unspecified osteoarthritis, unspecified site: Secondary | ICD-10-CM | POA: Diagnosis not present

## 2021-11-24 DIAGNOSIS — N400 Enlarged prostate without lower urinary tract symptoms: Secondary | ICD-10-CM | POA: Diagnosis not present

## 2021-11-24 DIAGNOSIS — N182 Chronic kidney disease, stage 2 (mild): Secondary | ICD-10-CM | POA: Diagnosis not present

## 2021-11-24 DIAGNOSIS — H409 Unspecified glaucoma: Secondary | ICD-10-CM | POA: Diagnosis not present

## 2021-11-25 ENCOUNTER — Telehealth: Payer: Self-pay | Admitting: Cardiology

## 2021-11-25 NOTE — Telephone Encounter (Signed)
Sharyn Lull, pts Nurse Case Manager is calling the office to report to Dr. Johney Frame that the pt stopped taking zetia (did not notify our office about this) due to side effects. Pt reported to the Nurse Case Manager that zetia caused muscle and joint pain.  Sharyn Lull wanted to run this by Dr. Johney Frame, incase she wanted to switch the pt to an alternative regimen.  Will send this message to Dr. Johney Frame to further review and advise on, and follow-up with the pt accordingly thereafter.

## 2021-11-25 NOTE — Telephone Encounter (Signed)
Pt c/o medication issue:  1. Name of Medication: ezetimibe (ZETIA) 10 MG tablet  2. How are you currently taking this medication (dosage and times per day)? No longer taking  3. Are you having a reaction (difficulty breathing--STAT)? no  4. What is your medication issue? Sharyn Lull, nurse case manager, states the  patient stopped taking without notifying the office due to side effects. She says he told her it made his bones achy and overall felt sick. She says the call back can go to the patient if there are any alternative medication options for him.

## 2021-11-25 NOTE — Telephone Encounter (Signed)
I see that pt has tried atorvastatin 10, 20, and 40mg  daily, as well as 10mg  twice weekly before he was started on ezetimibe.  I see in prior note that pt did not want to try another statin, but his insurance won't cover bempedoic acid or PCSK9i unless he has rechallenged with another statin first. Would see if he is willing to try rosuvastatin 5mg  daily, typically tolerated better than the atorvastatin he previously took. If he does not tolerate that, then can discuss bempedoic acid or PCSK9i.

## 2021-11-26 MED ORDER — RED YEAST RICE 600 MG PO TABS: 600.0000 mg | ORAL_TABLET | Freq: Every day | ORAL | 3 refills | Status: DC

## 2021-11-26 NOTE — Telephone Encounter (Signed)
He probably will not want to try another statin. Can we trial red yeast rice? He will likely be a bit more amenable for this.

## 2021-11-26 NOTE — Telephone Encounter (Signed)
Spoke with the pt and informed him that per Dr. Johney Frame, we will stop his zetia and start him on Red Yeast Rice 600 mg po daily. Discontinued zetia from his med list and updated in allergies as an intolerance.  Confirmed the pharmacy of choice with the pt.  Pt verbalized understanding and agrees with this plan.

## 2021-11-27 ENCOUNTER — Other Ambulatory Visit: Payer: Self-pay | Admitting: Cardiothoracic Surgery

## 2021-11-27 DIAGNOSIS — I7121 Aneurysm of the ascending aorta, without rupture: Secondary | ICD-10-CM

## 2021-11-27 NOTE — Progress Notes (Unsigned)
T chest

## 2021-12-14 DIAGNOSIS — I712 Thoracic aortic aneurysm, without rupture, unspecified: Secondary | ICD-10-CM | POA: Diagnosis not present

## 2021-12-14 DIAGNOSIS — M1990 Unspecified osteoarthritis, unspecified site: Secondary | ICD-10-CM | POA: Diagnosis not present

## 2021-12-14 DIAGNOSIS — G720 Drug-induced myopathy: Secondary | ICD-10-CM | POA: Diagnosis not present

## 2021-12-14 DIAGNOSIS — R7303 Prediabetes: Secondary | ICD-10-CM | POA: Diagnosis not present

## 2021-12-14 DIAGNOSIS — I10 Essential (primary) hypertension: Secondary | ICD-10-CM | POA: Diagnosis not present

## 2021-12-14 DIAGNOSIS — M109 Gout, unspecified: Secondary | ICD-10-CM | POA: Diagnosis not present

## 2021-12-14 DIAGNOSIS — E876 Hypokalemia: Secondary | ICD-10-CM | POA: Diagnosis not present

## 2021-12-14 DIAGNOSIS — D72819 Decreased white blood cell count, unspecified: Secondary | ICD-10-CM | POA: Diagnosis not present

## 2021-12-14 DIAGNOSIS — I700 Atherosclerosis of aorta: Secondary | ICD-10-CM | POA: Diagnosis not present

## 2021-12-14 DIAGNOSIS — M47812 Spondylosis without myelopathy or radiculopathy, cervical region: Secondary | ICD-10-CM | POA: Diagnosis not present

## 2021-12-14 DIAGNOSIS — N183 Chronic kidney disease, stage 3 unspecified: Secondary | ICD-10-CM | POA: Diagnosis not present

## 2021-12-14 DIAGNOSIS — E782 Mixed hyperlipidemia: Secondary | ICD-10-CM | POA: Diagnosis not present

## 2021-12-22 DIAGNOSIS — H52203 Unspecified astigmatism, bilateral: Secondary | ICD-10-CM | POA: Diagnosis not present

## 2021-12-22 DIAGNOSIS — H25812 Combined forms of age-related cataract, left eye: Secondary | ICD-10-CM | POA: Diagnosis not present

## 2021-12-22 DIAGNOSIS — H401131 Primary open-angle glaucoma, bilateral, mild stage: Secondary | ICD-10-CM | POA: Diagnosis not present

## 2021-12-31 ENCOUNTER — Ambulatory Visit (INDEPENDENT_AMBULATORY_CARE_PROVIDER_SITE_OTHER): Payer: Medicare Other | Admitting: Podiatry

## 2021-12-31 ENCOUNTER — Other Ambulatory Visit: Payer: Self-pay

## 2021-12-31 DIAGNOSIS — L02612 Cutaneous abscess of left foot: Secondary | ICD-10-CM | POA: Diagnosis not present

## 2021-12-31 DIAGNOSIS — L03032 Cellulitis of left toe: Secondary | ICD-10-CM | POA: Diagnosis not present

## 2021-12-31 DIAGNOSIS — L600 Ingrowing nail: Secondary | ICD-10-CM

## 2021-12-31 MED ORDER — DOXYCYCLINE HYCLATE 100 MG PO TABS: 100.0000 mg | ORAL_TABLET | Freq: Two times a day (BID) | ORAL | 0 refills | Status: DC

## 2021-12-31 MED ORDER — MUPIROCIN 2 % EX OINT: 1.0000 "application " | TOPICAL_OINTMENT | Freq: Two times a day (BID) | CUTANEOUS | 0 refills | Status: DC

## 2022-01-03 NOTE — Progress Notes (Signed)
Subjective: 81 year old male presents the office today for concerns of possible ingrown toenail left big toe, lateral aspect.  He states he has tried trimming the toenails himself and the nail did split off the corner.  After that he noticed some redness and clear bumps present.  He has been applying antibiotic ointment.  He is also tried soaking his foot  This started last week.  No other concerns.  Objective: AAO x3, NAD DP/PT pulses palpable bilaterally, CRT less than 3 seconds Incurvation present left lateral hallux nail border Moderate clear drainage expressed from the nail border but there is no purulence.  There is localized edema and erythema to the lateral aspect the toe.  There is no fluctuation or crepitation. No pain with calf compression, swelling, warmth, erythema     Assessment: Left lateral hallux ingrown toenail with infection  Plan: -All treatment options discussed with the patient including all alternatives, risks, complications.  -At this time, recommended partial nail removal without chemical matricectomy to the left lateral due to infection. Risks and complications were discussed with the patient for which they understand and  verbally consent to the procedure. Under sterile conditions a total of 3 mL of a mixture of 2% lidocaine plain and 0.5% Marcaine plain was infiltrated in a hallux block fashion. Once anesthetized, the skin was prepped in sterile fashion. A tourniquet was then applied. Next the lateral border of the hallux nail border was sharply excised making sure to remove the entire offending nail border. Once the nail was  Removed, the area was debrided and the underlying skin was intact. The area was irrigated and hemostasis was obtained.  A dry sterile dressing was applied. After application of the dressing the tourniquet was removed and there is found to be an immediate capillary refill time to the digit. The patient tolerated the procedure well any complications.  Post procedure instructions were discussed the patient for which he verbally understood. Follow-up in one week for nail check or sooner if any problems are to arise. Discussed signs/symptoms of worsening infection and directed to call the office immediately should any occur or go directly to the emergency room. In the meantime, encouraged to call the office with any questions, concerns, changes symptoms. -Doxycycline -Mupirocin ointment ordered. -Patient encouraged to call the office with any questions, concerns, change in symptoms.   Trula Slade DPM

## 2022-01-04 ENCOUNTER — Other Ambulatory Visit: Payer: Self-pay

## 2022-01-04 ENCOUNTER — Encounter: Payer: Self-pay | Admitting: Cardiothoracic Surgery

## 2022-01-04 ENCOUNTER — Ambulatory Visit
Admission: RE | Admit: 2022-01-04 | Discharge: 2022-01-04 | Disposition: A | Discharge status: Home / Self Care | Payer: Medicare Other | Source: Ambulatory Visit | Attending: Cardiothoracic Surgery | Admitting: Cardiothoracic Surgery

## 2022-01-04 ENCOUNTER — Ambulatory Visit: Payer: Medicare Other | Admitting: Cardiothoracic Surgery

## 2022-01-04 ENCOUNTER — Ambulatory Visit (INDEPENDENT_AMBULATORY_CARE_PROVIDER_SITE_OTHER): Payer: Medicare Other | Admitting: Cardiothoracic Surgery

## 2022-01-04 VITALS — BP 154/74 | HR 77 | Resp 20 | Ht 68.0 in | Wt 210.0 lb

## 2022-01-04 DIAGNOSIS — I7121 Aneurysm of the ascending aorta, without rupture: Secondary | ICD-10-CM

## 2022-01-04 DIAGNOSIS — I712 Thoracic aortic aneurysm, without rupture, unspecified: Secondary | ICD-10-CM | POA: Diagnosis not present

## 2022-01-04 NOTE — Progress Notes (Signed)
PCP is [name], MD Referring Provider is [name], MD  Chief Complaint  Patient presents with   Thoracic Aortic Aneurysm    Yearly f/u with CT chest today    HPI: Patient presents for 1 year follow-up with surveillance CTA for his asymptomatic mild left moderate fusiform ascending.  The aortic diameter has been stable since 2019.  He has hypertension, compliant with his medication.  He is followed by cardiology.  He has had a recent stress test which was low risk for ischemia.  He has a tricuspid aortic valve without insufficiency or stenosis by echo 2020.  Patient remains asymptomatic from cardiac symptoms.  He has been trying to eat more healthy food along with his wife.  I reviewed the images of his CT scan today without contrast which shows the aortic diameter remains at 4.5 cm stable.  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 CKD II-III by labs.   Dizziness 10/21/2016   Esophageal dysmotility    Essential hypertension    Trial off acei 08/12/2017 for unexplained sob/ noct smothering    GERD (gastroesophageal reflux disease)    GI bleed    Glaucoma    BOTH EYES   Hyperlipidemia    Hypertension    Inguinal hernia without mention of obstruction or gangrene, unilateral or unspecified, (not specified as recurrent)-right s/p repair with mesh 05/21/2014   Schatzki's ring    Sinus bradycardia    a. prompting dc of Diltiazem (?chronotropic incompetence) in 2017.   Sleep apnea    "mild" no cpap   Thoracic aortic aneurysm (TAA)     Past Surgical History:  Procedure Laterality Date   COLONOSCOPY WITH PROPOFOL N/A 05/11/2016   Procedure: COLONOSCOPY WITH PROPOFOL;  Surgeon: Garlan Fair, MD;  Location: [facility];  Service: Endoscopy;  Laterality: N/A;   EYE SURGERY     CATARACT R  EYE   HERNIA  REPAIR     UMBILICAL   INGUINAL HERNIA REPAIR Right 06/18/2014   Procedure: RIGHT INGUINAL HERNIA REPAIR;  Surgeon: Odis Hollingshead, MD;  Location: [place];  Service: General;  Laterality: Right;   INSERTION OF MESH Right 06/18/2014   Procedure: INSERTION OF MESH;  Surgeon: Odis Hollingshead, MD;  Location: [place];  Service: General;  Laterality: Right;   NECK SURGERY  2000   cervical disc surgery(limited ROM)- retained hardware   TONSILLECTOMY      Family History  Problem Relation Age of Onset   Heart disease Mother    Stroke Father    Cancer Brother        colon   Diabetes Sister    Leukemia Maternal Grandmother     Social History Social History   Tobacco Use   Smoking status: Never   Smokeless tobacco: Never  Vaping Use   Vaping Use: Never used  Substance Use Topics   Alcohol use: Yes    Comment: none since 80's   Drug use: No    Current Outpatient Medications  Medication Sig Dispense Refill   amLODipine (NORVASC) 5 MG tablet Take 1 tablet (5 mg total) by mouth daily. 90 tablet 3   clopidogrel (PLAVIX) 75 MG tablet TAKE 1 TABLET BY MOUTH EVERY DAY 90 tablet 2   doxazosin (CARDURA) 4 MG tablet Take  4 mg by mouth at bedtime.      doxycycline (VIBRA-TABS) 100 MG tablet Take 1 tablet (100 mg total) by mouth 2 (two) times daily. 20 tablet 0   fluticasone (FLONASE) 50 MCG/ACT nasal spray Place 2 sprays into both nostrils daily as needed for allergies or rhinitis.      guaiFENesin-dextromethorphan (ROBITUSSIN DM) 100-10 MG/5ML syrup Take 5 mLs by mouth 3 (three) times daily as needed for cough. 118 mL 0   irbesartan (AVAPRO) 150 MG tablet TAKE 1 TABLET DAILY (DOSE DECREASE) 15 tablet 0   loratadine (CLARITIN) 10 MG tablet Take 10 mg by mouth daily.     mupirocin ointment (BACTROBAN) 2 % Apply 1 application topically 2 (two) times daily. 30 g 0   PAXLOVID 20 x 150 MG & 10 x 100MG  TBPK Take 3 tablets by mouth 2 (two) times daily.     potassium chloride (KLOR-CON) 10 MEQ tablet  Take 3 tablets (30 mEq total) by mouth 2 (two) times daily. 540 tablet 1   predniSONE (DELTASONE) 5 MG tablet Take 10-70 mg by mouth daily. Take 7 tab po on Monday after meal. Take 6 tab po on Tuesday after meal. Take 5 tab po on Wednesday after meal. Take 4 tab po on Thursday after meal. Take 3 tab po on Friday after meal. Take 2 tab po on Saturday after meal. Take 1 tab po on Sunday after meal. Started 06/22/21 End 06/28/21.     Red Yeast Rice 600 MG TABS Take 1 tablet (600 mg total) by mouth daily. 90 tablet 3   solifenacin (VESICARE) 5 MG tablet Take 5 mg by mouth daily.     spironolactone (ALDACTONE) 25 MG tablet Take 0.5 tablets (12.5 mg total) by mouth daily. 45 tablet 3   sucralfate (CARAFATE) 1 GM/10ML suspension Take 10 mLs (1 g total) by mouth 2 (two) times daily. 420 mL 0   timolol (TIMOPTIC) 0.5 % ophthalmic solution Place 1 drop into both eyes daily.   4   No current facility-administered medications for this visit.    Allergies  Allergen Reactions   Aspirin Hives, Itching and Nausea Only    Other reaction(s): GI Upset   Codeine Hives, Itching and Nausea Only    Dizzy spells   Shellfish Allergy Hives   Amitriptyline     Other reaction(s): Intolerant   Gabapentin     Other reaction(s): Intolerant   Other     Other reaction(s): stomach upset   Tramadol Hcl     Other reaction(s): grogginess   Zetia [Ezetimibe] Other (See Comments)    Pt reports causes joint and muscle aches    Review of Systems Weight stable at 210 Dyspnea with exertion but no ankle edema  BP (!) 154/74 (BP Location: Right Arm, Patient Position: Sitting)    Pulse 77    Resp 20    Ht 5\' 8"  (1.727 m)    Wt 210 lb (95.3 kg)    SpO2 93% Comment: RA   BMI 31.93 kg/m  Physical Exam      Exam    General- alert and comfortable    Neck- no JVD, no cervical adenopathy palpable, no carotid bruit   Lungs-scattered rales rales, without wheezes   Cor- regular rate and rhythm, no murmur , gallop   Abdomen- soft,  non-tender   Extremities - warm, non-tender, minimal edema   Neuro- oriented, appropriate, no focal weakness   Diagnostic Tests: CTA images performed today were personally reviewed and discussed  with patient.  Impression: Stable mild-moderate fusiform aortic aneurysm measuring 4.5 cm.  No evidence of penetrating ulcer or intramural hematoma.  This represents low risk for tear/dissection and best therapy is continued blood pressure control and annual surveillance scan.  Guidelines recommend surgery for diameter 5.5 cm   Plan: Return for surveillance CT scan without contrast in 1 year.  Dahlia Byes, MD [facility] [phone]

## 2022-01-07 ENCOUNTER — Ambulatory Visit (INDEPENDENT_AMBULATORY_CARE_PROVIDER_SITE_OTHER): Payer: Medicare Other | Admitting: Podiatry

## 2022-01-07 ENCOUNTER — Other Ambulatory Visit: Payer: Self-pay

## 2022-01-07 DIAGNOSIS — L600 Ingrowing nail: Secondary | ICD-10-CM

## 2022-01-07 NOTE — Patient Instructions (Signed)

## 2022-01-11 NOTE — Progress Notes (Signed)
Subjective: 81 year old male presents the office today for evaluation of ingrown toenail to his left big toe.  He states he is feeling much better.  Denies any drainage or pus.  Still soaking Epsom salts.  Denies any fevers or chills.  No other concerns.  Objective: AAO x3, NAD DP/PT pulses palpable bilaterally, CRT less than 3 seconds Status post partial nail avulsion of the left hallux toenail.  Some of the granulation tissue still present a scab is formed.  There is still some minimal edema present but there is no drainage or pus or ascending cellulitis.  There is no signs of abscess.  No tenderness palpation.  No pain with calf compression, swelling, warmth, erythema  Assessment: Status post partial nail avulsion  Plan: -All treatment options discussed with the patient including all alternatives, risks, complications.  -Seen to be healing well at this time.  Continue Epson salt soaks and Cover with antibiotic ointment bandage and the debridement the area open at nighttime.  I would continue this until the area is healed.  Monitor for any signs or symptoms of infection. -Patient encouraged to call the office with any questions, concerns, change in symptoms.   Trula Slade DPM

## 2022-01-18 DIAGNOSIS — M1712 Unilateral primary osteoarthritis, left knee: Secondary | ICD-10-CM | POA: Diagnosis not present

## 2022-01-18 DIAGNOSIS — M1711 Unilateral primary osteoarthritis, right knee: Secondary | ICD-10-CM | POA: Diagnosis not present

## 2022-01-25 DIAGNOSIS — M1711 Unilateral primary osteoarthritis, right knee: Secondary | ICD-10-CM | POA: Diagnosis not present

## 2022-01-25 DIAGNOSIS — M1712 Unilateral primary osteoarthritis, left knee: Secondary | ICD-10-CM | POA: Diagnosis not present

## 2022-02-01 DIAGNOSIS — M1711 Unilateral primary osteoarthritis, right knee: Secondary | ICD-10-CM | POA: Diagnosis not present

## 2022-02-01 DIAGNOSIS — M1712 Unilateral primary osteoarthritis, left knee: Secondary | ICD-10-CM | POA: Diagnosis not present

## 2022-02-01 DIAGNOSIS — M25512 Pain in left shoulder: Secondary | ICD-10-CM | POA: Diagnosis not present

## 2022-02-09 ENCOUNTER — Other Ambulatory Visit: Payer: Self-pay

## 2022-02-09 MED ORDER — IRBESARTAN 150 MG PO TABS: ORAL_TABLET | ORAL | 2 refills | Status: DC

## 2022-02-24 DIAGNOSIS — H401131 Primary open-angle glaucoma, bilateral, mild stage: Secondary | ICD-10-CM | POA: Diagnosis not present

## 2022-03-05 DIAGNOSIS — M25512 Pain in left shoulder: Secondary | ICD-10-CM | POA: Diagnosis not present

## 2022-03-05 DIAGNOSIS — M1712 Unilateral primary osteoarthritis, left knee: Secondary | ICD-10-CM | POA: Diagnosis not present

## 2022-03-05 DIAGNOSIS — M1711 Unilateral primary osteoarthritis, right knee: Secondary | ICD-10-CM | POA: Diagnosis not present

## 2022-03-08 ENCOUNTER — Other Ambulatory Visit: Payer: Self-pay | Admitting: *Deleted

## 2022-03-08 DIAGNOSIS — R0609 Other forms of dyspnea: Secondary | ICD-10-CM

## 2022-03-08 DIAGNOSIS — E785 Hyperlipidemia, unspecified: Secondary | ICD-10-CM

## 2022-03-08 DIAGNOSIS — R079 Chest pain, unspecified: Secondary | ICD-10-CM

## 2022-03-08 MED ORDER — CLOPIDOGREL BISULFATE 75 MG PO TABS: 75.0000 mg | ORAL_TABLET | Freq: Every day | ORAL | 0 refills | Status: DC

## 2022-03-24 DIAGNOSIS — H401122 Primary open-angle glaucoma, left eye, moderate stage: Secondary | ICD-10-CM | POA: Diagnosis not present

## 2022-03-31 DIAGNOSIS — H401111 Primary open-angle glaucoma, right eye, mild stage: Secondary | ICD-10-CM | POA: Diagnosis not present

## 2022-04-05 DIAGNOSIS — I700 Atherosclerosis of aorta: Secondary | ICD-10-CM | POA: Diagnosis not present

## 2022-04-05 DIAGNOSIS — N1831 Chronic kidney disease, stage 3a: Secondary | ICD-10-CM | POA: Diagnosis not present

## 2022-04-05 DIAGNOSIS — M1990 Unspecified osteoarthritis, unspecified site: Secondary | ICD-10-CM | POA: Diagnosis not present

## 2022-04-05 DIAGNOSIS — R7303 Prediabetes: Secondary | ICD-10-CM | POA: Diagnosis not present

## 2022-04-05 DIAGNOSIS — I10 Essential (primary) hypertension: Secondary | ICD-10-CM | POA: Diagnosis not present

## 2022-04-05 DIAGNOSIS — M109 Gout, unspecified: Secondary | ICD-10-CM | POA: Diagnosis not present

## 2022-04-05 DIAGNOSIS — I712 Thoracic aortic aneurysm, without rupture, unspecified: Secondary | ICD-10-CM | POA: Diagnosis not present

## 2022-04-05 DIAGNOSIS — Z0000 Encounter for general adult medical examination without abnormal findings: Secondary | ICD-10-CM | POA: Diagnosis not present

## 2022-04-05 DIAGNOSIS — G720 Drug-induced myopathy: Secondary | ICD-10-CM | POA: Diagnosis not present

## 2022-04-05 DIAGNOSIS — K224 Dyskinesia of esophagus: Secondary | ICD-10-CM | POA: Diagnosis not present

## 2022-04-05 DIAGNOSIS — Z1331 Encounter for screening for depression: Secondary | ICD-10-CM | POA: Diagnosis not present

## 2022-04-05 DIAGNOSIS — M47812 Spondylosis without myelopathy or radiculopathy, cervical region: Secondary | ICD-10-CM | POA: Diagnosis not present

## 2022-04-05 DIAGNOSIS — K219 Gastro-esophageal reflux disease without esophagitis: Secondary | ICD-10-CM | POA: Diagnosis not present

## 2022-04-05 DIAGNOSIS — N400 Enlarged prostate without lower urinary tract symptoms: Secondary | ICD-10-CM | POA: Diagnosis not present

## 2022-04-19 NOTE — Progress Notes (Deleted)
Cardiology Office Note:    Date:  04/19/2022   ID:  Bob Elliott, DOB 1941/06/01, MRN [id number]  PCP:  [name], MD   [facility] Providers Cardiologist:  [name], MD {  Referring MD: [name], MD    History of Present Illness:    Bob Elliott is a 81 y.o. 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 Meda Coffee who now returns to clinic for CV follow-up.  Per review of the record, the patient has a history of amaurosis fugax and underwent cardiac monitor which showed SB-NSR and short runs NSVT 4 and 9 beats and no Afib. On 02/20/20, the patient was having DOE. Myoview normal LV function and no ischemia. Patient intolerant to ASA so started on Plavix b/c of recent TIA.  Saw Dr. Meda Coffee on 10/28/20, he was doing well. Continued to have mild DOE. No HF symptoms. Has been following with Dr. Orvan Seen for ascending aortic aneurysm, which was stable at 109m on last CTA in 11/2020. Myoview 02/26/20 with normal EF, no evidence of ischemia.   Was last seen in clinic 10/2021 where he was doing well from CV standpoint. Stopped lipitor due to knee pain but was willing to start zetia. RLE doppler obtained due to RLE edema which showed no evidence of DVT.  Today, ***  Past Medical History:  Diagnosis Date   Arthritis    BPH (benign prostatic hyperplasia)    CKD (chronic kidney disease), stage II    a. CKD II-III by labs.   Dizziness 10/21/2016   Esophageal dysmotility    Essential hypertension    Trial off acei 08/12/2017 for unexplained sob/ noct smothering    GERD (gastroesophageal reflux disease)    GI bleed    Glaucoma    BOTH EYES   Hyperlipidemia    Hypertension    Inguinal hernia without mention of obstruction or gangrene, unilateral or unspecified, (not specified as recurrent)-right s/p repair with mesh 05/21/2014   Schatzki's ring    Sinus bradycardia    a.  prompting dc of Diltiazem (?chronotropic incompetence) in 2017.   Sleep apnea    "mild" no cpap   Thoracic aortic aneurysm (TAA)     Past Surgical History:  Procedure Laterality Date   COLONOSCOPY WITH PROPOFOL N/A 05/11/2016   Procedure: COLONOSCOPY WITH PROPOFOL;  Surgeon: MGarlan Fair MD;  Location: [facility];  Service: Endoscopy;  Laterality: N/A;   EYE SURGERY     CATARACT R  EYE   HERNIA REPAIR     UMBILICAL   INGUINAL HERNIA REPAIR Right 06/18/2014   Procedure: RIGHT INGUINAL HERNIA REPAIR;  Surgeon: TOdis Hollingshead MD;  [place]  Service: General;  Laterality: Right;   INSERTION OF MESH Right 06/18/2014   Procedure: INSERTION OF MESH;  Surgeon: TOdis Hollingshead MD;  [place]  Service: General;  Laterality: Right;   NECK SURGERY  2000   cervical disc surgery(limited ROM)- retained hardware   TONSILLECTOMY      Current Medications: No outpatient medications have been marked as taking for the 04/23/22 encounter (Appointment) with PFreada Bergeron MD.     Allergies:   Aspirin, Codeine, Shellfish allergy, Amitriptyline, Gabapentin, Other, Tramadol hcl, and Zetia [ezetimibe]   Social History   Socioeconomic History   Marital status: Married    Spouse name: Not on file   Number of children:  4   Years of education: Not on file   Highest education level: Not on file  Occupational History   Occupation: retired  Tobacco Use   Smoking status: Never   Smokeless tobacco: Never  Vaping Use   Vaping Use: Never used  Substance and Sexual Activity   Alcohol use: Yes    Comment: none since 80's   Drug use: No   Sexual activity: Not Currently  Other Topics Concern   Not on file  Social History Narrative   Not on file   Social Determinants of Health   Financial Resource Strain: Not on file  Food Insecurity: Not on file  Transportation Needs: Not on file  Physical Activity: Not on file  Stress: Not on file  Social Connections: Not on file      Family History: The patient's family history includes Cancer in his brother; Diabetes in his sister; Heart disease in his mother; Leukemia in his maternal grandmother; Stroke in his father.  ROS:   Please see the history of present illness.    Review of Systems  Constitutional:  Negative for chills and fever.  HENT:  Negative for hearing loss.   Eyes:  Negative for blurred vision.  Respiratory:  Positive for shortness of breath.   Cardiovascular:  Positive for leg swelling. Negative for chest pain, palpitations, orthopnea, claudication and PND.  Gastrointestinal:  Negative for melena.  Genitourinary:  Negative for dysuria and hematuria.  Musculoskeletal:  Positive for back pain, joint pain and myalgias.  Neurological:  Positive for dizziness. Negative for loss of consciousness.  Psychiatric/Behavioral:  Negative for substance abuse.    EKGs/Labs/Other Studies Reviewed:    The following studies were reviewed today: Myoview 02/26/20: Nuclear stress EF: 58%. The left ventricular ejection fraction is normal (55-65%). There was no ST segment deviation noted during stress. This is a low risk study.   Apical thinning not thought to be significant No ischemia EF 58%  Cardiac Monitor 11/2019:  Sinus bradycardia to sinus rhythm. Two very short runs of nsVT - 4 and 9 beats.   Sinus bradycardia to sinus rhythm. Two very short runs of nsVT - 4 and 9 beats. No atrial fibrillation.  TTE 09/2019: IMPRESSIONS   1. Left ventricular ejection fraction, by visual estimation, is 60 to  65%. The left ventricle has normal function. There is mildly increased  left ventricular hypertrophy.   2. Left ventricular diastolic parameters are consistent with Grade I  diastolic dysfunction (impaired relaxation).   3. The left ventricle has no regional wall motion abnormalities.   4. Global right ventricle has normal systolic function.The right  ventricular size is normal. No increase in right  ventricular wall  thickness.   5. Left atrial size was normal.   6. Right atrial size was normal.   7. The mitral valve is normal in structure. No evidence of mitral valve  regurgitation. No evidence of mitral stenosis.   8. The tricuspid valve is normal in structure. Tricuspid valve  regurgitation is not demonstrated.   9. The aortic valve is tricuspid. Aortic valve regurgitation is mild. No  evidence of aortic valve sclerosis or stenosis.  10. There is mild dilatation of the aortic root measuring 41 mm.  11. TR signal is inadequate for assessing pulmonary artery systolic  pressure.  12. The inferior vena cava is normal in size with greater than 50%  respiratory variability, suggesting right atrial pressure of 3 mmHg.    Carotid US 09/20/2019 Summary: Right Carotid: Velocities  in the right ICA are consistent with a 1-39% stenosis. Left Carotid: Velocities in the left ICA are consistent with a 1-39% stenosis. Vertebrals: Bilateral vertebral arteries demonstrate antegrade flow.   Chest CTA 03/26/2019 IMPRESSION: 1. No significant change in caliber of the thoracic aorta, measuring approximately 4.1 x 4.0 cm in the tubular ascending thoracic aorta, previously 4.3 x 4.2 cm when measured similarly. The aortic root is normal in caliber. The sinuses of Valsalva are enlarged up to 4.3 cm. The distal aortic arch and descending thoracic aorta are normal in caliber. Minimal atherosclerosis.   Chest CTA 05/2020 Interval increase in size of known thoracic aortic aneurysm now measuring up to 4.6 cm (previously measured 4.0 x 4.0 cm). Greater than 5 mm growth over the past 12 months is associated with an increased risk of aneurysm rupture. 2. Diffuse bilateral bronchial wall thickening, consistent with nonspecific infectious or inflammatory bronchitis.   Myoview 09/06/16 Nuclear stress EF: 53%. Blood pressure demonstrated a normal response to exercise. There was no ST segment deviation noted  during stress. This is a low risk study. The left ventricular ejection fraction is mildly decreased (45-54%). Inferior and apical thinning consistent with soft tissue attenuaion (diaphragm), cannot exclude small subendocardial scar No ichemia   Echocardiogram 09/06/16 Mild septal hypertrophy with mod post wall hypertrophy, EF 55-60, no RWMA, Gr 1 DD, mild AI, trivial MR, mild RAE, GLS -17.2%   EKG:  No new ECG today  Recent Labs: 06/24/2021: ALT 19; BUN 16; Creatinine, Ser 1.38; Hemoglobin 14.3; Platelets 204; Potassium 4.4; Sodium 131  Recent Lipid Panel    Component Value Date/Time   CHOL 96 (L) 02/20/2020 0937   TRIG 75 02/20/2020 0937   HDL 46 02/20/2020 0937   CHOLHDL 2.1 02/20/2020 0937   CHOLHDL 4.0 09/20/2019 0740   VLDL 19 09/20/2019 0740   LDLCALC 34 02/20/2020 0937      Physical Exam:    VS:  There were no vitals taken for this visit.    Wt Readings from Last 3 Encounters:  01/04/22 210 lb (95.3 kg)  10/16/21 210 lb 12.8 oz (95.6 kg)  06/24/21 205 lb (93 kg)     GEN:  Comfortable, ambulates with walker HEENT: Normal NECK: No JVD; No carotid bruits CARDIAC: RRR, no murmurs, rubs, gallops RESPIRATORY:  Clear to auscultation without rales, wheezing or rhonchi  ABDOMEN: Soft, non-tender, non-distended MUSCULOSKELETAL:  1+ asymmetric LE edema in right leg to the mid-shin. LLE without edema. SKIN: Warm and dry NEUROLOGIC:  Alert and oriented x 3 PSYCHIATRIC:  Normal affect   ASSESSMENT:    No diagnosis found.  PLAN:    In order of problems listed above:  #DOE: Stable. Reassuring cardiac work-up. Myoview negative for ischemia or infarction; normal LVEF. Suspect secondary to deconditioning given reassuring CV workup. -Negative ischemic work-up -Discussed importance of continued exercise to help with conditioning with goal 167mn moderate intensity exercise per week  #Thoracic Aortic Aneurysm: Measured 4.1cm on 03/2019, 4.6cm on 05/2020, and 4.3cm on  11/2020. Follows with Dr. AOrvan Seen -Continue irbesartan '150mg'$  daily -Continue lipitor '10mg'$  2x/week -Unable to tolerate ASA; on plavix -Follow-up with Dr. AOrvan Seenas scheduled  #HTN: Stable with no further episodes of low BP.  -Decreased amlodipine to '5mg'$  daily due to episodes of low BP -Continue irbesartan '150mg'$  daily -Continue spironolactone 12.'5mg'$  daily  #HLD: LDL 81 03/2021. Stopped statin due to joint pain and does not want to re-trial with different agent. Ideally, goal would be <70 given history of amaurosis fugax. -  Continue zetia '10mg'$  daily -Off statin due to joint pain -Continue lifestyle modifications with diet and increased exercise  #Amaurosis Fugax: 30day monitor without Afib. No recurrence of Afib -On plavix '75mg'$  daily -Continue zetia '10mg'$  daily as above; did not tolerate statins due to joint pain--does not want to retrial -Intolerant of ASA  #RLE Edema: #RLE Calf Pain: RLE doppler negative for DVT. Overall pain improved.   Medication Adjustments/Labs and Tests Ordered: Current medicines are reviewed at length with the patient today.  Concerns regarding medicines are outlined above.  No orders of the defined types were placed in this encounter.  No orders of the defined types were placed in this encounter.   There are no Patient Instructions on file for this visit.    Signed, Freada Bergeron, MD  04/19/2022 1:47 PM    [facility]

## 2022-04-23 ENCOUNTER — Ambulatory Visit (INDEPENDENT_AMBULATORY_CARE_PROVIDER_SITE_OTHER): Payer: Medicare Other | Admitting: Cardiology

## 2022-04-23 ENCOUNTER — Encounter: Payer: Self-pay | Admitting: Cardiology

## 2022-04-23 ENCOUNTER — Other Ambulatory Visit: Payer: Medicare Other

## 2022-04-23 VITALS — BP 124/70 | HR 66 | Ht 68.0 in | Wt 208.2 lb

## 2022-04-23 DIAGNOSIS — E785 Hyperlipidemia, unspecified: Secondary | ICD-10-CM

## 2022-04-23 DIAGNOSIS — I10 Essential (primary) hypertension: Secondary | ICD-10-CM

## 2022-04-23 DIAGNOSIS — I7123 Aneurysm of the descending thoracic aorta, without rupture: Secondary | ICD-10-CM

## 2022-04-23 DIAGNOSIS — Z79899 Other long term (current) drug therapy: Secondary | ICD-10-CM | POA: Diagnosis not present

## 2022-04-23 DIAGNOSIS — I491 Atrial premature depolarization: Secondary | ICD-10-CM

## 2022-04-23 DIAGNOSIS — G453 Amaurosis fugax: Secondary | ICD-10-CM | POA: Diagnosis not present

## 2022-04-23 DIAGNOSIS — R0609 Other forms of dyspnea: Secondary | ICD-10-CM | POA: Diagnosis not present

## 2022-04-23 LAB — LIPID PANEL
Chol/HDL Ratio: 3.4 ratio (ref 0.0–5.0)
Cholesterol, Total: 171 mg/dL (ref 100–199)
HDL: 51 mg/dL (ref >39)
LDL Chol Calc (NIH): 102 mg/dL — ABNORMAL HIGH (ref 0–99)
Triglycerides: 100 mg/dL (ref 0–149)
VLDL Cholesterol Cal: 18 mg/dL (ref 5–40)

## 2022-04-23 NOTE — Progress Notes (Signed)
Cardiology Office Note:    Date:  04/23/2022   ID:  [name], DOB 31-Aug-1941, MRN [id number]  PCP:  [name], MD   [facility] Providers Cardiologist:  [name], MD {  Referring MD: [name], MD    History of Present Illness:    Bob Elliott is a 81 y.o. 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 Meda Coffee who now returns to clinic for CV follow-up.  Per review of the record, the patient has a history of amaurosis fugax and underwent cardiac monitor which showed SB-NSR and short runs NSVT 4 and 9 beats and no Afib. On 02/20/20, the patient was having DOE. Myoview normal LV function and no ischemia. Patient intolerant to ASA so started on Plavix b/c of recent TIA.  Saw Dr. Meda Coffee on 10/28/20, he was doing well. Continued to have mild DOE. No HF symptoms. Has been following with Dr. Orvan Seen for ascending aortic aneurysm, which was stable at 7m on last CTA in 11/2020. Myoview 02/26/20 with normal EF, no evidence of ischemia.   Was last seen in clinic 10/2021 where he was doing well from CV standpoint. Stopped lipitor due to knee pain but was willing to start zetia. RLE doppler obtained due to RLE edema which showed no evidence of DVT.  Today, the patient feels well from a CV perspective. Has been having shoulder pain and sees ortho for arthritis. Have not taken his medication this morning. Exercises on stationary bike 2-3x week, walks in grocery store, and fishes. No desires to restart lipitor at this time. No anginal or HF symptoms.   Past Medical History:  Diagnosis Date   Arthritis    BPH (benign prostatic hyperplasia)    CKD (chronic kidney disease), stage II    a. CKD II-III by labs.   Dizziness 10/21/2016   Esophageal dysmotility    Essential hypertension    Trial off acei 08/12/2017 for unexplained sob/ noct smothering    GERD (gastroesophageal  reflux disease)    GI bleed    Glaucoma    BOTH EYES   Hyperlipidemia    Hypertension    Inguinal hernia without mention of obstruction or gangrene, unilateral or unspecified, (not specified as recurrent)-right s/p repair with mesh 05/21/2014   Schatzki's ring    Sinus bradycardia    a. prompting dc of Diltiazem (?chronotropic incompetence) in 2017.   Sleep apnea    "mild" no cpap   Thoracic [place] aneurysm (TAA) ([place]     Past Surgical History:  Procedure Laterality Date   COLONOSCOPY WITH PROPOFOL N/A 05/11/2016   Procedure: COLONOSCOPY WITH PROPOFOL;  Surgeon: MGarlan Fair MD;  Location: [facility];  Service: Endoscopy;  Laterality: N/A;   EYE SURGERY     CATARACT R  EYE   HERNIA REPAIR     UMBILICAL   INGUINAL HERNIA REPAIR Right 06/18/2014   Procedure: RIGHT INGUINAL HERNIA REPAIR;  Surgeon: TOdis Hollingshead MD;  [place] [facility]  Service: General;  Laterality: Right;   INSERTION OF MESH Right 06/18/2014   Procedure: INSERTION OF MESH;  Surgeon: TOdis Hollingshead MD;  Location: [facility]  Service: General;  Laterality: Right;   NECK SURGERY  2000   cervical disc surgery(limited ROM)- retained hardware   TONSILLECTOMY      Current Medications: Current Meds  Medication Sig   amLODipine (NORVASC) 5 MG tablet  Take 1 tablet (5 mg total) by mouth daily.   clopidogrel (PLAVIX) 75 MG tablet Take 1 tablet (75 mg total) by mouth daily.   doxazosin (CARDURA) 4 MG tablet Take 4 mg by mouth at bedtime.    doxycycline (VIBRA-TABS) 100 MG tablet Take 1 tablet (100 mg total) by mouth 2 (two) times daily.   fluticasone (FLONASE) 50 MCG/ACT nasal spray Place 2 sprays into both nostrils daily as needed for allergies or rhinitis.    guaiFENesin-dextromethorphan (ROBITUSSIN DM) 100-10 MG/5ML syrup Take 5 mLs by mouth 3 (three) times daily as needed for cough.   irbesartan (AVAPRO) 150 MG tablet TAKE 1 TABLET DAILY (DOSE DECREASE)   loratadine (CLARITIN) 10 MG tablet Take 10 mg by mouth  daily.   mupirocin ointment (BACTROBAN) 2 % Apply 1 application topically 2 (two) times daily.   PAXLOVID 20 x 150 MG & 10 x '100MG'$  TBPK Take 3 tablets by mouth 2 (two) times daily.   potassium chloride (KLOR-CON) 10 MEQ tablet Take 3 tablets (30 mEq total) by mouth 2 (two) times daily.   predniSONE (DELTASONE) 5 MG tablet Take 10-70 mg by mouth daily. Take 7 tab po on Monday after meal. Take 6 tab po on Tuesday after meal. Take 5 tab po on Wednesday after meal. Take 4 tab po on Thursday after meal. Take 3 tab po on Friday after meal. Take 2 tab po on Saturday after meal. Take 1 tab po on Sunday after meal. Started 06/22/21 End 06/28/21.   Red Yeast Rice 600 MG TABS Take 1 tablet (600 mg total) by mouth daily.   solifenacin (VESICARE) 5 MG tablet Take 5 mg by mouth daily.   spironolactone (ALDACTONE) 25 MG tablet Take 0.5 tablets (12.5 mg total) by mouth daily.   sucralfate (CARAFATE) 1 GM/10ML suspension Take 10 mLs (1 g total) by mouth 2 (two) times daily.   timolol (TIMOPTIC) 0.5 % ophthalmic solution Place 1 drop into both eyes daily.      Allergies:   Aspirin, Codeine, Shellfish allergy, Amitriptyline, Gabapentin, Other, Tramadol hcl, and Zetia [ezetimibe]   Social History   Socioeconomic History   Marital status: Married    Spouse name: Not on file   Number of children: 4   Years of education: Not on file   Highest education level: Not on file  Occupational History   Occupation: retired  Tobacco Use   Smoking status: Never   Smokeless tobacco: Never  Vaping Use   Vaping Use: Never used  Substance and Sexual Activity   Alcohol use: Yes    Comment: none since 80's   Drug use: No   Sexual activity: Not Currently  Other Topics Concern   Not on file  Social History Narrative   Not on file   Social Determinants of Health   Financial Resource Strain: Not on file  Food Insecurity: Not on file  Transportation Needs: Not on file  Physical Activity: Not on file  Stress: Not on  file  Social Connections: Not on file     Family History: The patient's family history includes Cancer in his brother; Diabetes in his sister; Heart disease in his mother; Leukemia in his maternal grandmother; Stroke in his father.  ROS:   Please see the history of present illness.    Review of Systems  Constitutional:  Negative for chills and fever.  HENT:  Negative for hearing loss.   Eyes:  Negative for blurred vision.  Respiratory:  Positive for shortness of  breath.   Cardiovascular:  Negative for chest pain, palpitations, orthopnea, claudication, leg swelling and PND.  Gastrointestinal:  Negative for melena.  Genitourinary:  Negative for dysuria and hematuria.  Musculoskeletal:  Positive for back pain, joint pain and myalgias.  Neurological:  Positive for dizziness. Negative for loss of consciousness.  Psychiatric/Behavioral:  Negative for substance abuse.     EKGs/Labs/Other Studies Reviewed:    The following studies were reviewed today: Myoview 02/26/20: Nuclear stress EF: 58%. The left ventricular ejection fraction is normal (55-65%). There was no ST segment deviation noted during stress. This is a low risk study.   Apical thinning not thought to be significant No ischemia EF 58%  Cardiac Monitor 11/2019:  Sinus bradycardia to sinus rhythm. Two very short runs of nsVT - 4 and 9 beats.   Sinus bradycardia to sinus rhythm. Two very short runs of nsVT - 4 and 9 beats. No atrial fibrillation.  TTE 09/2019: IMPRESSIONS   1. Left ventricular ejection fraction, by visual estimation, is 60 to  65%. The left ventricle has normal function. There is mildly increased  left ventricular hypertrophy.   2. Left ventricular diastolic parameters are consistent with Grade I  diastolic dysfunction (impaired relaxation).   3. The left ventricle has no regional wall motion abnormalities.   4. Global right ventricle has normal systolic function.The right  ventricular size is  normal. No increase in right ventricular wall  thickness.   5. Left atrial size was normal.   6. Right atrial size was normal.   7. The mitral valve is normal in structure. No evidence of mitral valve  regurgitation. No evidence of mitral stenosis.   8. The tricuspid valve is normal in structure. Tricuspid valve  regurgitation is not demonstrated.   9. The aortic valve is tricuspid. Aortic valve regurgitation is mild. No  evidence of aortic valve sclerosis or stenosis.  10. There is mild dilatation of the aortic root measuring 41 mm.  11. TR signal is inadequate for assessing pulmonary artery systolic  pressure.  12. The inferior vena cava is normal in size with greater than 50%  respiratory variability, suggesting right atrial pressure of 3 mmHg.    Carotid US 09/20/2019 Summary: Right Carotid: Velocities in the right ICA are consistent with a 1-39% stenosis. Left Carotid: Velocities in the left ICA are consistent with a 1-39% stenosis. Vertebrals: Bilateral vertebral arteries demonstrate antegrade flow.   Chest CTA 03/26/2019 IMPRESSION: 1. No significant change in caliber of the thoracic aorta, measuring approximately 4.1 x 4.0 cm in the tubular ascending thoracic aorta, previously 4.3 x 4.2 cm when measured similarly. The aortic root is normal in caliber. The sinuses of Valsalva are enlarged up to 4.3 cm. The distal aortic arch and descending thoracic aorta are normal in caliber. Minimal atherosclerosis.   Chest CTA 05/2020 Interval increase in size of known thoracic aortic aneurysm now measuring up to 4.6 cm (previously measured 4.0 x 4.0 cm). Greater than 5 mm growth over the past 12 months is associated with an increased risk of aneurysm rupture. 2. Diffuse bilateral bronchial wall thickening, consistent with nonspecific infectious or inflammatory bronchitis.   Myoview 09/06/16 Nuclear stress EF: 53%. Blood pressure demonstrated a normal response to exercise. There was  no ST segment deviation noted during stress. This is a low risk study. The left ventricular ejection fraction is mildly decreased (45-54%). Inferior and apical thinning consistent with soft tissue attenuaion (diaphragm), cannot exclude small subendocardial scar No ichemia   Echocardiogram 09/06/16  Mild septal hypertrophy with mod post wall hypertrophy, EF 55-60, no RWMA, Gr 1 DD, mild AI, trivial MR, mild RAE, GLS -17.2%   EKG:  SR, PACs, 1st degree AV block  Recent Labs: 06/24/2021: ALT 19; BUN 16; Creatinine, Ser 1.38; Hemoglobin 14.3; Platelets 204; Potassium 4.4; Sodium 131  Recent Lipid Panel    Component Value Date/Time   CHOL 96 (L) 02/20/2020 0937   TRIG 75 02/20/2020 0937   HDL 46 02/20/2020 0937   CHOLHDL 2.1 02/20/2020 0937   CHOLHDL 4.0 09/20/2019 0740   VLDL 19 09/20/2019 0740   LDLCALC 34 02/20/2020 0937      Physical Exam:    VS:  BP 124/70   Pulse 66   Ht '5\' 8"'$  (1.727 m)   Wt 208 lb 3.2 oz (94.4 kg)   SpO2 96%   BMI 31.66 kg/m     Wt Readings from Last 3 Encounters:  04/23/22 208 lb 3.2 oz (94.4 kg)  01/04/22 210 lb (95.3 kg)  10/16/21 210 lb 12.8 oz (95.6 kg)     GEN:  Comfortable, NAD HEENT: Normal NECK: No JVD; No carotid bruits CARDIAC: Regular rate with irregular rhythm, soft systolic murmur RUSB. No rubs, gallops RESPIRATORY:  Clear to auscultation without rales, wheezing or rhonchi  ABDOMEN: Soft, non-tender, non-distended MUSCULOSKELETAL: BLE without edema SKIN: Warm and dry NEUROLOGIC:  Alert and oriented x 3 PSYCHIATRIC:  Normal affect   ASSESSMENT:    1. DOE (dyspnea on exertion)   2. Aneurysm of descending thoracic aorta without rupture (HCC)   3. Essential hypertension   4. Hyperlipidemia, unspecified hyperlipidemia type   5. Medication management   6. Amaurosis fugax   7. PAC (premature atrial contraction)    PLAN:    In order of problems listed above:  #DOE: Stable. Reassuring cardiac work-up. Myoview negative for  ischemia or infarction; normal LVEF. Suspect secondary to deconditioning given reassuring CV workup. -Negative ischemic work-up -Discussed importance of continued exercise to help with conditioning with goal 137mn moderate intensity exercise per week  #Thoracic Aortic Aneurysm: Measured 4.1cm on 03/2019, 4.6cm on 05/2020, and 4.3cm on 11/2020. Follows with Dr. AOrvan Seen -Continue irbesartan '150mg'$  daily -Continue lipitor '10mg'$  2x/week -Unable to tolerate ASA; on plavix -Follow-up with Dr. AOrvan Seenas scheduled  #HTN: Stable with no further episodes of low BP.  -Continue amlodipine to '5mg'$  daily -Continue irbesartan '150mg'$  daily -Continue spironolactone 12.'5mg'$  daily  #HLD: LDL 81 03/2021. Stopped statin due to joint pain and does not want to re-trial. Ideally, goal would be <70 given history of amaurosis fugax. -Continue zetia '10mg'$  daily -Off statin due to joint pain -Continue lifestyle modifications with diet and increased exercise -Lipids pending  #Amaurosis Fugax: 30day monitor without Afib. No recurrence of Afib. Irregular rhythm on physical exam today. EKG w/ NSR and PACs -On plavix '75mg'$  daily -Continue zetia '10mg'$  daily as above; did not tolerate statins due to joint pain--does not want to retrial -Intolerant of ASA  #PACs On EKG today. 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 Repeat 09/2023  Medication Adjustments/Labs and Tests Ordered: Current medicines are reviewed at length with the patient today.  Concerns regarding medicines are outlined above.  Orders Placed This Encounter  Procedures   EKG 12-Lead   No orders of the defined types were placed in this encounter.   Patient Instructions  Medication Instructions:   Your physician recommends that you continue on your current medications as directed. Please refer to the Current Medication list given to you  today.  *If you need a refill on your cardiac medications before your next appointment, please call your  pharmacy*   Follow-Up: At [facility], you and your health needs are our priority.  As part of our continuing mission to provide you with exceptional heart care, we have created designated Provider Care Teams.  These Care Teams include your primary Cardiologist (physician) and Advanced Practice Providers (APPs -  Physician Assistants and Nurse Practitioners) who all work together to provide you with the care you need, when you need it.  We recommend signing up for the patient portal called "MyChart".  Sign up information is provided on this After Visit Summary.  MyChart is used to connect with patients for Virtual Visits (Telemedicine).  Patients are able to view lab/test results, encounter notes, upcoming appointments, etc.  Non-urgent messages can be sent to your provider as well.   To learn more about what you can do with MyChart, go to [email or web address].    Your next appointment:   6 month(s)  The format for your next appointment:   In Person  Provider:   Dr. Johney Frame   Important Information About Sugar          Signed, Gerlene Fee, DO 04/23/2022 9:59 AM    [place]  Patient seen and examined and agree with Dr. Gerlene Fee, DO as detailed above. Plan edited by me. Overall doing well. Lipids pending for today. Will need continued monitoring of AI with next TTE 09/2023.  Gwyndolyn Kaufman, MD

## 2022-04-23 NOTE — Patient Instructions (Signed)

## 2022-04-26 ENCOUNTER — Telehealth: Payer: Self-pay | Admitting: *Deleted

## 2022-04-26 MED ORDER — PRAVASTATIN SODIUM 40 MG PO TABS: 40.0000 mg | ORAL_TABLET | Freq: Every evening | ORAL | 1 refills | Status: DC

## 2022-04-26 NOTE — Telephone Encounter (Signed)
The patient has been notified of the result and verbalized understanding.  All questions (if any) were answered.  Pt states he is willing to try pravastatin 40 mg po daily and see if he tolerates this appropriately. He is requesting that we only send in a months supply to his confirmed pharmacy of choice, to make sure he tolerates this accordingly.  He states he will call our office and let us know if he's tolerating this or not, so that we can kick a 90 day supply to his mail order accordingly thereafter.  Pt verbalized understanding and agrees with this plan.  Did advise him to stop taking red yeast rice while starting this medication.  Pt states he will not take red yeast rice, for he has not been taking it anyway's.  Pt verbalized understanding and agrees with this plan.

## 2022-04-26 NOTE — Telephone Encounter (Signed)
-----   Message from Freada Bergeron, MD sent at 04/25/2022  1:02 PM EDT ----- His ldl is above goal. Does he want to retrial a statin? We could do pravastatin '40mg'$  which is usually better tolerated.

## 2022-05-13 DIAGNOSIS — R053 Chronic cough: Secondary | ICD-10-CM | POA: Diagnosis not present

## 2022-05-13 DIAGNOSIS — R4702 Dysphasia: Secondary | ICD-10-CM | POA: Diagnosis not present

## 2022-05-24 ENCOUNTER — Other Ambulatory Visit: Payer: Self-pay

## 2022-05-24 MED ORDER — PRAVASTATIN SODIUM 40 MG PO TABS: 40.0000 mg | ORAL_TABLET | Freq: Every evening | ORAL | 11 refills | Status: DC

## 2022-06-04 ENCOUNTER — Other Ambulatory Visit: Payer: Self-pay

## 2022-06-04 DIAGNOSIS — R079 Chest pain, unspecified: Secondary | ICD-10-CM

## 2022-06-04 DIAGNOSIS — R0609 Other forms of dyspnea: Secondary | ICD-10-CM

## 2022-06-04 DIAGNOSIS — E785 Hyperlipidemia, unspecified: Secondary | ICD-10-CM

## 2022-06-04 MED ORDER — CLOPIDOGREL BISULFATE 75 MG PO TABS: 75.0000 mg | ORAL_TABLET | Freq: Every day | ORAL | 3 refills | Status: DC

## 2022-06-16 DIAGNOSIS — K224 Dyskinesia of esophagus: Secondary | ICD-10-CM | POA: Diagnosis not present

## 2022-06-16 DIAGNOSIS — K219 Gastro-esophageal reflux disease without esophagitis: Secondary | ICD-10-CM | POA: Diagnosis not present

## 2022-06-16 DIAGNOSIS — R1310 Dysphagia, unspecified: Secondary | ICD-10-CM | POA: Diagnosis not present

## 2022-06-17 ENCOUNTER — Telehealth (HOSPITAL_COMMUNITY): Payer: Self-pay

## 2022-06-17 ENCOUNTER — Other Ambulatory Visit (HOSPITAL_COMMUNITY): Payer: Self-pay

## 2022-06-17 DIAGNOSIS — R059 Cough, unspecified: Secondary | ICD-10-CM

## 2022-06-17 DIAGNOSIS — R1310 Dysphagia, unspecified: Secondary | ICD-10-CM

## 2022-06-17 NOTE — Telephone Encounter (Signed)
Attempted to contact patient to schedule Modified Barium Swallow - left voicemail. 

## 2022-06-29 ENCOUNTER — Ambulatory Visit (HOSPITAL_COMMUNITY)
Admission: RE | Admit: 2022-06-29 | Discharge: 2022-06-29 | Disposition: A | Discharge status: Home / Self Care | Payer: Medicare Other | Source: Ambulatory Visit | Attending: Internal Medicine | Admitting: Internal Medicine

## 2022-06-29 DIAGNOSIS — I129 Hypertensive chronic kidney disease with stage 1 through stage 4 chronic kidney disease, or unspecified chronic kidney disease: Secondary | ICD-10-CM | POA: Diagnosis not present

## 2022-06-29 DIAGNOSIS — R1310 Dysphagia, unspecified: Secondary | ICD-10-CM

## 2022-06-29 DIAGNOSIS — K219 Gastro-esophageal reflux disease without esophagitis: Secondary | ICD-10-CM | POA: Diagnosis not present

## 2022-06-29 DIAGNOSIS — G4733 Obstructive sleep apnea (adult) (pediatric): Secondary | ICD-10-CM | POA: Insufficient documentation

## 2022-06-29 DIAGNOSIS — R490 Dysphonia: Secondary | ICD-10-CM | POA: Insufficient documentation

## 2022-06-29 DIAGNOSIS — R059 Cough, unspecified: Secondary | ICD-10-CM | POA: Diagnosis not present

## 2022-06-29 DIAGNOSIS — K224 Dyskinesia of esophagus: Secondary | ICD-10-CM | POA: Insufficient documentation

## 2022-06-29 NOTE — Progress Notes (Signed)
Modified Barium Swallow Progress Note  Patient Details  Name: Bob Elliott MRN: [id number] Date of Birth: [birth year]  Today's Date: 06/29/2022  Modified Barium Swallow completed.  Full report located under Chart Review in the Imaging Section.  Brief recommendations include the following:  Clinical Impression  Pt's oropharyngeal swallowing appears to be [employer] It is timely, efficient, and protective of his airway. No frank penetration or aspiration is observed. Of note, pt's barium tablet did appear to get stopped around the level of the GE junction (see radiologist note). Pt's esophageal hx and presenting symptoms (burning inchest/throat, trouble with solids > liquids, fluctuating hoarseness, pt feeling like he is refluxing into his throat) could be concerning for a primary esophageal component. Handout provided with basic esophageal precautions. No further SLP f/u indicated at this time. Did discuss the potential for cough suppression services, but pt does not think he coughs very much and declines this at this time. Would defer any additional f/u to MD, but would consider a more dedicated assessment of the esophagus and/or GI/ENT consult.   Swallow Evaluation Recommendations       SLP Diet Recommendations: Regular solids;Thin liquid   Liquid Administration via: Cup;Straw   Medication Administration: Whole meds with liquid (may want to crush if larger (and able))   Supervision: Patient able to self feed   Compensations: Slow rate;Small sips/bites;Follow solids with liquid   Postural Changes: Seated upright at 90 degrees;Remain semi-upright after after feeds/meals (Comment)   Oral Care Recommendations: Oral care BID        Osie Bond., M.A. [place] Office [phone]  Secure chat preferred  06/29/2022,2:12 PM

## 2022-06-30 ENCOUNTER — Other Ambulatory Visit: Payer: Self-pay | Admitting: Gastroenterology

## 2022-06-30 DIAGNOSIS — K219 Gastro-esophageal reflux disease without esophagitis: Secondary | ICD-10-CM

## 2022-06-30 DIAGNOSIS — R1310 Dysphagia, unspecified: Secondary | ICD-10-CM

## 2022-07-01 ENCOUNTER — Ambulatory Visit
Admission: RE | Admit: 2022-07-01 | Discharge: 2022-07-01 | Disposition: A | Discharge status: Home / Self Care | Payer: Medicare Other | Source: Ambulatory Visit | Attending: Gastroenterology | Admitting: Gastroenterology

## 2022-07-01 DIAGNOSIS — R1310 Dysphagia, unspecified: Secondary | ICD-10-CM

## 2022-07-01 DIAGNOSIS — K219 Gastro-esophageal reflux disease without esophagitis: Secondary | ICD-10-CM | POA: Diagnosis not present

## 2022-07-01 DIAGNOSIS — K224 Dyskinesia of esophagus: Secondary | ICD-10-CM | POA: Diagnosis not present

## 2022-07-09 ENCOUNTER — Telehealth: Payer: Self-pay | Admitting: Cardiology

## 2022-07-09 NOTE — Telephone Encounter (Signed)
Called patient back to let him know he will need to contact the office that is doing his procedure. Informed patient that the doctor that is doing the procedure usually sends [place] a clearance and then our office will send response back. Will forward to our pre op pool so they will be on the look out for fax. Will send to Dr. Johney Frame as Juluis Rainier.

## 2022-07-09 NOTE — Telephone Encounter (Signed)
Pt states he  had an esophagus test done and he was told that our office would be giving [place] a call about a surgery to stretch his throat.

## 2022-07-15 ENCOUNTER — Telehealth: Payer: Self-pay | Admitting: *Deleted

## 2022-07-15 NOTE — Telephone Encounter (Signed)
   Pre-operative Risk Assessment    Patient Name: Bob Elliott  DOB: 02/21/1941 MRN: [id number]      Request for Surgical Clearance    Procedure:   ENDOSCOPY WITH DILATION  Date of Surgery:  Clearance TBD                                 Surgeon:  Deliah Goody, [facility]'s Group or Practice Name:  [facility] Phone number:  [phone] Fax number:  [phone]   Type of Clearance Requested:   - Medical  - Pharmacy:  Hold Clopidogrel (Plavix) x 5 DAYS PRIOR   Type of Anesthesia:   PROPOFOL    Additional requests/questions:    Jiles Prows   07/15/2022, 12:57 PM

## 2022-07-16 NOTE — Telephone Encounter (Signed)
Dr. Johney Frame, can the patient hold Plavix for 5 days prior to the procedure?  He is on Plavix due to history of amaurosis fugax/TIA.  Plavix is being prescribed by cardiology service.  Please forward your response to P CV DIV PREOP  We plan to set up a virtual telephone visit unless you want to clear the patient now

## 2022-07-21 NOTE — Telephone Encounter (Signed)
Okay to hold plavix. Sorry for the delayed response.

## 2022-07-21 NOTE — Telephone Encounter (Signed)
[facility] called, anxious to cancel procedure if patient not cleared to start holding Plavix on 9/9 for procedure on 9/13. It seems reasonable to me for him to hold Plavix for TIA in 2021 but wanted to verify with you. Thank you.

## 2022-07-22 ENCOUNTER — Other Ambulatory Visit: Payer: Self-pay

## 2022-07-22 ENCOUNTER — Telehealth: Payer: Self-pay | Admitting: *Deleted

## 2022-07-22 ENCOUNTER — Ambulatory Visit: Payer: Medicare Other | Attending: Nurse Practitioner | Admitting: Physician Assistant

## 2022-07-22 DIAGNOSIS — Z01810 Encounter for preprocedural cardiovascular examination: Secondary | ICD-10-CM | POA: Insufficient documentation

## 2022-07-22 DIAGNOSIS — I10 Essential (primary) hypertension: Secondary | ICD-10-CM

## 2022-07-22 DIAGNOSIS — E785 Hyperlipidemia, unspecified: Secondary | ICD-10-CM

## 2022-07-22 DIAGNOSIS — Z79899 Other long term (current) drug therapy: Secondary | ICD-10-CM

## 2022-07-22 MED ORDER — AMLODIPINE BESYLATE 5 MG PO TABS: 5.0000 mg | ORAL_TABLET | Freq: Every day | ORAL | 2 refills | Status: DC

## 2022-07-22 NOTE — Telephone Encounter (Signed)
Pt agreeable to plan of care for tele pre op appt today. Per pre op provider to add pt on to the afternoon schedule for today, due to hold time for Plavix needed. Med rec and consent are done.    I have update the allergy list today with pravastatin as the pt tells me he has myalgias and joint pain.      Patient Consent for Virtual Visit        Bob Elliott has provided verbal consent on 07/22/2022 for a virtual visit (video or telephone).   CONSENT FOR VIRTUAL VISIT FOR:  Bob Elliott  By participating in this virtual visit I agree to the following:  I hereby voluntarily request, consent and authorize [facility] and its employed or contracted physicians, physician assistants, nurse practitioners or other licensed health care professionals (the Practitioner), to provide me with telemedicine health care services (the "Services") as deemed necessary by the treating Practitioner. I acknowledge and consent to receive the Services by the Practitioner via telemedicine. I understand that the telemedicine visit will involve communicating with the Practitioner through live audiovisual communication technology and the disclosure of certain medical information by electronic transmission. I acknowledge that I have been given the opportunity to request an in-person assessment or other available alternative prior to the telemedicine visit and am voluntarily participating in the telemedicine visit.  I understand that I have the right to withhold or withdraw my consent to the use of telemedicine in the course of my care at any time, without affecting my right to future care or treatment, and that the Practitioner or I may terminate the telemedicine visit at any time. I understand that I have the right to inspect all information obtained and/or recorded in the course of the telemedicine visit and may receive copies of available information for a reasonable fee.  I understand that some of the potential  risks of receiving the Services via telemedicine include:  Delay or interruption in medical evaluation due to technological equipment failure or disruption; Information transmitted may not be sufficient (e.g. poor resolution of images) to allow for appropriate medical decision making by the Practitioner; and/or  In rare instances, security protocols could fail, causing a breach of personal health information.  Furthermore, I acknowledge that it is my responsibility to provide information about my medical history, conditions and care that is complete and accurate to the best of my ability. I acknowledge that Practitioner's advice, recommendations, and/or decision may be based on factors not within their control, such as incomplete or inaccurate data provided by me or distortions of diagnostic images or specimens that may result from electronic transmissions. I understand that the practice of medicine is not an exact science and that Practitioner makes no warranties or guarantees regarding treatment outcomes. I acknowledge that a copy of this consent can be made available to me via my patient portal ([facility]), or I can request a printed copy by calling the office of [place].    I understand that my insurance will be billed for this visit.   I have read or had this consent read to me. I understand the contents of this consent, which adequately explains the benefits and risks of the Services being provided via telemedicine.  I have been provided ample opportunity to ask questions regarding this consent and the Services and have had my questions answered to my satisfaction. I give my informed consent for the services to be provided through the use  of telemedicine in my medical care

## 2022-07-22 NOTE — Telephone Encounter (Signed)
Pt agreeable to plan of care for tele pre op appt today. Per pre op provider to add pt on to the afternoon schedule for today, due to hold time for Plavix needed. Med rec and consent are done.      I have update the allergy list today with pravastatin as the pt tells me he has myalgias and joint pain.

## 2022-07-22 NOTE — Telephone Encounter (Signed)
Primary Cardiologist:Heather Renae Fickle, MD   Preoperative team, please contact this patient and set up a phone call appointment for further preoperative risk assessment. Please obtain consent and complete medication review. Thank you for your help.   Per Dr. Johney Frame, patient may hold Plavix for 5 days prior to procedure.    Emmaline Life, NP-C     07/22/2022, 7:04 AM [address] Office [phone] Fax [phone]

## 2022-07-22 NOTE — Progress Notes (Signed)
Virtual Visit via Telephone Note   Because of Bob Elliott's co-morbid illnesses, he is at least at moderate risk for complications without adequate follow up.  This format is felt to be most appropriate for this patient at this time.  The patient did not have access to video technology/had technical difficulties with video requiring transitioning to audio format only (telephone).  All issues noted in this document were discussed and addressed.  No physical exam could be performed with this format.  Please refer to the patient's chart for his consent to telehealth for Bob Elliott.  Evaluation Performed:  Preoperative cardiovascular risk assessment _____________   Date:  07/22/2022   Patient ID:  Bob Elliott, DOB 1941/11/14, MRN [id number] Patient Location:  Home Provider location:   Office  Primary Care Provider:  [name], MD Primary Cardiologist:  Freada Bergeron, MD  Chief Complaint / Patient Profile   81 y.o. 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  Past Medical History    Past Medical History:  Diagnosis Date   Arthritis    BPH (benign prostatic hyperplasia)    CKD (chronic kidney disease), stage II    a. CKD II-III by labs.   Dizziness 10/21/2016   Esophageal dysmotility    Essential hypertension    Trial off acei 08/12/2017 for unexplained sob/ noct smothering    GERD (gastroesophageal reflux disease)    GI bleed    Glaucoma    BOTH EYES   Hyperlipidemia    Hypertension    Inguinal hernia without mention of obstruction or gangrene, unilateral or unspecified, (not specified as recurrent)-right s/p repair with mesh 05/21/2014   Schatzki's ring    Sinus bradycardia    a. prompting dc of Diltiazem (?chronotropic incompetence) in 2017.    Sleep apnea    "mild" no cpap   Thoracic aortic aneurysm (TAA) ([place])    Past Surgical History:  Procedure Laterality Date   COLONOSCOPY WITH PROPOFOL N/A 05/11/2016   Procedure: COLONOSCOPY WITH PROPOFOL;  Surgeon: Garlan Fair, MD;  Location: [facility];  Service: Endoscopy;  Laterality: N/A;   EYE SURGERY     CATARACT R  EYE   HERNIA REPAIR     UMBILICAL   INGUINAL HERNIA REPAIR Right 06/18/2014   Procedure: RIGHT INGUINAL HERNIA REPAIR;  Surgeon: Odis Hollingshead, MD;  Location: [place];  Service: General;  Laterality: Right;   INSERTION OF MESH Right 06/18/2014   Procedure: INSERTION OF MESH;  Surgeon: Odis Hollingshead, MD;  Location: [facility];  Service: General;  Laterality: Right;   NECK SURGERY  2000   cervical disc surgery(limited ROM)- retained hardware   TONSILLECTOMY      Allergies  Allergies  Allergen Reactions   Aspirin Hives, Itching and Nausea Only    Other reaction(s): GI Upset   Codeine Hives, Itching and Nausea Only    Dizzy spells   Pravastatin Sodium Other (See Comments)    PT C/O MYALGIAS AND JOINT PAIN   Shellfish Allergy Hives   Amitriptyline     Other reaction(s): Intolerant   Gabapentin     Other reaction(s): Intolerant   Other     Other reaction(s): stomach upset   Tramadol Hcl     Other reaction(s): grogginess   Zetia [Ezetimibe] Other (See Comments)    Pt  reports causes joint and muscle aches    History of Present Illness    Bob Elliott is a 81 y.o. male who presents via audio/video conferencing for a telehealth visit today.  Pt was last seen in cardiology clinic on 04/23/22 by Dr. Johney Frame.  At that time Bob Elliott was doing well .  The patient is now pending procedure as outlined above. Since his last visit, he has been doing pretty well from a CV standpoint.  He has not had any new shortness of breath or chest pain.  He has a pretty good quality of life and is able to ride a bike 3-4 times a week as well as go [place].  He is able to walk 1-2 blocks, go up and down stairs, and do all of the household activities.  Because of this he is scored a 5.07 METS on his DASI.  This exceeds the 4 METS minimum requirement.   Okay to hold Plavix x 5 days prior to procedure. Restart when medically safe to do so.  Home Medications    Prior to Admission medications   Medication Sig Start Date End Date Taking? Authorizing Provider  amLODipine (NORVASC) 5 MG tablet Take 1 tablet (5 mg total) by mouth daily. 07/22/22   Freada Bergeron, MD  clopidogrel (PLAVIX) 75 MG tablet Take 1 tablet (75 mg total) by mouth daily. 06/04/22   Freada Bergeron, MD  doxazosin (CARDURA) 4 MG tablet Take 4 mg by mouth at bedtime.  02/12/14   [provider]  doxycycline (VIBRA-TABS) 100 MG tablet Take 1 tablet (100 mg total) by mouth 2 (two) times daily. 12/31/21   Trula Slade, DPM  fluticasone (FLONASE) 50 MCG/ACT nasal spray Place 2 sprays into both nostrils daily as needed for allergies or rhinitis.  03/28/14   [provider]  guaiFENesin-dextromethorphan (ROBITUSSIN DM) 100-10 MG/5ML syrup Take 5 mLs by mouth 3 (three) times daily as needed for cough. 06/24/21   Davonna Belling, MD  irbesartan (AVAPRO) 150 MG tablet TAKE 1 TABLET DAILY (DOSE DECREASE) 02/09/22   Freada Bergeron, MD  loratadine (CLARITIN) 10 MG tablet Take 10 mg by mouth daily.    [provider]  mupirocin ointment (BACTROBAN) 2 % Apply 1 application topically 2 (two) times daily. 12/31/21   Trula Slade, DPM  PAXLOVID 20 x 150 MG & 10 x '100MG'$  TBPK Take 3 tablets by mouth 2 (two) times daily. 06/21/21   [provider]  potassium chloride (KLOR-CON) 10 MEQ tablet Take 3 tablets (30 mEq total) by mouth 2 (two) times daily. 10/13/20   Dorothy Spark, MD  pravastatin (PRAVACHOL) 40 MG tablet Take 1 tablet (40 mg total) by mouth every evening. Patient not taking: Reported on 07/22/2022 05/24/22   Freada Bergeron,  MD  predniSONE (DELTASONE) 5 MG tablet Take 10-70 mg by mouth daily. Take 7 tab po on Monday after meal. Take 6 tab po on Tuesday after meal. Take 5 tab po on Wednesday after meal. Take 4 tab po on Thursday after meal. Take 3 tab po on Friday after meal. Take 2 tab po on Saturday after meal. Take 1 tab po on Sunday after meal. Started 06/22/21 End 06/28/21. 06/21/21   [provider]  solifenacin (VESICARE) 5 MG tablet Take 5 mg by mouth daily. 05/23/20   [provider]  spironolactone (ALDACTONE) 25 MG tablet Take 0.5 tablets (12.5 mg total) by mouth  daily. 11/11/21   Freada Bergeron, MD  sucralfate (CARAFATE) 1 GM/10ML suspension Take 10 mLs (1 g total) by mouth 2 (two) times daily. 06/29/21   Pearson Forster, NP  timolol (TIMOPTIC) 0.5 % ophthalmic solution Place 1 drop into both eyes daily.  11/25/17   [provider]    Physical Exam    Vital Signs:  [name] does not have vital signs available for review today.  Given telephonic nature of communication, physical exam is limited. AAOx3. NAD. Normal affect.  Speech and respirations are unlabored.  Accessory Clinical Findings    None  Assessment & Plan    1.  Preoperative Cardiovascular Risk Assessment:  Bob Elliott's perioperative risk of a major cardiac event is 0.4% according to the Revised Cardiac Risk Index (RCRI).  Therefore, he is at low risk for perioperative complications.   His functional capacity is good at 5.07 METs according to the [facility] Activity Status Index (DASI). Recommendations: According to ACC/AHA guidelines, no further cardiovascular testing needed.  The patient may proceed to surgery at acceptable risk.    Antiplatelet and/or Anticoagulation Recommendations: Clopidogrel (Plavix) can be held for 5 days prior to his surgery and resumed as soon as possible post op.    A copy of this note will be routed to requesting surgeon.  Time:   Today, I have spent 7 minutes with the patient  with telehealth technology discussing medical history, symptoms, and management plan.     Elgie Collard, PA-C  07/22/2022, 9:02 AM

## 2022-07-28 DIAGNOSIS — K222 Esophageal obstruction: Secondary | ICD-10-CM | POA: Diagnosis not present

## 2022-07-28 DIAGNOSIS — K5710 Diverticulosis of small intestine without perforation or abscess without bleeding: Secondary | ICD-10-CM | POA: Diagnosis not present

## 2022-07-28 DIAGNOSIS — K317 Polyp of stomach and duodenum: Secondary | ICD-10-CM | POA: Diagnosis not present

## 2022-07-28 DIAGNOSIS — R1310 Dysphagia, unspecified: Secondary | ICD-10-CM | POA: Diagnosis not present

## 2022-07-28 DIAGNOSIS — K219 Gastro-esophageal reflux disease without esophagitis: Secondary | ICD-10-CM | POA: Diagnosis not present

## 2022-07-28 DIAGNOSIS — K2930 Chronic superficial gastritis without bleeding: Secondary | ICD-10-CM | POA: Diagnosis not present

## 2022-07-28 DIAGNOSIS — K2289 Other specified disease of esophagus: Secondary | ICD-10-CM | POA: Diagnosis not present

## 2022-07-28 DIAGNOSIS — K2940 Chronic atrophic gastritis without bleeding: Secondary | ICD-10-CM | POA: Diagnosis not present

## 2022-07-28 DIAGNOSIS — Q396 Congenital diverticulum of esophagus: Secondary | ICD-10-CM | POA: Diagnosis not present

## 2022-07-30 DIAGNOSIS — K219 Gastro-esophageal reflux disease without esophagitis: Secondary | ICD-10-CM | POA: Diagnosis not present

## 2022-07-30 DIAGNOSIS — K2930 Chronic superficial gastritis without bleeding: Secondary | ICD-10-CM | POA: Diagnosis not present

## 2022-08-05 DIAGNOSIS — Z23 Encounter for immunization: Secondary | ICD-10-CM | POA: Diagnosis not present

## 2022-08-05 DIAGNOSIS — R21 Rash and other nonspecific skin eruption: Secondary | ICD-10-CM | POA: Diagnosis not present

## 2022-08-05 DIAGNOSIS — L821 Other seborrheic keratosis: Secondary | ICD-10-CM | POA: Diagnosis not present

## 2022-08-21 DIAGNOSIS — Z23 Encounter for immunization: Secondary | ICD-10-CM | POA: Diagnosis not present

## 2022-10-12 NOTE — Progress Notes (Unsigned)
Cardiology Office Note:    Date:  10/14/2022   ID:  Bob Elliott, DOB 09-Sep-1941, MRN [id number]  PCP:  [name], MD   [facility] Providers Cardiologist:  Freada Bergeron, MD {  Referring MD: [name], MD    History of Present Illness:    Bob Elliott is a 81 y.o. 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 Meda Coffee who now returns to clinic for CV follow-up.  Per review of the record, the patient has a history of amaurosis fugax and underwent cardiac monitor which showed SB-NSR and short runs NSVT 4 and 9 beats and no Afib. On 02/20/20, the patient was having DOE. Myoview normal LV function and no ischemia. Patient intolerant to ASA so started on Plavix b/c of recent TIA.  Saw Dr. Meda Coffee on 10/28/20, he was doing well. Continued to have mild DOE. No HF symptoms. Has been following with Dr. Orvan Seen for ascending aortic aneurysm, which was stable at 18m on last CTA in 11/2020. Myoview 02/26/20 with normal EF, no evidence of ischemia.   Was last seen in clinic 10/2021 where he was doing well from CV standpoint. Stopped lipitor due to knee pain but was willing to start zetia. RLE doppler obtained due to RLE edema which showed no evidence of DVT.  Was last seen on 07/22/22 by TNicholes Roughfor pre-op evaluation prior to endoscopy. Was doing well at that time.   On CT chest w/o 01/04/22: AAA found to be 4.5 cm with minimal change in the aorta at the sinuses of Valsalva, 4.1 cm compared with prior study 4.0 cm  Today, he reports that the DOE remains fairly unchanged, cannot get up steps very well. He stops for a few seconds and collect himself. Feels as if he experiences some orthopnea, unchanged from previous. Feels if he lays down too quickly, he has a "funny feeling" in his stomach but takes deep breaths and feels better.   Usually, blood pressures are 120s-130s/80s  at home, checks them regularly.   Past Medical History:  Diagnosis Date   Arthritis    BPH (benign prostatic hyperplasia)    CKD (chronic kidney disease), stage II    a. CKD II-III by labs.   Dizziness 10/21/2016   Esophageal dysmotility    Essential hypertension    Trial off acei 08/12/2017 for unexplained sob/ noct smothering    GERD (gastroesophageal reflux disease)    GI bleed    Glaucoma    BOTH EYES   Hyperlipidemia    Hypertension    Inguinal hernia without mention of obstruction or gangrene, unilateral or unspecified, (not specified as recurrent)-right s/p repair with mesh 05/21/2014   Schatzki's ring    Sinus bradycardia    a. prompting dc of Diltiazem (?chronotropic incompetence) in 2017.   Sleep apnea    "mild" no cpap   [place]     Past Surgical History:  Procedure Laterality Date   COLONOSCOPY WITH PROPOFOL N/A 05/11/2016   Procedure: COLONOSCOPY WITH PROPOFOL;  Surgeon: MGarlan Fair MD;  [facility];  Service: Endoscopy;  Laterality: N/A;   EYE SURGERY     CATARACT R  EYE   HERNIA REPAIR     UMBILICAL   INGUINAL HERNIA REPAIR Right 06/18/2014   Procedure: RIGHT INGUINAL HERNIA REPAIR;  Surgeon: TOdis Hollingshead MD;  [place]  Service: General;  Laterality: Right;   INSERTION OF MESH Right 06/18/2014   Procedure: INSERTION OF MESH;  Surgeon: Odis Hollingshead, MD;  Location: [place];  Service: General;  Laterality: Right;   NECK SURGERY  2000   cervical disc surgery(limited ROM)- retained hardware   TONSILLECTOMY      Current Medications: Current Meds  Medication Sig   amLODipine (NORVASC) 5 MG tablet Take 1 tablet (5 mg total) by mouth daily.   clopidogrel (PLAVIX) 75 MG tablet Take 1 tablet (75 mg total) by mouth daily.   clotrimazole-betamethasone (LOTRISONE) cream Apply 1 Application topically 2 (two) times daily.   doxazosin (CARDURA) 4 MG tablet Take 4 mg by mouth at bedtime.    esomeprazole (NEXIUM) 40  MG capsule Take 40 mg by mouth 2 (two) times daily.   fluticasone (FLONASE) 50 MCG/ACT nasal spray Place 2 sprays into both nostrils daily as needed for allergies or rhinitis.    guaiFENesin-dextromethorphan (ROBITUSSIN DM) 100-10 MG/5ML syrup Take 5 mLs by mouth 3 (three) times daily as needed for cough.   irbesartan (AVAPRO) 150 MG tablet TAKE 1 TABLET DAILY (DOSE DECREASE)   loratadine (CLARITIN) 10 MG tablet Take 10 mg by mouth daily.   potassium chloride (KLOR-CON) 10 MEQ tablet Take 3 tablets (30 mEq total) by mouth 2 (two) times daily.   potassium chloride (MICRO-K) 10 MEQ CR capsule Take by mouth.   pravastatin (PRAVACHOL) 40 MG tablet Take 1 tablet (40 mg total) by mouth every evening.   solifenacin (VESICARE) 5 MG tablet Take 5 mg by mouth as needed.   spironolactone (ALDACTONE) 25 MG tablet Take 0.5 tablets (12.5 mg total) by mouth daily.   timolol (TIMOPTIC) 0.5 % ophthalmic solution Place 1 drop into both eyes daily.      Allergies:   Aspirin, Codeine, Pravastatin sodium, Shellfish allergy, Amitriptyline, Gabapentin, Other, Tramadol hcl, and Zetia [ezetimibe]   Social History   Socioeconomic History   Marital status: Married    Spouse name: Not on file   Number of children: 4   Years of education: Not on file   Highest education level: Not on file  Occupational History   Occupation: retired  Tobacco Use   Smoking status: Never   Smokeless tobacco: Never  Vaping Use   Vaping Use: Never used  Substance and Sexual Activity   Alcohol use: Yes    Comment: none since 80's   Drug use: No   Sexual activity: Not Currently  Other Topics Concern   Not on file  Social History Narrative   Not on file   Social Determinants of Health   Financial Resource Strain: Not on file  Food Insecurity: Not on file  Transportation Needs: Not on file  Physical Activity: Not on file  Stress: Not on file  Social Connections: Not on file     Family History: The patient's family  history includes Cancer in his brother; Diabetes in his sister; Heart disease in his mother; Leukemia in his maternal grandmother; Stroke in his father.  ROS:   Please see the history of present illness.    Review of Systems  Constitutional:  Negative for chills and fever.  HENT:  Negative for hearing loss.   Eyes:  Negative for blurred vision.  Respiratory:  Positive for shortness of breath.   Cardiovascular:  Negative for chest pain, palpitations, orthopnea, claudication, leg swelling and PND.  Gastrointestinal:  Negative for melena.  Genitourinary:  Negative for dysuria and hematuria.  Musculoskeletal:  Positive for back pain, joint pain and myalgias.  Neurological:  Negative for loss of consciousness.  Psychiatric/Behavioral:  Negative for substance abuse.     EKGs/Labs/Other Studies Reviewed:    The following studies were reviewed today: Myoview 02/26/20: Nuclear stress EF: 58%. The left ventricular ejection fraction is normal (55-65%). There was no ST segment deviation noted during stress. This is a low risk study.   Apical thinning not thought to be significant No ischemia EF 58%  Cardiac Monitor 11/2019:  Sinus bradycardia to sinus rhythm. Two very short runs of nsVT - 4 and 9 beats.   Sinus bradycardia to sinus rhythm. Two very short runs of nsVT - 4 and 9 beats. No atrial fibrillation.  TTE 09/2019: IMPRESSIONS   1. Left ventricular ejection fraction, by visual estimation, is 60 to  65%. The left ventricle has normal function. There is mildly increased  left ventricular hypertrophy.   2. Left ventricular diastolic parameters are consistent with Grade I  diastolic dysfunction (impaired relaxation).   3. The left ventricle has no regional wall motion abnormalities.   4. Global right ventricle has normal systolic function.The right  ventricular size is normal. No increase in right ventricular wall  thickness.   5. Left atrial size was normal.   6. Right atrial  size was normal.   7. The mitral valve is normal in structure. No evidence of mitral valve  regurgitation. No evidence of mitral stenosis.   8. The tricuspid valve is normal in structure. Tricuspid valve  regurgitation is not demonstrated.   9. The aortic valve is tricuspid. Aortic valve regurgitation is mild. No  evidence of aortic valve sclerosis or stenosis.  10. There is mild dilatation of the aortic root measuring 41 mm.  11. TR signal is inadequate for assessing pulmonary artery systolic  pressure.  12. The inferior vena cava is normal in size with greater than 50%  respiratory variability, suggesting right atrial pressure of 3 mmHg.    Carotid US 09/20/2019 Summary: Right Carotid: Velocities in the right ICA are consistent with a 1-39% stenosis. Left Carotid: Velocities in the left ICA are consistent with a 1-39% stenosis. Vertebrals: Bilateral vertebral arteries demonstrate antegrade flow.   Chest CTA 03/26/2019 IMPRESSION: 1. No significant change in caliber of the thoracic aorta, measuring approximately 4.1 x 4.0 cm in the tubular ascending thoracic aorta, previously 4.3 x 4.2 cm when measured similarly. The aortic root is normal in caliber. The sinuses of Valsalva are enlarged up to 4.3 cm. The distal aortic arch and descending thoracic aorta are normal in caliber. Minimal atherosclerosis.   Chest CTA 05/2020 Interval increase in size of known thoracic aortic aneurysm now measuring up to 4.6 cm (previously measured 4.0 x 4.0 cm). Greater than 5 mm growth over the past 12 months is associated with an increased risk of aneurysm rupture. 2. Diffuse bilateral bronchial wall thickening, consistent with nonspecific infectious or inflammatory bronchitis.   Myoview 09/06/16 Nuclear stress EF: 53%. Blood pressure demonstrated a normal response to exercise. There was no ST segment deviation noted during stress. This is a low risk study. The left ventricular ejection fraction  is mildly decreased (45-54%). Inferior and apical thinning consistent with soft tissue attenuaion (diaphragm), cannot exclude small subendocardial scar No ichemia   Echocardiogram 09/06/16 Mild septal hypertrophy with mod post wall hypertrophy, EF 55-60, no RWMA, Gr 1 DD, mild AI, trivial MR, mild RAE, GLS -17.2%  CT chest w/o 01/04/22: AAA found to be 4.5 cm with minimal  change in the aorta at the sinuses of Valsalva, 4.1 cm compared with prior study 4.0 cm  EKG:  SR, PACs, 1st degree AV block  Recent Labs: No results found for requested labs within last 365 days.  Recent Lipid Panel    Component Value Date/Time   CHOL 171 04/23/2022 0752   TRIG 100 04/23/2022 0752   HDL 51 04/23/2022 0752   CHOLHDL 3.4 04/23/2022 0752   CHOLHDL 4.0 09/20/2019 0740   VLDL 19 09/20/2019 0740   LDLCALC 102 (H) 04/23/2022 0752      Physical Exam:    VS:  BP 126/78   Pulse 63   Ht '5\' 8"'$  (1.727 m)   Wt 211 lb (95.7 kg)   SpO2 95%   BMI 32.08 kg/m     Wt Readings from Last 3 Encounters:  10/14/22 211 lb (95.7 kg)  04/23/22 208 lb 3.2 oz (94.4 kg)  01/04/22 210 lb (95.3 kg)     GEN:  Comfortable, NAD HEENT: Normal NECK: No JVD; No carotid bruits CARDIAC: Regular rate with irregular rhythm, soft systolic murmur RUSB. No rubs, gallops RESPIRATORY:  Clear to auscultation without rales, wheezing or rhonchi  ABDOMEN: Soft, non-tender, non-distended MUSCULOSKELETAL: BLE without edema SKIN: Warm and dry NEUROLOGIC:  Alert and oriented x 3 PSYCHIATRIC:  Normal affect   ASSESSMENT:    1. Amaurosis fugax   2. DOE (dyspnea on exertion)   3. Essential hypertension     PLAN:    In order of problems listed above:  #DOE: Stable. Reassuring cardiac work-up. Myoview negative for ischemia or infarction; normal LVEF. Suspect secondary to deconditioning given reassuring CV workup. Seems to be able to continue with ADLs without much difficulty.  -Negative ischemic work-up -Discussed importance  of continued exercise to help with conditioning with goal 157mn moderate intensity exercise per week  #Thoracic Aortic Aneurysm: Measured 4.1cm on 03/2019, 4.6cm on 05/2020, 4.3cm on 11/2020, 4.5cm 12/2021. Follows with Dr. VDarcey Nora Unable to tolerate lipitor secondary to myalgias. Attempted Zetia and unable to tolerate this as well. [facility] LDL 04/2022 102.  -Continue irbesartan '150mg'$  daily -Unable to tolerate ASA; on plavix -Follow-up with Dr. VDarcey Noraas scheduled -Lipid clinic for PCSK9  #HTN: Stable, normotensive. UTD on BMP, potassium and kidney function appropriate.  -Yearly BMP  -Continue amlodipine to '5mg'$  daily -Continue irbesartan '150mg'$  daily -Continue spironolactone 12.'5mg'$  daily  #HLD: #Statin Intolerance: Stopped statin due to joint pain and does not want to re-trial. Also stopped Zetia secondary to side effects. Ideally, goal would be <70 given history of amaurosis fugax.  Willing to trial PCSK9i. -Continue with lipid clinic for PCSK9i -Off statin due to joint pain; does not want to re-trial -Continue lifestyle modifications with diet and increased exercise  #Amaurosis Fugax: 30day monitor without Afib. On plavix. -On plavix '75mg'$  daily -Continue with lipid clinic as above; did not tolerate statins due to joint pain--does not want to retrial -Intolerant of ASA  #PACs Asymptomatic and stable.   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 -Repeat TTE for monitoring  #Dysphagia:  Prevents ability to take potassium supplementation. Otherwise, taking medications well. Follows SLP. Consider potassium packets for ease of swallowing.  -Switch to packet form  -Follow SLP   Medication Adjustments/Labs and Tests Ordered: Current medicines are reviewed at length with the patient today.  Concerns regarding medicines are outlined above.  No orders of the defined types were placed in this encounter.  No orders of the defined types were placed in  this  encounter.   There are no Patient Instructions on file for this visit.     Signed, Erskine Emery, MD  10/14/2022 1:56 PM    [place]

## 2022-10-14 ENCOUNTER — Encounter: Payer: Self-pay | Admitting: Cardiology

## 2022-10-14 ENCOUNTER — Ambulatory Visit: Payer: Medicare Other | Attending: Cardiology | Admitting: Cardiology

## 2022-10-14 VITALS — BP 126/78 | HR 63 | Ht 68.0 in | Wt 211.0 lb

## 2022-10-14 DIAGNOSIS — I7123 Aneurysm of the descending thoracic aorta, without rupture: Secondary | ICD-10-CM | POA: Insufficient documentation

## 2022-10-14 DIAGNOSIS — I10 Essential (primary) hypertension: Secondary | ICD-10-CM | POA: Diagnosis not present

## 2022-10-14 DIAGNOSIS — G453 Amaurosis fugax: Secondary | ICD-10-CM | POA: Insufficient documentation

## 2022-10-14 DIAGNOSIS — R0609 Other forms of dyspnea: Secondary | ICD-10-CM | POA: Diagnosis not present

## 2022-10-14 DIAGNOSIS — E785 Hyperlipidemia, unspecified: Secondary | ICD-10-CM | POA: Insufficient documentation

## 2022-10-14 DIAGNOSIS — I7121 Aneurysm of the ascending aorta, without rupture: Secondary | ICD-10-CM | POA: Diagnosis not present

## 2022-10-14 NOTE — Patient Instructions (Addendum)
Medication Instructions:  No changes  *If you need a refill on your cardiac medications before your next appointment, please call your pharmacy*   Lab Work: NONE If you have labs (blood work) drawn today and your tests are completely normal, you will receive your results only by: [place] (if you have MyChart) OR A paper copy in the mail If you have any lab test that is abnormal or we need to change your treatment, we will call you to review the results.   Testing/Procedures: Your physician has requested that you have an echocardiogram. Echocardiography is a painless test that uses sound waves to create images of your heart. It provides your doctor with information about the size and shape of your heart and how well your heart's chambers and valves are working. This procedure takes approximately one hour. There are no restrictions for this procedure. Please do NOT wear cologne, perfume, aftershave, or lotions (deodorant is allowed). Please arrive 15 minutes prior to your appointment time.   Follow-Up: At [facility], you and your health needs are our priority.  As part of our continuing mission to provide you with exceptional heart care, we have created designated Provider Care Teams.  These Care Teams include your primary Cardiologist (physician) and Advanced Practice Providers (APPs -  Physician Assistants and Nurse Practitioners) who all work together to provide you with the care you need, when you need it.  We recommend signing up for the patient portal called "MyChart".  Sign up information is provided on this After Visit Summary.  MyChart is used to connect with patients for Virtual Visits (Telemedicine).  Patients are able to view lab/test results, encounter notes, upcoming appointments, etc.  Non-urgent messages can be sent to your provider as well.   To learn more about what you can do with MyChart, go to [email or web address].    Your next appointment:   6  month(s)  The format for your next appointment:   In Person  Provider:  DR Johney Frame   Other Instructions REFERRAL TO [facility] WITH DR VAN TRIGT   Important Information About Sugar

## 2022-10-18 DIAGNOSIS — I10 Essential (primary) hypertension: Secondary | ICD-10-CM | POA: Diagnosis not present

## 2022-10-18 DIAGNOSIS — K219 Gastro-esophageal reflux disease without esophagitis: Secondary | ICD-10-CM | POA: Diagnosis not present

## 2022-10-18 DIAGNOSIS — G720 Drug-induced myopathy: Secondary | ICD-10-CM | POA: Diagnosis not present

## 2022-10-18 DIAGNOSIS — I719 Aortic aneurysm of unspecified site, without rupture: Secondary | ICD-10-CM | POA: Diagnosis not present

## 2022-10-18 DIAGNOSIS — I700 Atherosclerosis of aorta: Secondary | ICD-10-CM | POA: Diagnosis not present

## 2022-10-18 DIAGNOSIS — N1831 Chronic kidney disease, stage 3a: Secondary | ICD-10-CM | POA: Diagnosis not present

## 2022-10-18 DIAGNOSIS — E782 Mixed hyperlipidemia: Secondary | ICD-10-CM | POA: Diagnosis not present

## 2022-10-18 DIAGNOSIS — I712 Thoracic aortic aneurysm, without rupture, unspecified: Secondary | ICD-10-CM | POA: Diagnosis not present

## 2022-10-18 DIAGNOSIS — M109 Gout, unspecified: Secondary | ICD-10-CM | POA: Diagnosis not present

## 2022-10-18 DIAGNOSIS — R7303 Prediabetes: Secondary | ICD-10-CM | POA: Diagnosis not present

## 2022-11-02 ENCOUNTER — Other Ambulatory Visit: Payer: Self-pay

## 2022-11-02 MED ORDER — IRBESARTAN 150 MG PO TABS: ORAL_TABLET | ORAL | 3 refills | Status: DC

## 2022-11-04 ENCOUNTER — Ambulatory Visit (HOSPITAL_COMMUNITY): Payer: Medicare Other | Attending: Cardiology

## 2022-11-04 DIAGNOSIS — R0609 Other forms of dyspnea: Secondary | ICD-10-CM | POA: Insufficient documentation

## 2022-11-04 LAB — ECHOCARDIOGRAM COMPLETE
AV Vena cont: 0.3 cm
Area-P 1/2: 3.12 cm2
P 1/2 time: 596 msec
S' Lateral: 2.8 cm

## 2022-11-24 ENCOUNTER — Other Ambulatory Visit: Payer: Self-pay | Admitting: Cardiothoracic Surgery

## 2022-11-24 ENCOUNTER — Ambulatory Visit: Payer: Medicare Other

## 2022-11-24 DIAGNOSIS — H401131 Primary open-angle glaucoma, bilateral, mild stage: Secondary | ICD-10-CM | POA: Diagnosis not present

## 2022-11-24 DIAGNOSIS — I712 Thoracic aortic aneurysm, without rupture, unspecified: Secondary | ICD-10-CM

## 2022-11-26 ENCOUNTER — Ambulatory Visit: Payer: Medicare Other | Attending: Cardiovascular Disease | Admitting: Pharmacist

## 2022-11-26 DIAGNOSIS — E782 Mixed hyperlipidemia: Secondary | ICD-10-CM | POA: Insufficient documentation

## 2022-11-26 NOTE — Patient Instructions (Signed)
Your LDL cholesterol goal is < 70  We'll check your cholesterol today and I'll call you Monday to discuss the results  We can try a really low dose/frequency of Crestor (rosuvastatin) or a twice monthly injection called Repatha depending on how your numbers look

## 2022-11-26 NOTE — Progress Notes (Unsigned)
Patient ID: Bob Elliott                 DOB: Nov 11, 1941                    MRN: [id number]     HPI: Bob Elliott is a 82 y.o. male patient referred to lipid clinic by Dr Johney Frame due to history of statin intolerance. PMH is significant for thoracic aortic aneurysm, HTN, HLD, amaurosis fugax, TIA, and CKD. EF 58% on 02/2020 myoview. Carotid ultrasound 09/2019 with 1-39% bilateral carotid stenosis.  Pt presents today for lipid management. Not currently taking anything for cholesterol. Reports joint and muscle pain on multiple statins and ezetimibe. Interested in rechecking his cholesterol today since it's been a while. He's been trying to stay active and work on dietary improvements. Has commercial insurance still, info below:  BIN [id number] PCN [id number] GRP AYXA ID [id number]  Current Medications: none Intolerances: pravastatin '40mg'$  daily, atorvastatin 10-'40mg'$  daily, ezetimibe '10mg'$  daily - joint pain and myalgias Risk Factors: carotid stenosis, TIA, HTN, age LDL goal: '70mg'$ /dL  Diet: more oatmeal, less fried food.   Exercise: exercise bike for 15 mins 3-4x a week, walks a bit too  Family History: Cancer in his brother; Diabetes in his sister; Heart disease in his mother; Leukemia in his maternal grandmother; Stroke in his father.   Social History: No tobacco, alcohol or drug use.  Labs: 04/23/22: TC 171, TG 100, HDL 51, LDL 102 (no LLT)  Past Medical History:  Diagnosis Date   Arthritis    BPH (benign prostatic hyperplasia)    CKD (chronic kidney disease), stage II    a. CKD II-III by labs.   Dizziness 10/21/2016   Esophageal dysmotility    Essential hypertension    Trial off acei 08/12/2017 for unexplained sob/ noct smothering    GERD (gastroesophageal reflux disease)    GI bleed    Glaucoma    BOTH EYES   Hyperlipidemia    Hypertension    Inguinal hernia without mention of obstruction or gangrene, unilateral or unspecified, (not specified as recurrent)-right s/p repair with mesh  05/21/2014   Schatzki's ring    Sinus bradycardia    a. prompting dc of Diltiazem (?chronotropic incompetence) in 2017.   Sleep apnea    "mild" no cpap   Thoracic aortic aneurysm (TAA) (HCC)     Current Outpatient Medications on File Prior to Visit  Medication Sig Dispense Refill   amLODipine (NORVASC) 5 MG tablet Take 1 tablet (5 mg total) by mouth daily. 90 tablet 2   clopidogrel (PLAVIX) 75 MG tablet Take 1 tablet (75 mg total) by mouth daily. 90 tablet 3   clotrimazole-betamethasone (LOTRISONE) cream Apply 1 Application topically 2 (two) times daily.     doxazosin (CARDURA) 4 MG tablet Take 4 mg by mouth at bedtime.      doxycycline (VIBRA-TABS) 100 MG tablet Take 1 tablet (100 mg total) by mouth 2 (two) times daily. (Patient not taking: Reported on 10/14/2022) 20 tablet 0   esomeprazole (NEXIUM) 40 MG capsule Take 40 mg by mouth 2 (two) times daily.     fluticasone (FLONASE) 50 MCG/ACT nasal spray Place 2 sprays into both nostrils daily as needed for allergies or rhinitis.      guaiFENesin-dextromethorphan (ROBITUSSIN DM) 100-10 MG/5ML syrup Take 5 mLs by mouth 3 (three) times daily as needed for cough. 118 mL 0   irbesartan (AVAPRO) 150 MG tablet TAKE 1 TABLET DAILY (DOSE DECREASE)  90 tablet 3   loratadine (CLARITIN) 10 MG tablet Take 10 mg by mouth daily.     mupirocin ointment (BACTROBAN) 2 % Apply 1 application topically 2 (two) times daily. (Patient not taking: Reported on 10/14/2022) 30 g 0   PAXLOVID 20 x 150 MG & 10 x '100MG'$  TBPK Take 3 tablets by mouth 2 (two) times daily. (Patient not taking: Reported on 10/14/2022)     potassium chloride (KLOR-CON) 10 MEQ tablet Take 3 tablets (30 mEq total) by mouth 2 (two) times daily. 540 tablet 1   potassium chloride (MICRO-K) 10 MEQ CR capsule Take by mouth.     pravastatin (PRAVACHOL) 40 MG tablet Take 1 tablet (40 mg total) by mouth every evening. 30 tablet 11   predniSONE (DELTASONE) 5 MG tablet Take 10-70 mg by mouth daily. Take 7 tab  po on Monday after meal. Take 6 tab po on Tuesday after meal. Take 5 tab po on Wednesday after meal. Take 4 tab po on Thursday after meal. Take 3 tab po on Friday after meal. Take 2 tab po on Saturday after meal. Take 1 tab po on Sunday after meal. Started 06/22/21 End 06/28/21. (Patient not taking: Reported on 10/14/2022)     solifenacin (VESICARE) 5 MG tablet Take 5 mg by mouth as needed.     spironolactone (ALDACTONE) 25 MG tablet Take 0.5 tablets (12.5 mg total) by mouth daily. 45 tablet 3   sucralfate (CARAFATE) 1 GM/10ML suspension Take 10 mLs (1 g total) by mouth 2 (two) times daily. (Patient not taking: Reported on 10/14/2022) 420 mL 0   timolol (TIMOPTIC) 0.5 % ophthalmic solution Place 1 drop into both eyes daily.   4   No current facility-administered medications on file prior to visit.    Allergies  Allergen Reactions   Aspirin Hives, Itching and Nausea Only    Other reaction(s): GI Upset   Codeine Hives, Itching, Nausea Only and Other (See Comments)    Dizzy spells   Pravastatin Sodium Other (See Comments)    PT C/O MYALGIAS AND JOINT PAIN   Shellfish Allergy Hives   Amitriptyline     Other reaction(s): Intolerant   Gabapentin     Other reaction(s): Intolerant   Other     Other reaction(s): stomach upset   Tramadol Hcl     Other reaction(s): grogginess   Zetia [Ezetimibe] Other (See Comments)    Pt reports causes joint and muscle aches    Assessment/Plan:  1. Hyperlipidemia - LDL goal < 70. LDL was 102 last year, rechecking updated baseline today per pt request. Pt intolerant to 2 statins and ezetimibe. Discussed statin rechallenge and PCSK9i today. Pt ok with idea of injection but doesn't want to stay on it long term. Depending on how far away from his LDL goal he is on updated labs today, will consider either low dose rosuvastatin '5mg'$  3x/week or Repatha. Will call pt on Monday with lab results to discuss.  Tonetta Napoles E. Velia Pamer, PharmD, BCACP, [facility]. [address] [place], [place] [address] Phone: [phone]; Fax: [phone] 11/26/2022 1:59 PM

## 2022-11-27 LAB — LIPID PANEL
Chol/HDL Ratio: 4.2 ratio (ref 0.0–5.0)
Cholesterol, Total: 182 mg/dL (ref 100–199)
HDL: 43 mg/dL (ref >39)
LDL Chol Calc (NIH): 100 mg/dL — ABNORMAL HIGH (ref 0–99)
Triglycerides: 226 mg/dL — ABNORMAL HIGH (ref 0–149)
VLDL Cholesterol Cal: 39 mg/dL (ref 5–40)

## 2022-11-29 ENCOUNTER — Telehealth: Payer: Self-pay | Admitting: Pharmacist

## 2022-11-29 DIAGNOSIS — E782 Mixed hyperlipidemia: Secondary | ICD-10-CM

## 2022-11-29 MED ORDER — ROSUVASTATIN CALCIUM 5 MG PO TABS: ORAL_TABLET | ORAL | 11 refills | Status: DC

## 2022-11-29 NOTE — Telephone Encounter (Signed)
Baseline LDL 100 above goal < 70. Had discussed rechallenge with low dose rosuvastatin last week vs PCSK9i. Pt willing to try rosuvastatin '5mg'$  3x per week. Will recheck labs in May, same day he sees Dr Johney Frame for follow up. Advised pt to call clinic with any tolerability issues.

## 2022-12-01 ENCOUNTER — Other Ambulatory Visit: Payer: Self-pay

## 2022-12-01 MED ORDER — SPIRONOLACTONE 25 MG PO TABS: 12.5000 mg | ORAL_TABLET | Freq: Every day | ORAL | 3 refills | Status: DC

## 2022-12-28 ENCOUNTER — Other Ambulatory Visit: Payer: Self-pay

## 2022-12-28 DIAGNOSIS — E785 Hyperlipidemia, unspecified: Secondary | ICD-10-CM

## 2022-12-28 DIAGNOSIS — Z79899 Other long term (current) drug therapy: Secondary | ICD-10-CM

## 2022-12-28 DIAGNOSIS — I10 Essential (primary) hypertension: Secondary | ICD-10-CM

## 2022-12-28 MED ORDER — AMLODIPINE BESYLATE 5 MG PO TABS: 5.0000 mg | ORAL_TABLET | Freq: Every day | ORAL | 3 refills | Status: DC

## 2022-12-30 ENCOUNTER — Telehealth: Payer: Self-pay | Admitting: Cardiology

## 2022-12-30 MED ORDER — REPATHA SURECLICK 140 MG/ML ~~LOC~~ SOAJ: 1.0000 | SUBCUTANEOUS | 3 refills | Status: DC

## 2022-12-30 NOTE — Telephone Encounter (Signed)
Pt c/o medication issue:  1. Name of Medication: rosuvastatin (CRESTOR) 5 MG tablet   2. How are you currently taking this medication (dosage and times per day)? Take 1 tablet by mouth 3 days a week or as tolerated.   3. Are you having a reaction (difficulty breathing--STAT)? Yes, SOB, Fatigue, and didn't feel well overall  4. What is your medication issue? [facility] nurse case manager called because the pt called them about the issue he was having with this medication. Per Sharyn Lull, pt stopped taking Rosuvastatin, because he is experiencing sob, fatigue, and didn't feel well. Sharyn Lull requested that we call the pt directly for recommendation.

## 2022-12-30 NOTE — Telephone Encounter (Signed)
Pt now intolerant to rosuvastatin 68m 3x per week in addition to pravastatin 458mdaily, atorvastatin 10-40mg daily, and ezetimibe 1024maily.  Submitted prior authorization for Repatha, key [id number] message that drug is covered by plan. Rx sent to pharmacy. Pt will qualify for $5 copay card but will need to call in to [employer] to get signed up for it (since he's > 65 the website application won't work), he is aware. His daughter is an RN Therapist, sportsd can help with injections. Will keep f/u labs scheduled in May to assess efficacy of Repatha.

## 2022-12-30 NOTE — Telephone Encounter (Signed)
Called the pt.  Pt states [facility]in[place] PharmD in lipid clinic restarted him back low dose crestor about a month ago, and he tried taking this, but had to stop due to complaints of feeling more fatigued and achy all over.   Pt states he was having trouble getting out of the bed on crestor.  He states this caused extreme muscle aches.   He states he stopped taking this and feels 100% better.   Pt aware that I will send this message to Fuller [place] PharmD to inform her of this, so that she can follow up with him, to further discuss PCSK9-Inhibitors.   Pt verbalized understanding and agrees with this plan.

## 2023-01-03 ENCOUNTER — Ambulatory Visit (INDEPENDENT_AMBULATORY_CARE_PROVIDER_SITE_OTHER): Payer: Medicare Other | Admitting: Cardiothoracic Surgery

## 2023-01-03 ENCOUNTER — Ambulatory Visit
Admission: RE | Admit: 2023-01-03 | Discharge: 2023-01-03 | Disposition: A | Discharge status: Home / Self Care | Payer: Medicare Other | Source: Ambulatory Visit | Attending: Cardiothoracic Surgery | Admitting: Cardiothoracic Surgery

## 2023-01-03 ENCOUNTER — Encounter: Payer: Self-pay | Admitting: Cardiothoracic Surgery

## 2023-01-03 VITALS — BP 135/80 | HR 63 | Resp 20 | Ht 68.0 in | Wt 208.0 lb

## 2023-01-03 DIAGNOSIS — I7123 Aneurysm of the descending thoracic aorta, without rupture: Secondary | ICD-10-CM | POA: Diagnosis not present

## 2023-01-03 DIAGNOSIS — I7121 Aneurysm of the ascending aorta, without rupture: Secondary | ICD-10-CM | POA: Diagnosis not present

## 2023-01-03 DIAGNOSIS — Q341 Congenital cyst of mediastinum: Secondary | ICD-10-CM

## 2023-01-03 DIAGNOSIS — I712 Thoracic aortic aneurysm, without rupture, unspecified: Secondary | ICD-10-CM

## 2023-01-03 NOTE — Progress Notes (Signed)
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  This has not changed since 2019 and I reviewed the images of the scan performed today which demonstrate the mid ascending thoracic aorta to measure 4.5 cm in diameter.  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  Patient denies any chest pain shortness of breath or symptoms of CHF. He was recently evaluated in the cardiology office for medical clearance prior to esophageal dilatation for his Schatzki's ring with some causes some persistent dysphagia.  Patient is compliant with his blood pressure medication and keeps a check on his blood pressure understanding that high blood pressure could result in expansion of his thoracic aortic aneurysm.  Current Outpatient Medications  Medication Sig Dispense Refill   amLODipine (NORVASC) 5 MG tablet Take 1 tablet (5 mg total) by mouth daily. 90 tablet 3   clopidogrel (PLAVIX) 75 MG tablet Take 1 tablet (75 mg total) by mouth daily. 90 tablet 3   clotrimazole-betamethasone (LOTRISONE) cream Apply 1 Application topically 2 (two) times daily.     doxazosin (CARDURA) 4 MG tablet Take 4 mg by mouth at bedtime.      esomeprazole (NEXIUM) 40 MG capsule Take 40 mg by mouth 2 (two) times daily.     Evolocumab (REPATHA SURECLICK) [id number] MG/ML SOAJ Inject 140 mg into the skin every 14 (fourteen) days. 6 mL 3   fluticasone (FLONASE) 50 MCG/ACT nasal spray Place 2 sprays into both nostrils daily as needed for allergies or rhinitis.      guaiFENesin-dextromethorphan (ROBITUSSIN DM) 100-10 MG/5ML syrup Take 5 mLs by mouth 3 (three) times daily as needed for cough. 118 mL 0   irbesartan (AVAPRO) 150 MG tablet TAKE 1 TABLET DAILY (DOSE DECREASE) 90 tablet 3   loratadine (CLARITIN) 10 MG tablet Take 10 mg by mouth daily.      potassium chloride (KLOR-CON) 10 MEQ tablet Take 3 tablets (30 mEq total) by mouth 2 (two) times daily. 540 tablet 1   potassium chloride (MICRO-K) 10 MEQ CR capsule Take by mouth.     solifenacin (VESICARE) 5 MG tablet Take 5 mg by mouth as needed.     spironolactone (ALDACTONE) 25 MG tablet Take 0.5 tablets (12.5 mg total) by mouth daily. 45 tablet 3   timolol (TIMOPTIC) 0.5 % ophthalmic solution Place 1 drop into both eyes daily.   4   No current facility-administered medications for this visit.     Physical Exam: Blood pressure 135/80, pulse 63, resp. rate 20, height 5' 8"$  (1.727 m), weight 208 lb (94.3 kg), SpO2 96 %.         Exam    General- alert and comfortable    Neck- no JVD, no cervical adenopathy palpable, no carotid bruit   Lungs- clear without rales, wheezes   Cor- regular rate and rhythm, no murmur , gallop   Abdomen- soft, non-tender   Extremities - warm, non-tender, minimal edema   Neuro- oriented, appropriate, no focal weakness  Diagnostic Tests: I personally reviewed the images of today's CT scan of the chest without contrast.  There has been no change in the 4.5 cm asymptomatic fusiform thoracic aneurysm.  The mediastinal cyst remains at 3 cm diameter and also is asymptomatic.  Impression: Asymptomatic mild/moderate stable 4.5 cm ascending aneurysm.  Blood pressure control and serial surveillance scans  are the best approach as the risk of surgery would be much greater than the risk of a spontaneous tear/dissection which would be about 1-2%.  The patient's mediastinal cyst is asymptomatic, relatively small, and shows no compromise of any other vascular structures.  It does not meet criteria for resection.  Best treatment is surveillance scans as well.  Plan: The patient will return in 1 year for a CT chest without contrast.  He is encouraged to follow heart healthy lifestyle.  He remains fairly active and rides a stationary bike in his home about 3 times a  week.   Dahlia Byes, MD [facility] [phone]

## 2023-01-14 ENCOUNTER — Encounter (HOSPITAL_COMMUNITY): Payer: Self-pay

## 2023-01-14 ENCOUNTER — Emergency Department (HOSPITAL_COMMUNITY)
Admission: EM | Admit: 2023-01-14 | Discharge: 2023-01-14 | Disposition: A | Discharge status: Home / Self Care | Payer: Medicare Other | Attending: Emergency Medicine | Admitting: Emergency Medicine

## 2023-01-14 ENCOUNTER — Other Ambulatory Visit: Payer: Self-pay

## 2023-01-14 DIAGNOSIS — K222 Esophageal obstruction: Secondary | ICD-10-CM | POA: Diagnosis not present

## 2023-01-14 DIAGNOSIS — Z7902 Long term (current) use of antithrombotics/antiplatelets: Secondary | ICD-10-CM | POA: Insufficient documentation

## 2023-01-14 DIAGNOSIS — W44F3XA Food entering into or through a natural orifice, initial encounter: Secondary | ICD-10-CM | POA: Insufficient documentation

## 2023-01-14 DIAGNOSIS — Z79899 Other long term (current) drug therapy: Secondary | ICD-10-CM | POA: Insufficient documentation

## 2023-01-14 DIAGNOSIS — T189XXA Foreign body of alimentary tract, part unspecified, initial encounter: Secondary | ICD-10-CM | POA: Diagnosis present

## 2023-01-14 DIAGNOSIS — T18128A Food in esophagus causing other injury, initial encounter: Secondary | ICD-10-CM

## 2023-01-14 NOTE — ED Provider Notes (Signed)
[facility] Provider Note   CSN: [id number] Arrival date & time: 01/14/23  1016     History  Chief Complaint  Patient presents with   Something stuck in throat    Bob Elliott is a 82 y.o. male.  82 yo M with a chief complaints of difficulty swallowing.  He tells me that he had trouble off and on.  Had his esophagus stretched he thinks about 3 months ago.  He is not sure the doctor that did it.  He was eating breakfast this morning and felt like something got stuck.  He could not tolerate liquids or solids and felt a bit panicked.  Denied any obvious difficulty breathing or coughing.  He then came to the emergency department for evaluation.  Upon getting out of his car he ended up vomiting and said a very large amount of food came up and he now feels better.        Home Medications Prior to Admission medications   Medication Sig Start Date End Date Taking? Authorizing Provider  amLODipine (NORVASC) 5 MG tablet Take 1 tablet (5 mg total) by mouth daily. 12/28/22   Freada Bergeron, MD  clopidogrel (PLAVIX) 75 MG tablet Take 1 tablet (75 mg total) by mouth daily. 06/04/22   Freada Bergeron, MD  clotrimazole-betamethasone (LOTRISONE) cream Apply 1 Application topically 2 (two) times daily. 08/05/22   [provider]  doxazosin (CARDURA) 4 MG tablet Take 4 mg by mouth at bedtime.  02/12/14   [provider]  esomeprazole (NEXIUM) 40 MG capsule Take 40 mg by mouth 2 (two) times daily. 08/15/22   [provider]  Evolocumab (REPATHA SURECLICK) [id number] MG/ML SOAJ Inject 140 mg into the skin every 14 (fourteen) days. 12/30/22   Freada Bergeron, MD  fluticasone (FLONASE) 50 MCG/ACT nasal spray Place 2 sprays into both nostrils daily as needed for allergies or rhinitis.  03/28/14   [provider]  guaiFENesin-dextromethorphan (ROBITUSSIN DM) 100-10 MG/5ML syrup Take 5 mLs by mouth 3 (three) times daily as  needed for cough. 06/24/21   Davonna Belling, MD  irbesartan (AVAPRO) 150 MG tablet TAKE 1 TABLET DAILY (DOSE DECREASE) 11/02/22   Freada Bergeron, MD  loratadine (CLARITIN) 10 MG tablet Take 10 mg by mouth daily.    [provider]  potassium chloride (KLOR-CON) 10 MEQ tablet Take 3 tablets (30 mEq total) by mouth 2 (two) times daily. 10/13/20   Dorothy Spark, MD  potassium chloride (MICRO-K) 10 MEQ CR capsule Take by mouth. 08/30/22   [provider]  solifenacin (VESICARE) 5 MG tablet Take 5 mg by mouth as needed. 05/23/20   [provider]  spironolactone (ALDACTONE) 25 MG tablet Take 0.5 tablets (12.5 mg total) by mouth daily. 12/01/22   Freada Bergeron, MD  timolol (TIMOPTIC) 0.5 % ophthalmic solution Place 1 drop into both eyes daily.  11/25/17   [provider]      Allergies    Aspirin, Codeine, Pravastatin sodium, Shellfish allergy, Amitriptyline, Gabapentin, Other, Rosuvastatin, Tramadol hcl, and Zetia [ezetimibe]    Review of Systems   Review of Systems  Physical Exam Updated Vital Signs BP (!) 143/72   Pulse 64   Temp (!) 97.5 F (36.4 C)   Resp 20   SpO2 100%  Physical Exam Vitals and nursing note reviewed.  Constitutional:      Appearance: He is well-developed.  HENT:     Head:  Normocephalic and atraumatic.     Comments: No signs of posterior pharyngeal erythema or edema.  Tolerating secretions without difficulty. Eyes:     Pupils: Pupils are equal, round, and reactive to light.  Neck:     Vascular: No JVD.  Cardiovascular:     Rate and Rhythm: Normal rate and regular rhythm.     Heart sounds: No murmur heard.    No friction rub. No gallop.  Pulmonary:     Effort: No respiratory distress.     Breath sounds: No wheezing.  Abdominal:     General: There is no distension.     Tenderness: There is no abdominal tenderness. There is no guarding or rebound.  Musculoskeletal:        General: Normal range of motion.      Cervical back: Normal range of motion and neck supple.  Skin:    Coloration: Skin is not pale.     Findings: No rash.  Neurological:     Mental Status: He is alert and oriented to person, place, and time.  Psychiatric:        Behavior: Behavior normal.     ED Results / Procedures / Treatments   Labs (all labs ordered are listed, but only abnormal results are displayed) Labs Reviewed - No data to display  EKG None  Radiology No results found.  Procedures Procedures    Medications Ordered in ED Medications - No data to display  ED Course/ Medical Decision Making/ A&P                             Medical Decision Making  82 yo M with a chief complaints of what sounds like a esophageal food impaction.  This started during breakfast.  He tried taking some sips of coffee without significant improvement and then when he got here he vomited forcefully and said a big chunk of food came out.  Since then he is feeling a bit better.  He tells me he did have his esophagus stretched about 3 months ago but he is not sure the doctor that performed the procedure.  He has clear lung sounds for me.  No difficulty breathing.  Feel aspirations unlikely.  Oral trial.  Patient able to tolerate by mouth without issue.  Will discharge home.  GI follow-up.  11:30 AM:  I have discussed the diagnosis/risks/treatment options with the patient.  Evaluation and diagnostic testing in the emergency department does not suggest an emergent condition requiring admission or immediate intervention beyond what has been performed at this time.  They will follow up with GI. We also discussed returning to the ED immediately if new or worsening sx occur. We discussed the sx which are most concerning (e.g., sudden worsening pain, fever, inability to tolerate by mouth) that necessitate immediate return. Medications administered to the patient during their visit and any new prescriptions provided to the patient are  listed below.  Medications given during this visit Medications - No data to display   The patient appears reasonably screen and/or stabilized for discharge and I doubt any other medical condition or other [facility] requiring further screening, evaluation, or treatment in the ED at this time prior to discharge.          Final Clinical Impression(s) / ED Diagnoses Final diagnoses:  Esophageal obstruction due to food impaction    Rx / DC Orders ED Discharge Orders     None  Deno Etienne, DO 01/14/23 1130

## 2023-01-14 NOTE — ED Notes (Signed)
Pt given cup of water and was able to drink water with no difficulties.

## 2023-01-14 NOTE — ED Triage Notes (Signed)
Pt came in via POV d/t something getting "stuck in his throat." He noticed this after he ate breakfast he vomited a few times & then he cannot not get what he feels "down." A/Ox4, reports 3/10 pain in his chest, 96% O2 on RA while in triage.

## 2023-01-14 NOTE — Discharge Instructions (Signed)
Please eat a soft diet until you are able to get in contact with your gastroenterologist.  They may want to see you sooner in the office and decide if you need to have free stretching of your esophagus.  Please return to the emergency department setting if this recurs and does not improve.

## 2023-01-20 DIAGNOSIS — K219 Gastro-esophageal reflux disease without esophagitis: Secondary | ICD-10-CM | POA: Diagnosis not present

## 2023-01-20 DIAGNOSIS — R1310 Dysphagia, unspecified: Secondary | ICD-10-CM | POA: Diagnosis not present

## 2023-01-20 DIAGNOSIS — K222 Esophageal obstruction: Secondary | ICD-10-CM | POA: Diagnosis not present

## 2023-01-21 DIAGNOSIS — R1310 Dysphagia, unspecified: Secondary | ICD-10-CM | POA: Diagnosis not present

## 2023-01-21 DIAGNOSIS — K222 Esophageal obstruction: Secondary | ICD-10-CM | POA: Diagnosis not present

## 2023-02-09 DIAGNOSIS — M65311 Trigger thumb, right thumb: Secondary | ICD-10-CM | POA: Diagnosis not present

## 2023-02-09 DIAGNOSIS — M25512 Pain in left shoulder: Secondary | ICD-10-CM | POA: Diagnosis not present

## 2023-02-16 ENCOUNTER — Telehealth: Payer: Self-pay | Admitting: Cardiology

## 2023-02-16 NOTE — Telephone Encounter (Signed)
.  Pt c/o medication issue:  1. Name of Medication: Repatha  2. How are you currently taking this medication (dosage and times per day)?   3. Are you having a reaction (difficulty breathing--STAT)?   4. What is your medication issue? No energy, fatigued, aching in his knees, shoulder, back, hands and arms-- he said he was not taking it anymore until he saw her again

## 2023-02-16 NOTE — Telephone Encounter (Signed)
Pt already intolerant to rosuvastatin 5mg  3x per week, pravastatin 40mg  daily, atorvastatin 10-40mg  daily, ezetimibe 10mg  daily, and now Repatha. Options are limited. Can try Nexletol if pt would like.    Spoke with pt, states he does not want to try another medication at this time and prefers to focus on dietary improvements.

## 2023-02-16 NOTE — Telephone Encounter (Signed)
Pt c/o medication issue:  1. Name of Medication: Repatha  2. How are you currently taking this medication (dosage and times per day)?   3. Are you having a reaction (difficulty breathing--STAT)? No  4. What is your medication issue? Pt called back stating he spoke with Fuller [place] about this medication. Pt stated he was going to continue taking the Repatha and have his granddaughter help him with it. Please advise

## 2023-02-17 NOTE — Telephone Encounter (Signed)
That is fine if pt wants to stay on med, he previously reported muscle aches and wanted to stop. I have added med back to his list. Granddaughter is an NP and convinced him to continue. Advised him to call back with any tolerability issues.

## 2023-02-18 DIAGNOSIS — K219 Gastro-esophageal reflux disease without esophagitis: Secondary | ICD-10-CM | POA: Diagnosis not present

## 2023-02-18 DIAGNOSIS — M25512 Pain in left shoulder: Secondary | ICD-10-CM | POA: Diagnosis not present

## 2023-03-08 ENCOUNTER — Telehealth: Payer: Self-pay | Admitting: *Deleted

## 2023-03-08 ENCOUNTER — Telehealth: Payer: Self-pay

## 2023-03-08 DIAGNOSIS — R490 Dysphonia: Secondary | ICD-10-CM | POA: Diagnosis not present

## 2023-03-08 DIAGNOSIS — R1310 Dysphagia, unspecified: Secondary | ICD-10-CM | POA: Diagnosis not present

## 2023-03-08 DIAGNOSIS — K219 Gastro-esophageal reflux disease without esophagitis: Secondary | ICD-10-CM | POA: Diagnosis not present

## 2023-03-08 DIAGNOSIS — K222 Esophageal obstruction: Secondary | ICD-10-CM | POA: Diagnosis not present

## 2023-03-08 NOTE — Telephone Encounter (Signed)
Pt has been scheduled for tele pr op appt 03/14/23 @ 2:40. Med rec and consent are done.     Patient Consent for Virtual Visit        [name] has provided verbal consent on 03/08/2023 for a virtual visit (video or telephone).   CONSENT FOR VIRTUAL VISIT FOR:  Bob Elliott  By participating in this virtual visit I agree to the following:  I hereby voluntarily request, consent and authorize [facility] and its employed or contracted physicians, physician assistants, nurse practitioners or other licensed health care professionals (the Practitioner), to provide me with telemedicine health care services (the "Services") as deemed necessary by the treating Practitioner. I acknowledge and consent to receive the Services by the Practitioner via telemedicine. I understand that the telemedicine visit will involve communicating with the Practitioner through live audiovisual communication technology and the disclosure of certain medical information by electronic transmission. I acknowledge that I have been given the opportunity to request an in-person assessment or other available alternative prior to the telemedicine visit and am voluntarily participating in the telemedicine visit.  I understand that I have the right to withhold or withdraw my consent to the use of telemedicine in the course of my care at any time, without affecting my right to future care or treatment, and that the Practitioner or I may terminate the telemedicine visit at any time. I understand that I have the right to inspect all information obtained and/or recorded in the course of the telemedicine visit and may receive copies of available information for a reasonable fee.  I understand that some of the potential risks of receiving the Services via telemedicine include:  Delay or interruption in medical evaluation due to technological equipment failure or disruption; Information transmitted may not be sufficient (e.g. poor  resolution of images) to allow for appropriate medical decision making by the Practitioner; and/or  In rare instances, security protocols could fail, causing a breach of personal health information.  Furthermore, I acknowledge that it is my responsibility to provide information about my medical history, conditions and care that is complete and accurate to the best of my ability. I acknowledge that Practitioner's advice, recommendations, and/or decision may be based on factors not within their control, such as incomplete or inaccurate data provided by me or distortions of diagnostic images or specimens that may result from electronic transmissions. I understand that the practice of medicine is not an exact science and that Practitioner makes no warranties or guarantees regarding treatment outcomes. I acknowledge that a copy of this consent can be made available to me via my patient portal [facility] MyChart), or I can request a printed copy by calling the office of [facility].    I understand that my insurance will be billed for this visit.   I have read or had this consent read to me. I understand the contents of this consent, which adequately explains the benefits and risks of the Services being provided via telemedicine.  I have been provided ample opportunity to ask questions regarding this consent and the Services and have had my questions answered to my satisfaction. I give my informed consent for the services to be provided through the use of telemedicine in my medical care

## 2023-03-08 NOTE — Telephone Encounter (Signed)
Primary Cardiologist:Heather Becky Sax, MD   Preoperative team, please contact this patient and set up a phone call appointment for further preoperative risk assessment. Of note, he has an appointment with Dr. Shari Prows on 04/06/23. He may choose to keep that appointment, but since it has been > 2 months since previous visit, telephone or office assessment for cardiac risk for upcoming procedure is required.  Please obtain consent and complete medication review. Thank you for your help.   Per office protocol, and pending no symptoms of ACS, he may hold Plavix for 5 days prior to procedure.  Levi Aland, NP-C  03/08/2023, 12:37 PM [address], [address] Office [phone] Fax [phone]

## 2023-03-08 NOTE — Telephone Encounter (Signed)
....     Pre-operative Risk Assessment    Patient Name: Bob Elliott  DOB: 12/15/1940 MRN: [id number]      Request for Surgical Clearance    Procedure:  EGD WITH DIL  Date of Surgery:  Clearance 03/30/23                                 Surgeon:  DR Levora Angel Surgeon's Group or Practice Name:  [facility] Phone number:  [phone] Fax number:  [phone]   Type of Clearance Requested:   - Medical  - Pharmacy:  Hold Clopidogrel (Plavix) HOLD FOR 5 DAYS PRIOR AND CLEARANCE   Type of Anesthesia:   PROPOFOL   Additional requests/questions:    Jola Babinski   03/08/2023, 11:18 AM

## 2023-03-08 NOTE — Telephone Encounter (Signed)
Pt has been scheduled for tele pr op appt 03/14/23 @ 2:40. Med rec and consent are done.

## 2023-03-13 NOTE — Progress Notes (Unsigned)
Virtual Visit via Telephone Note   Because of Sephiroth Mcluckie Nurse's co-morbid illnesses, he is at least at moderate risk for complications without adequate follow up.  This format is felt to be most appropriate for this patient at this time.  The patient did not have access to video technology/had technical difficulties with video requiring transitioning to audio format only (telephone).  All issues noted in this document were discussed and addressed.  No physical exam could be performed with this format.  Please refer to the patient's chart for his consent to telehealth for [facility].  Evaluation Performed:  Preoperative cardiovascular risk assessment _____________   Date:  03/13/2023   Patient ID:  Bob Elliott, DOB 05-22-1941, MRN [id number] Patient Location:  Home Provider location:   Office  Primary Care Provider:  Georgann Housekeeper, MD Primary Cardiologist:  Meriam Sprague, MD  Chief Complaint / Patient Profile   82 y.o. 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  History of Present Illness    Bob Elliott is a 82 y.o. male who presents via audio/video conferencing for a telehealth visit today.  Pt was last seen in cardiology clinic on 10/14/2022 by Dr. Shari Prows.  At that time Bob Elliott was doing well with exception of dyspnea on exertion that was unchanged from previous visit and stable blood pressure.  The patient is now pending procedure as outlined above. Since his last visit, he is doing well with no new cardiac complaints.  He was seen in the ED on 01/14/2023 for obstruction in his throat.  He was not admitted and was discharged the same day.   He denies chest pain, shortness of breath, lower extremity edema, fatigue, palpitations, melena, hematuria, hemoptysis, diaphoresis,  weakness, presyncope, syncope, orthopnea, and PND.    Past Medical History    Past Medical History:  Diagnosis Date   Arthritis    BPH (benign prostatic hyperplasia)    CKD (chronic kidney disease), stage II    a. CKD II-III by labs.   Dizziness 10/21/2016   Esophageal dysmotility    Essential hypertension    Trial off acei 08/12/2017 for unexplained sob/ noct smothering    GERD (gastroesophageal reflux disease)    GI bleed    Glaucoma    BOTH EYES   Hyperlipidemia    Hypertension    Inguinal hernia without mention of obstruction or gangrene, unilateral or unspecified, (not specified as recurrent)-right s/p repair with mesh 05/21/2014   Schatzki's ring    Sinus bradycardia    a. prompting dc of Diltiazem (?chronotropic incompetence) in 2017.   Sleep apnea    "mild" no cpap   Thoracic aortic aneurysm (TAA) (HCC)    Past Surgical History:  Procedure Laterality Date   COLONOSCOPY WITH PROPOFOL N/A 05/11/2016   Procedure: COLONOSCOPY WITH PROPOFOL;  Surgeon: Charolett Bumpers, MD;  Location: [facility];  Service: Endoscopy;  Laterality: N/A;   EYE SURGERY     CATARACT R  EYE   HERNIA REPAIR     UMBILICAL   INGUINAL HERNIA REPAIR Right 06/18/2014   Procedure: RIGHT INGUINAL HERNIA REPAIR;  Surgeon: Adolph Pollack, MD;  Location: [facility];  Service: General;  Laterality: Right;   INSERTION OF MESH Right 06/18/2014   Procedure: INSERTION OF MESH;  Surgeon: Adolph Pollack,  MD;  Location: [facility] OR;  Service: General;  Laterality: Right;   NECK SURGERY  2000   cervical disc surgery(limited ROM)- retained hardware   TONSILLECTOMY      Allergies  Allergies  Allergen Reactions   Aspirin Hives, Itching and Nausea Only    Other reaction(s): GI Upset   Codeine Hives, Itching, Nausea Only and Other (See Comments)    Dizzy spells   Pravastatin Sodium Other (See Comments)    PT C/O MYALGIAS AND JOINT PAIN   Shellfish Allergy Hives   Amitriptyline     Other reaction(s): Intolerant    Atorvastatin     Myalgias on 10-40mg  dosing   Gabapentin     Other reaction(s): Intolerant   Other     Other reaction(s): stomach upset   Repatha [Evolocumab]     Fatigue, muscle pain   Rosuvastatin     Myalgias on 5mg  3x/week   Tramadol Hcl     Other reaction(s): grogginess   Zetia [Ezetimibe] Other (See Comments)    Pt reports causes joint and muscle aches    Home Medications    Prior to Admission medications   Medication Sig Start Date End Date Taking? Authorizing Provider  amLODipine (NORVASC) 5 MG tablet Take 1 tablet (5 mg total) by mouth daily. 12/28/22   Meriam Sprague, MD  clopidogrel (PLAVIX) 75 MG tablet Take 1 tablet (75 mg total) by mouth daily. 06/04/22   Meriam Sprague, MD  clotrimazole-betamethasone (LOTRISONE) cream Apply 1 Application topically 2 (two) times daily. 08/05/22   [provider]  doxazosin (CARDURA) 4 MG tablet Take 4 mg by mouth at bedtime.  02/12/14   [provider]  esomeprazole (NEXIUM) 40 MG capsule Take 40 mg by mouth 2 (two) times daily. 08/15/22   [provider]  Evolocumab (REPATHA SURECLICK) 140 MG/ML SOAJ Inject 1 each into the skin every 14 (fourteen) days.    [provider]  fluticasone (FLONASE) 50 MCG/ACT nasal spray Place 2 sprays into both nostrils daily as needed for allergies or rhinitis.  03/28/14   [provider]  guaiFENesin-dextromethorphan (ROBITUSSIN DM) 100-10 MG/5ML syrup Take 5 mLs by mouth 3 (three) times daily as needed for cough. 06/24/21   Benjiman Core, MD  irbesartan (AVAPRO) 150 MG tablet TAKE 1 TABLET DAILY (DOSE DECREASE) 11/02/22   Meriam Sprague, MD  loratadine (CLARITIN) 10 MG tablet Take 10 mg by mouth daily.    [provider]  potassium chloride (KLOR-CON) 10 MEQ tablet Take 3 tablets (30 mEq total) by mouth 2 (two) times daily. 10/13/20   Lars Masson, MD  potassium chloride (MICRO-K) 10 MEQ CR capsule Take by mouth. 08/30/22    [provider]  solifenacin (VESICARE) 5 MG tablet Take 5 mg by mouth as needed. 05/23/20   [provider]  spironolactone (ALDACTONE) 25 MG tablet Take 0.5 tablets (12.5 mg total) by mouth daily. 12/01/22   Meriam Sprague, MD  timolol (TIMOPTIC) 0.5 % ophthalmic solution Place 1 drop into both eyes daily.  11/25/17   [provider]    Physical Exam    Vital Signs:  Bob Elliott does not have vital signs available for review today.  Given telephonic nature of communication, physical exam is limited. AAOx3. NAD. Normal affect.  Speech and respirations are unlabored.  Accessory Clinical Findings    None  Assessment & Plan    1.  Preoperative Cardiovascular Risk Assessment:  -Patient's RCRI score is 0.4%  The patient affirms he has been doing well without any new cardiac symptoms. They are able to achieve 5 METS without cardiac limitations. Therefore, based on ACC/AHA guidelines, the patient would be at acceptable risk for the planned procedure without further cardiovascular testing. The patient was advised that if he develops new symptoms prior to surgery to contact our office to arrange for a follow-up visit, and he verbalized understanding.   The patient was advised that if he develops new symptoms prior to surgery to contact our office to arrange for a follow-up visit, and he verbalized understanding.  Patient will hold Plavix 5 days prior to procedure and should restart as soon as possible postprocedure.  A copy of this note will be routed to requesting surgeon.  Time:   Today, I have spent 6 minutes with the patient with telehealth technology discussing medical history, symptoms, and management plan.     Napoleon Form, Leodis Rains, NP  03/13/2023, 6:35 PM

## 2023-03-14 ENCOUNTER — Ambulatory Visit: Payer: Medicare Other | Attending: Cardiovascular Disease

## 2023-03-14 DIAGNOSIS — Z01810 Encounter for preprocedural cardiovascular examination: Secondary | ICD-10-CM

## 2023-03-22 ENCOUNTER — Other Ambulatory Visit: Payer: Self-pay

## 2023-03-22 MED ORDER — REPATHA SURECLICK 140 MG/ML ~~LOC~~ SOAJ: 1.0000 | SUBCUTANEOUS | 11 refills | Status: DC

## 2023-03-30 DIAGNOSIS — R1310 Dysphagia, unspecified: Secondary | ICD-10-CM | POA: Diagnosis not present

## 2023-03-30 DIAGNOSIS — K2950 Unspecified chronic gastritis without bleeding: Secondary | ICD-10-CM | POA: Diagnosis not present

## 2023-03-30 DIAGNOSIS — K317 Polyp of stomach and duodenum: Secondary | ICD-10-CM | POA: Diagnosis not present

## 2023-03-30 DIAGNOSIS — Q396 Congenital diverticulum of esophagus: Secondary | ICD-10-CM | POA: Diagnosis not present

## 2023-03-30 DIAGNOSIS — K222 Esophageal obstruction: Secondary | ICD-10-CM | POA: Diagnosis not present

## 2023-04-04 NOTE — Progress Notes (Unsigned)
Cardiology Office Note:    Date:  04/06/2023   ID:  Bob Elliott, DOB [birth year], MRN [id number]  PCP:  Georgann Housekeeper, MD   [facility] Providers Cardiologist:  Meriam Sprague, MD {  Referring MD: Georgann Housekeeper, MD    History of Present Illness:    Bob Elliott is a 82 y.o. 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 Delton See who now returns to clinic for CV follow-up.  Per review of the record, the patient has a history of amaurosis fugax and underwent cardiac monitor which showed SB-NSR and short runs NSVT 4 and 9 beats and no Afib. On 02/20/20, the patient was having DOE. Myoview normal LV function and no ischemia. Patient intolerant to ASA so started on Plavix b/c of recent TIA.  Saw Dr. Delton See on 10/28/20, he was doing well. Continued to have mild DOE. No HF symptoms. Has been following with Dr. Vickey Sages for ascending aortic aneurysm, which was stable at 43mm on last CTA in 11/2020. Myoview 02/26/20 with normal EF, no evidence of ischemia.   Was last seen in clinic on 04/2022 where he was doing well from a CV standpoint.  Today, the patient overall feels okay. Has been having difficulty swallowing and has been following with GI for schatzki ring in the distal esophagus (has had prior dilation). Has occasional chest pain if he tries to swallow pills and they do not go down the right way. Otherwise, no exertional chest pain, SOB, LE edema, orthopnea or PND. Tolerating medications as prescribed. Blood pressure is well controlled.   Past Medical History:  Diagnosis Date   Arthritis    BPH (benign prostatic hyperplasia)    CKD (chronic kidney disease), stage II    a. CKD II-III by labs.   Dizziness 10/21/2016   Esophageal dysmotility    Essential hypertension    Trial off acei 08/12/2017 for unexplained sob/ noct smothering    GERD (gastroesophageal reflux disease)    GI bleed    Glaucoma    BOTH EYES    Hyperlipidemia    Hypertension    Inguinal hernia without mention of obstruction or gangrene, unilateral or unspecified, (not specified as recurrent)-right s/p repair with mesh 05/21/2014   Schatzki's ring    Sinus bradycardia    a. prompting dc of Diltiazem (?chronotropic incompetence) in 2017.   Sleep apnea    "mild" no cpap   Thoracic aortic aneurysm (TAA) (HCC)     Past Surgical History:  Procedure Laterality Date   COLONOSCOPY WITH PROPOFOL N/A 05/11/2016   Procedure: COLONOSCOPY WITH PROPOFOL;  Surgeon: Charolett Bumpers, MD;  Location: [facility];  Service: Endoscopy;  Laterality: N/A;   EYE SURGERY     CATARACT R  EYE   HERNIA REPAIR     UMBILICAL   INGUINAL HERNIA REPAIR Right 06/18/2014   Procedure: RIGHT INGUINAL HERNIA REPAIR;  Surgeon: Adolph Pollack, MD;  Location: [facility] OR;  Service: General;  Laterality: Right;   INSERTION OF MESH Right 06/18/2014   Procedure: INSERTION OF MESH;  Surgeon: Adolph Pollack, MD;  Location: [facility] OR;  Service: General;  Laterality: Right;   NECK SURGERY  2000   cervical disc surgery(limited ROM)- retained hardware   TONSILLECTOMY      Current Medications: Current Meds  Medication Sig   amLODipine (NORVASC) 5 MG tablet Take 1 tablet (5 mg total) by mouth daily.   clopidogrel (PLAVIX)  75 MG tablet Take 1 tablet (75 mg total) by mouth daily.   clotrimazole-betamethasone (LOTRISONE) cream Apply 1 Application topically 2 (two) times daily.   doxazosin (CARDURA) 4 MG tablet Take 4 mg by mouth at bedtime.    esomeprazole (NEXIUM) 40 MG capsule Take 40 mg by mouth 2 (two) times daily.   Evolocumab (REPATHA SURECLICK) 140 MG/ML SOAJ Inject 140 mg into the skin every 14 (fourteen) days.   fluticasone (FLONASE) 50 MCG/ACT nasal spray Place 2 sprays into both nostrils daily as needed for allergies or rhinitis.    guaiFENesin-dextromethorphan (ROBITUSSIN DM) 100-10 MG/5ML syrup Take 5 mLs by mouth 3 (three) times daily as needed for cough.   irbesartan  (AVAPRO) 150 MG tablet TAKE 1 TABLET DAILY (DOSE DECREASE)   loratadine (CLARITIN) 10 MG tablet Take 10 mg by mouth daily.   potassium chloride (KLOR-CON) 10 MEQ tablet Take 3 tablets (30 mEq total) by mouth 2 (two) times daily.   potassium chloride (MICRO-K) 10 MEQ CR capsule Take by mouth.   solifenacin (VESICARE) 5 MG tablet Take 5 mg by mouth as needed.   spironolactone (ALDACTONE) 25 MG tablet Take 0.5 tablets (12.5 mg total) by mouth daily.   timolol (TIMOPTIC) 0.5 % ophthalmic solution Place 1 drop into both eyes daily.      Allergies:   Aspirin, Codeine, Pravastatin sodium, Shellfish allergy, Amitriptyline, Atorvastatin, Gabapentin, Other, Repatha [evolocumab], Rosuvastatin, Tramadol hcl, and Zetia [ezetimibe]   Social History   Socioeconomic History   Marital status: Married    Spouse name: Not on file   Number of children: 4   Years of education: Not on file   Highest education level: Not on file  Occupational History   Occupation: retired  Tobacco Use   Smoking status: Never   Smokeless tobacco: Never  Vaping Use   Vaping Use: Never used  Substance and Sexual Activity   Alcohol use: Yes    Comment: none since 80's   Drug use: No   Sexual activity: Not Currently  Other Topics Concern   Not on file  Social History Narrative   Not on file   Social Determinants of Health   Financial Resource Strain: Not on file  Food Insecurity: Not on file  Transportation Needs: Not on file  Physical Activity: Not on file  Stress: Not on file  Social Connections: Not on file     Family History: The patient's family history includes Cancer in his brother; Diabetes in his sister; Heart disease in his mother; Leukemia in his maternal grandmother; Stroke in his father.  ROS:   As per HPI  EKGs/Labs/Other Studies Reviewed:    Cardiac Studies & Procedures     STRESS TESTS  MYOCARDIAL PERFUSION IMAGING 02/26/2020  Narrative  Nuclear stress EF: 58%.  The left ventricular  ejection fraction is normal (55-65%).  There was no ST segment deviation noted during stress.  This is a low risk study.  Apical thinning not thought to be significant No ischemia EF 58%   ECHOCARDIOGRAM  ECHOCARDIOGRAM COMPLETE 11/04/2022  Narrative ECHOCARDIOGRAM REPORT    Patient Name:   Bob Elliott Date of Exam: 11/04/2022 Medical Rec #:  [id number]     Height:       68.0 in Accession #:    [id number]    Weight:       211.0 lb Date of Birth:  Nov 17, 1940     BSA:          2.091 m Patient Age:  81 years      BP:           163/93 mmHg Patient Gender: M             HR:           54 bpm. Exam Location:  [address]  Procedure: 2D Echo, Color Doppler, Cardiac Doppler and 3D Echo  Indications:    Aortic Regurgitation I35.1; R06.9 DOE  History:        Patient has prior history of Echocardiogram examinations, most recent 09/20/2019. Risk Factors:Hypertension and Dyslipidemia.  Sonographer:    Thurman Coyer RDCS Referring Phys: [id number] Brooklynne Pereida E Genevive Printup  IMPRESSIONS   1. Left ventricular ejection fraction, by estimation, is 60 to 65%. The left ventricle has normal function. The left ventricle has no regional wall motion abnormalities. There is mild concentric left ventricular hypertrophy. Left ventricular diastolic parameters are consistent with Grade I diastolic dysfunction (impaired relaxation). 2. Right ventricular systolic function is normal. The right ventricular size is normal. 3. Left atrial size was mildly dilated. 4. The mitral valve is normal in structure. Trivial mitral valve regurgitation. No evidence of mitral stenosis. 5. The aortic valve is tricuspid. Aortic valve regurgitation is moderate. No aortic stenosis is present. 6. Aortic dilatation noted. There is mild dilatation of the aortic root, measuring 40 mm. There is moderate dilatation of the ascending aorta, measuring 45 mm. 7. The inferior vena cava is dilated in size with >50% respiratory  variability, suggesting right atrial pressure of 8 mmHg.  FINDINGS Left Ventricle: Left ventricular ejection fraction, by estimation, is 60 to 65%. The left ventricle has normal function. The left ventricle has no regional wall motion abnormalities. The left ventricular internal cavity size was normal in size. There is mild concentric left ventricular hypertrophy. Left ventricular diastolic parameters are consistent with Grade I diastolic dysfunction (impaired relaxation).  Right Ventricle: The right ventricular size is normal. No increase in right ventricular wall thickness. Right ventricular systolic function is normal.  Left Atrium: Left atrial size was mildly dilated.  Right Atrium: Right atrial size was normal in size.  Pericardium: There is no evidence of pericardial effusion.  Mitral Valve: The mitral valve is normal in structure. Trivial mitral valve regurgitation. No evidence of mitral valve stenosis.  Tricuspid Valve: The tricuspid valve is normal in structure. Tricuspid valve regurgitation is not demonstrated. No evidence of tricuspid stenosis.  Aortic Valve: The aortic valve is tricuspid. Aortic valve regurgitation is moderate. Aortic regurgitation PHT measures 596 msec. No aortic stenosis is present.  Pulmonic Valve: The pulmonic valve was normal in structure. Pulmonic valve regurgitation is not visualized. No evidence of pulmonic stenosis.  Aorta: Aortic dilatation noted. There is mild dilatation of the aortic root, measuring 40 mm. There is moderate dilatation of the ascending aorta, measuring 45 mm.  Venous: The inferior vena cava is dilated in size with greater than 50% respiratory variability, suggesting right atrial pressure of 8 mmHg.  IAS/Shunts: No atrial level shunt detected by color flow Doppler.   LEFT VENTRICLE PLAX 2D LVIDd:         4.40 cm   Diastology LVIDs:         2.80 cm   LV e' medial:    6.09 cm/s LV PW:         1.20 cm   LV E/e' medial:  17.6 LV  IVS:        1.20 cm   LV e' lateral:   6.64 cm/s LVOT diam:  2.40 cm   LV E/e' lateral: 16.1 LV SV:         109 LV SV Index:   52 LVOT Area:     4.52 cm  3D Volume EF: 3D EF:        63 % LV EDV:       93 ml LV ESV:       35 ml LV SV:        58 ml  RIGHT VENTRICLE             IVC RV Basal diam:  3.00 cm     IVC diam: 2.10 cm RV Mid diam:    3.10 cm RV S prime:     12.40 cm/s TAPSE (M-mode): 1.8 cm  LEFT ATRIUM           Index        RIGHT ATRIUM           Index LA diam:      3.00 cm 1.43 cm/m   RA Area:     15.60 cm LA Vol (A2C): 39.5 ml 18.89 ml/m  RA Volume:   35.20 ml  16.84 ml/m LA Vol (A4C): 69.3 ml 33.15 ml/m AORTIC VALVE LVOT Vmax:         111.00 cm/s LVOT Vmean:        66.900 cm/s LVOT VTI:          0.241 m AI PHT:            596 msec AR Vena Contracta: 0.30 cm  AORTA Ao Root diam: 4.00 cm Ao Asc diam:  4.50 cm  MITRAL VALVE MV Area (PHT): 3.12 cm     SHUNTS MV Decel Time: 243 msec     Systemic VTI:  0.24 m MV E velocity: 107.00 cm/s  Systemic Diam: 2.40 cm MV A velocity: 139.00 cm/s MV E/A ratio:  0.77  Chilton Si MD Electronically signed by Chilton Si MD Signature Date/Time: 11/04/2022/6:10:48 PM    Final    MONITORS  CARDIAC EVENT MONITOR 11/05/2019             EKG:  SR, PACs, 1st degree AV block-personally reviewed  Recent Labs: No results found for requested labs within last 365 days.  Recent Lipid Panel    Component Value Date/Time   CHOL 182 11/26/2022 1351   TRIG 226 (H) 11/26/2022 1351   HDL 43 11/26/2022 1351   CHOLHDL 4.2 11/26/2022 1351   CHOLHDL 4.0 09/20/2019 0740   VLDL 19 09/20/2019 0740   LDLCALC 100 (H) 11/26/2022 1351      Physical Exam:    VS:  BP 130/64   Pulse 71   Ht 5\' 8"  (1.727 m)   Wt 204 lb 9.6 oz (92.8 kg)   SpO2 95%   BMI 31.11 kg/m     Wt Readings from Last 3 Encounters:  04/06/23 204 lb 9.6 oz (92.8 kg)  01/03/23 208 lb (94.3 kg)  10/14/22 211 lb (95.7 kg)     GEN:   Comfortable, NAD HEENT: Normal NECK: No JVD; No carotid bruits CARDIAC: Irregular (PACs), no murmurs RESPIRATORY:  Clear to auscultation without rales, wheezing or rhonchi  ABDOMEN: Soft, non-tender, non-distended MUSCULOSKELETAL: BLE without edema SKIN: Warm and dry NEUROLOGIC:  Alert and oriented x 3 PSYCHIATRIC:  Normal affect   ASSESSMENT:    1. Chronic dyspnea   2. Mixed hyperlipidemia   3. Hyperlipidemia, unspecified hyperlipidemia type   4. Medication management   5.  Cerebral thrombosis with cerebral infarction   6. Aneurysm of descending thoracic aorta without rupture (HCC)   7. Essential hypertension   8. Aneurysm of ascending aorta without rupture (HCC)   9. Amaurosis fugax     PLAN:    In order of problems listed above:  #DOE: Stable. Reassuring cardiac work-up. Myoview negative for ischemia or infarction; normal LVEF. Suspect secondary to deconditioning given reassuring CV workup. -Negative ischemic work-up -Discussed importance of continued exercise to help with conditioning with goal moderate intensity exercise per week  #Thoracic Aortic Aneurysm: Measured 4.5cm on 12/2022. Follows with CT surgery -Continue irbesartan 150mg  daily -Continue repatha -Unable to tolerate ASA; on plavix -Follow-up with CT surgery as scheduled  #HTN: Stable and running 120s at home. -Continue amlodipine to 5mg  daily -Continue irbesartan 150mg  daily -Continue spironolactone 12.5mg  daily  #HLD: #Statin Intolerance: -Continue repatha  -Check lipids today -Off statin due to joint pain -Continue lifestyle modifications with diet and increased exercise  #Amaurosis Fugax: 30day monitor without Afib. No recurrence of Afib. Irregular rhythm on physical exam today. EKG w/ NSR and PACs -On plavix 75mg  daily (intolerant of ASA) -Continue repatha   #PACs On EKG today. Asymptomatic  #Moderate AR: -Noted on TTE in 10/2022 -Continue yearly monitoring  #HypoK: -Will  check BMET today; has required potassium supplementation ongoing -If able, will lower K dosing due to difficulty with swallowing pills  #Dysphagia: -Follow-up with GI as scheduled  Medication Adjustments/Labs and Tests Ordered: Current medicines are reviewed at length with the patient today.  Concerns regarding medicines are outlined above.  Orders Placed This Encounter  Procedures   Comp Met (CMET)   CBC w/Diff   TSH   HgB A1c   Lipid Profile   EKG 12-Lead   No orders of the defined types were placed in this encounter.   Patient Instructions  Medication Instructions:   Your physician recommends that you continue on your current medications as directed. Please refer to the Current Medication list given to you today.  *If you need a refill on your cardiac medications before your next appointment, please call your pharmacy*   Lab Work:  TODAY--CBC W DIFF, TSH, CMET, A1C, AND LIPIDS  If you have labs (blood work) drawn today and your tests are completely normal, you will receive your results only by: MyChart Message (if you have MyChart) OR A paper copy in the mail If you have any lab test that is abnormal or we need to change your treatment, we will call you to review the results.    Follow-Up: At [facility], you and your health needs are our priority.  As part of our continuing mission to provide you with exceptional heart care, we have created designated Provider Care Teams.  These Care Teams include your primary Cardiologist (physician) and Advanced Practice Providers (APPs -  Physician Assistants and Nurse Practitioners) who all work together to provide you with the care you need, when you need it.  We recommend signing up for the patient portal called "MyChart".  Sign up information is provided on this After Visit Summary.  MyChart is used to connect with patients for Virtual Visits (Telemedicine).  Patients are able to view lab/test results, encounter notes,  upcoming appointments, etc.  Non-urgent messages can be sent to your provider as well.   To learn more about what you can do with MyChart, go to [email or web address].    Your next appointment:   6 month(s)  Provider:   DR. Cristal Deer  Clifton James       Signed, Laurance Flatten, MD 04/06/2023 12:27 PM    [facility]

## 2023-04-06 ENCOUNTER — Encounter: Payer: Self-pay | Admitting: Cardiology

## 2023-04-06 ENCOUNTER — Ambulatory Visit: Payer: Medicare Other

## 2023-04-06 ENCOUNTER — Ambulatory Visit: Payer: Medicare Other | Attending: Cardiology | Admitting: Cardiology

## 2023-04-06 VITALS — BP 130/64 | HR 71 | Ht 68.0 in | Wt 204.6 lb

## 2023-04-06 DIAGNOSIS — R0609 Other forms of dyspnea: Secondary | ICD-10-CM | POA: Diagnosis not present

## 2023-04-06 DIAGNOSIS — I6330 Cerebral infarction due to thrombosis of unspecified cerebral artery: Secondary | ICD-10-CM

## 2023-04-06 DIAGNOSIS — Z79899 Other long term (current) drug therapy: Secondary | ICD-10-CM

## 2023-04-06 DIAGNOSIS — I10 Essential (primary) hypertension: Secondary | ICD-10-CM | POA: Diagnosis not present

## 2023-04-06 DIAGNOSIS — I7123 Aneurysm of the descending thoracic aorta, without rupture: Secondary | ICD-10-CM

## 2023-04-06 DIAGNOSIS — I7121 Aneurysm of the ascending aorta, without rupture: Secondary | ICD-10-CM

## 2023-04-06 DIAGNOSIS — E782 Mixed hyperlipidemia: Secondary | ICD-10-CM

## 2023-04-06 DIAGNOSIS — G453 Amaurosis fugax: Secondary | ICD-10-CM | POA: Diagnosis not present

## 2023-04-06 DIAGNOSIS — E785 Hyperlipidemia, unspecified: Secondary | ICD-10-CM

## 2023-04-06 NOTE — Patient Instructions (Signed)
Medication Instructions:   Your physician recommends that you continue on your current medications as directed. Please refer to the Current Medication list given to you today.  *If you need a refill on your cardiac medications before your next appointment, please call your pharmacy*   Lab Work:  TODAY--CBC W DIFF, TSH, CMET, A1C, AND LIPIDS  If you have labs (blood work) drawn today and your tests are completely normal, you will receive your results only by: MyChart Message (if you have MyChart) OR A paper copy in the mail If you have any lab test that is abnormal or we need to change your treatment, we will call you to review the results.    Follow-Up: At [facility], you and your health needs are our priority.  As part of our continuing mission to provide you with exceptional heart care, we have created designated Provider Care Teams.  These Care Teams include your primary Cardiologist (physician) and Advanced Practice Providers (APPs -  Physician Assistants and Nurse Practitioners) who all work together to provide you with the care you need, when you need it.  We recommend signing up for the patient portal called "MyChart".  Sign up information is provided on this After Visit Summary.  MyChart is used to connect with patients for Virtual Visits (Telemedicine).  Patients are able to view lab/test results, encounter notes, upcoming appointments, etc.  Non-urgent messages can be sent to your provider as well.   To learn more about what you can do with MyChart, go to [email or web address].    Your next appointment:   6 month(s)  Provider:   DR. Verne Carrow

## 2023-04-07 ENCOUNTER — Telehealth: Payer: Self-pay | Admitting: *Deleted

## 2023-04-07 DIAGNOSIS — Z79899 Other long term (current) drug therapy: Secondary | ICD-10-CM

## 2023-04-07 DIAGNOSIS — E875 Hyperkalemia: Secondary | ICD-10-CM

## 2023-04-07 LAB — COMPREHENSIVE METABOLIC PANEL
ALT: 15 IU/L (ref 0–44)
AST: 17 IU/L (ref 0–40)
Albumin/Globulin Ratio: 1.6 (ref 1.2–2.2)
Albumin: 3.9 g/dL (ref 3.7–4.7)
Alkaline Phosphatase: 68 IU/L (ref 44–121)
BUN/Creatinine Ratio: 10 (ref 10–24)
BUN: 14 mg/dL (ref 8–27)
Bilirubin Total: 0.6 mg/dL (ref 0.0–1.2)
CO2: 25 mmol/L (ref 20–29)
Calcium: 9.2 mg/dL (ref 8.6–10.2)
Chloride: 101 mmol/L (ref 96–106)
Creatinine, Ser: 1.45 mg/dL — ABNORMAL HIGH (ref 0.76–1.27)
Globulin, Total: 2.4 g/dL (ref 1.5–4.5)
Glucose: 97 mg/dL (ref 70–99)
Potassium: 5.4 mmol/L — ABNORMAL HIGH (ref 3.5–5.2)
Sodium: 136 mmol/L (ref 134–144)
Total Protein: 6.3 g/dL (ref 6.0–8.5)
eGFR: 48 mL/min/{1.73_m2} — ABNORMAL LOW (ref >59)

## 2023-04-07 LAB — CBC WITH DIFFERENTIAL/PLATELET
Basophils Absolute: 0 10*3/uL (ref 0.0–0.2)
Basos: 1 %
EOS (ABSOLUTE): 0.1 10*3/uL (ref 0.0–0.4)
Eos: 3 %
Hematocrit: 43.5 % (ref 37.5–51.0)
Hemoglobin: 14.7 g/dL (ref 13.0–17.7)
Immature Grans (Abs): 0 10*3/uL (ref 0.0–0.1)
Immature Granulocytes: 0 %
Lymphocytes Absolute: 1 10*3/uL (ref 0.7–3.1)
Lymphs: 21 %
MCH: 30.6 pg (ref 26.6–33.0)
MCHC: 33.8 g/dL (ref 31.5–35.7)
MCV: 91 fL (ref 79–97)
Monocytes Absolute: 0.7 10*3/uL (ref 0.1–0.9)
Monocytes: 15 %
Neutrophils Absolute: 2.9 10*3/uL (ref 1.4–7.0)
Neutrophils: 60 %
Platelets: 188 10*3/uL (ref 150–450)
RBC: 4.8 x10E6/uL (ref 4.14–5.80)
RDW: 13.1 % (ref 11.6–15.4)
WBC: 4.8 10*3/uL (ref 3.4–10.8)

## 2023-04-07 LAB — LIPID PANEL
Chol/HDL Ratio: 2.7 ratio (ref 0.0–5.0)
Cholesterol, Total: 131 mg/dL (ref 100–199)
HDL: 49 mg/dL (ref >39)
LDL Chol Calc (NIH): 65 mg/dL (ref 0–99)
Triglycerides: 86 mg/dL (ref 0–149)
VLDL Cholesterol Cal: 17 mg/dL (ref 5–40)

## 2023-04-07 LAB — HEMOGLOBIN A1C
Est. average glucose Bld gHb Est-mCnc: 120 mg/dL
Hgb A1c MFr Bld: 5.8 % — ABNORMAL HIGH (ref 4.8–5.6)

## 2023-04-07 LAB — TSH: TSH: 1.22 u[IU]/mL (ref 0.450–4.500)

## 2023-04-07 NOTE — Telephone Encounter (Signed)
The patient has been notified of the result and verbalized understanding.  All questions (if any) were answered.  Pt is aware to stop taking his potassium supplementation and come into the office for a repeat BMET in one week, to reassess his K level.   Scheduled the pt for repeat BMET on next Thursday 5/30.  Pt verbalized understanding and agrees with this plan.

## 2023-04-07 NOTE — Telephone Encounter (Signed)
-----   Message from Meriam Sprague, MD sent at 04/07/2023  8:49 AM EDT ----- His kidney function is stable. Potassium is on the high end. He can hold the potassium supplementation completely and repeat BMET next week to see where his K lies.  A1C is in the prediabetic range at 5.8. Will continue with lifestyle modifications. Blood counts look great. TSH is normal. Cholesterol looks excellent.

## 2023-04-08 DIAGNOSIS — K224 Dyskinesia of esophagus: Secondary | ICD-10-CM | POA: Diagnosis not present

## 2023-04-08 DIAGNOSIS — I10 Essential (primary) hypertension: Secondary | ICD-10-CM | POA: Diagnosis not present

## 2023-04-08 DIAGNOSIS — M519 Unspecified thoracic, thoracolumbar and lumbosacral intervertebral disc disorder: Secondary | ICD-10-CM | POA: Diagnosis not present

## 2023-04-08 DIAGNOSIS — N400 Enlarged prostate without lower urinary tract symptoms: Secondary | ICD-10-CM | POA: Diagnosis not present

## 2023-04-08 DIAGNOSIS — Z0000 Encounter for general adult medical examination without abnormal findings: Secondary | ICD-10-CM | POA: Diagnosis not present

## 2023-04-08 DIAGNOSIS — N1831 Chronic kidney disease, stage 3a: Secondary | ICD-10-CM | POA: Diagnosis not present

## 2023-04-08 DIAGNOSIS — I700 Atherosclerosis of aorta: Secondary | ICD-10-CM | POA: Diagnosis not present

## 2023-04-08 DIAGNOSIS — M1990 Unspecified osteoarthritis, unspecified site: Secondary | ICD-10-CM | POA: Diagnosis not present

## 2023-04-08 DIAGNOSIS — M109 Gout, unspecified: Secondary | ICD-10-CM | POA: Diagnosis not present

## 2023-04-08 DIAGNOSIS — G720 Drug-induced myopathy: Secondary | ICD-10-CM | POA: Diagnosis not present

## 2023-04-08 DIAGNOSIS — J309 Allergic rhinitis, unspecified: Secondary | ICD-10-CM | POA: Diagnosis not present

## 2023-04-14 ENCOUNTER — Ambulatory Visit: Payer: Medicare Other | Attending: Cardiology

## 2023-04-14 DIAGNOSIS — Z79899 Other long term (current) drug therapy: Secondary | ICD-10-CM

## 2023-04-14 DIAGNOSIS — E875 Hyperkalemia: Secondary | ICD-10-CM | POA: Diagnosis not present

## 2023-04-14 LAB — BASIC METABOLIC PANEL
BUN/Creatinine Ratio: 12 (ref 10–24)
BUN: 14 mg/dL (ref 8–27)
CO2: 23 mmol/L (ref 20–29)
Calcium: 8.8 mg/dL (ref 8.6–10.2)
Chloride: 103 mmol/L (ref 96–106)
Creatinine, Ser: 1.19 mg/dL (ref 0.76–1.27)
Glucose: 82 mg/dL (ref 70–99)
Potassium: 3.9 mmol/L (ref 3.5–5.2)
Sodium: 139 mmol/L (ref 134–144)
eGFR: 61 mL/min/{1.73_m2} (ref >59)

## 2023-04-15 ENCOUNTER — Telehealth: Payer: Self-pay | Admitting: *Deleted

## 2023-04-15 ENCOUNTER — Telehealth: Payer: Self-pay

## 2023-04-15 MED ORDER — POTASSIUM CHLORIDE CRYS ER 10 MEQ PO TBCR: 10.0000 meq | EXTENDED_RELEASE_TABLET | Freq: Every day | ORAL | 1 refills | Status: AC

## 2023-04-15 NOTE — Telephone Encounter (Signed)
Pt called regarding potassium dosage. Pt is aware of the changes, but is worried that his potassium will drop too low, and wants to know if he should be taking more than 10 meq daily. Pt stated he can be reached at [phone]. Please address. Thank you.

## 2023-04-15 NOTE — Telephone Encounter (Signed)
Pt education provided about his KDUR dosing of 10 mEq po daily.    Also went over the result and recommendation per Dr. Shari Prows as well.   Pt verbalized understanding and agrees with this plan.   Loa Socks, LPN 1/61/0960  4:54 AM EDT Back to Top    The patient has been notified of the result and verbalized understanding.  All questions (if any) were answered.   Pt asked how he should proceed with taking his KDUR regimen, being he held this for the last week as instructed by Dr. Shari Prows.   Per Dr. Shari Prows, pt needs to decrease his KDUR to taking 10 mEq po daily.   Endorsed to the pt that we will now decrease her KDUR to 10 mEq po daily.   Confirmed the pharmacy of choice with the pt. Pt states he has plenty on hand at this time and will reach out to the pharmacy for refills of this medication when needed.  Note placed to the pharmacy about this.   Pt verbalized understanding and agrees with this plan.   Meriam Sprague, MD 04/15/2023  6:49 AM EDT     Kidney function and electrolytes look great. K is better at 3.9

## 2023-04-15 NOTE — Telephone Encounter (Signed)
-----   Message from Meriam Sprague, MD sent at 04/15/2023  6:49 AM EDT ----- Kidney function and electrolytes look great. K is better at 3.9

## 2023-04-15 NOTE — Telephone Encounter (Signed)
The patient has been notified of the result and verbalized understanding.  All questions (if any) were answered.  Pt asked how he should proceed with taking his KDUR regimen, being he held this for the last week as instructed by Dr. Shari Prows.  Per Dr. Shari Prows, pt needs to decrease his KDUR to taking 10 mEq po daily.  Endorsed to the pt that we will now decrease her KDUR to 10 mEq po daily.  Confirmed the pharmacy of choice with the pt. Pt states he has plenty on hand at this time and will reach out to the pharmacy for refills of this medication when needed.  Note placed to the pharmacy about this.  Pt verbalized understanding and agrees with this plan.

## 2023-04-18 DIAGNOSIS — K219 Gastro-esophageal reflux disease without esophagitis: Secondary | ICD-10-CM | POA: Diagnosis not present

## 2023-04-18 DIAGNOSIS — R1314 Dysphagia, pharyngoesophageal phase: Secondary | ICD-10-CM | POA: Diagnosis not present

## 2023-04-29 ENCOUNTER — Ambulatory Visit (INDEPENDENT_AMBULATORY_CARE_PROVIDER_SITE_OTHER): Payer: Medicare Other | Admitting: Podiatry

## 2023-04-29 ENCOUNTER — Encounter: Payer: Self-pay | Admitting: Podiatry

## 2023-04-29 DIAGNOSIS — L600 Ingrowing nail: Secondary | ICD-10-CM | POA: Diagnosis not present

## 2023-04-29 MED ORDER — DOXYCYCLINE HYCLATE 100 MG PO TABS: 100.0000 mg | ORAL_TABLET | Freq: Two times a day (BID) | ORAL | 0 refills | Status: DC

## 2023-04-29 NOTE — Progress Notes (Unsigned)
gra 

## 2023-04-29 NOTE — Patient Instructions (Signed)

## 2023-05-11 DIAGNOSIS — M25512 Pain in left shoulder: Secondary | ICD-10-CM | POA: Diagnosis not present

## 2023-05-20 DIAGNOSIS — M25512 Pain in left shoulder: Secondary | ICD-10-CM | POA: Diagnosis not present

## 2023-05-27 ENCOUNTER — Telehealth: Payer: Self-pay | Admitting: *Deleted

## 2023-05-27 DIAGNOSIS — M25512 Pain in left shoulder: Secondary | ICD-10-CM | POA: Diagnosis not present

## 2023-05-27 NOTE — Telephone Encounter (Signed)
   [facility] Pre-operative Risk Assessment    Patient Name: CHEMAR MERRITHEW  DOB: 04-Sep-1941 MRN: [id number]  HEARTCARE STAFF:  - IMPORTANT!!!!!! Under Visit Info/Reason for Call, type in Other and utilize the format Clearance MM/DD/YY or Clearance TBD. Do not use dashes or single digits. - Please review there is not already an duplicate clearance open for this procedure. - If request is for dental extraction, please clarify the # of teeth to be extracted. - If the patient is currently at the dentist's office, call Pre-Op Callback Staff (MA/nurse) to input urgent request.  - If the patient is not currently in the dentist office, please route to the Pre-Op pool.  Request for surgical clearance:  What type of surgery is being performed? Left shoulder arthroscopy and rotator cuff repair  When is this surgery scheduled? 06/16/2023  What type of clearance is required (medical clearance vs. Pharmacy clearance to hold med vs. Both)? Both  Are there any medications that need to be held prior to surgery and how long? None requested, pt on plavix  Practice name and name of physician performing surgery? [facility], Dr Jerl Santos  What is the office phone number? [phone]   7.   What is the office fax number? [phone] Attn Sharyn Blitz  8.   Anesthesia type (None, local, MAC, general) ? General   Valory Wetherby L 05/27/2023, 12:24 PM  _________________________________________________________________   (provider comments below)

## 2023-05-27 NOTE — Telephone Encounter (Signed)
   Patient Name: Bob Elliott  DOB: 05-Apr-1941 MRN: [id number]  Primary Cardiologist: Meriam Sprague, MD  Chart reviewed as part of pre-operative protocol coverage. Pre-op clearance already addressed by colleagues in earlier phone notes. To summarize recommendations:  -Okay to hold the plavix. No further testing needed prior to surgery. Was active and able to do >4METs.  -Dr. Shari Prows  Will route this bundled recommendation to requesting provider via Epic fax function and remove from pre-op pool. Please call with questions.  Sharlene Dory, PA-C 05/27/2023, 1:58 PM

## 2023-05-31 DIAGNOSIS — H52203 Unspecified astigmatism, bilateral: Secondary | ICD-10-CM | POA: Diagnosis not present

## 2023-05-31 DIAGNOSIS — H2512 Age-related nuclear cataract, left eye: Secondary | ICD-10-CM | POA: Diagnosis not present

## 2023-05-31 DIAGNOSIS — H401131 Primary open-angle glaucoma, bilateral, mild stage: Secondary | ICD-10-CM | POA: Diagnosis not present

## 2023-06-15 NOTE — Telephone Encounter (Signed)
REQUEST CAME OVER FROM [facility] ASKING FOR NOTES TO BE FAXED TO THE ANESTHESIOLOGIST AS WELL.   NOTES HAVE BEEN FAXED TO [phone]

## 2023-06-16 ENCOUNTER — Telehealth: Payer: Self-pay | Admitting: Cardiology

## 2023-06-16 ENCOUNTER — Other Ambulatory Visit: Payer: Self-pay

## 2023-06-16 DIAGNOSIS — M75122 Complete rotator cuff tear or rupture of left shoulder, not specified as traumatic: Secondary | ICD-10-CM | POA: Diagnosis not present

## 2023-06-16 DIAGNOSIS — R079 Chest pain, unspecified: Secondary | ICD-10-CM

## 2023-06-16 DIAGNOSIS — G8918 Other acute postprocedural pain: Secondary | ICD-10-CM | POA: Diagnosis not present

## 2023-06-16 DIAGNOSIS — R0609 Other forms of dyspnea: Secondary | ICD-10-CM

## 2023-06-16 DIAGNOSIS — X58XXXA Exposure to other specified factors, initial encounter: Secondary | ICD-10-CM | POA: Diagnosis not present

## 2023-06-16 DIAGNOSIS — M19012 Primary osteoarthritis, left shoulder: Secondary | ICD-10-CM | POA: Diagnosis not present

## 2023-06-16 DIAGNOSIS — E785 Hyperlipidemia, unspecified: Secondary | ICD-10-CM

## 2023-06-16 DIAGNOSIS — M7522 Bicipital tendinitis, left shoulder: Secondary | ICD-10-CM | POA: Diagnosis not present

## 2023-06-16 DIAGNOSIS — M7582 Other shoulder lesions, left shoulder: Secondary | ICD-10-CM | POA: Diagnosis not present

## 2023-06-16 DIAGNOSIS — Y999 Unspecified external cause status: Secondary | ICD-10-CM | POA: Diagnosis not present

## 2023-06-16 DIAGNOSIS — S46112A Strain of muscle, fascia and tendon of long head of biceps, left arm, initial encounter: Secondary | ICD-10-CM | POA: Diagnosis not present

## 2023-06-16 MED ORDER — CLOPIDOGREL BISULFATE 75 MG PO TABS: 75.0000 mg | ORAL_TABLET | Freq: Every day | ORAL | 2 refills | Status: DC

## 2023-06-16 NOTE — Telephone Encounter (Signed)
  Darl Pikes from the Anesthesiology Department called and mentioned that the anesthesiologist requires the date of the patient's most recent stroke. It appears they were not aware that the patient had experienced a stroke, and they need this information as soon as possible, given that the his surgery is scheduled for today.

## 2023-06-16 NOTE — Telephone Encounter (Signed)
I spoke with Darl Pikes with Anesthesia in great depth... I could only find evidence of a possible TIA in 09/2019 and I sent her the notes from the OV during that time and his ED visit and notes from Neuro.   Fax number [phone].

## 2023-06-16 NOTE — Telephone Encounter (Signed)
Triage nurse Rudene Anda RN came to me about the anesthesiologist was wanting to know when the pt's stroke was. RN and I reviewed the chart and found notes from Ihor Austin, NP: ASSESSMENT: Bob Elliott is a 82 y.o. year old male presented with transient right eye vision loss on 09/19/2019 likely secondary to right amaurosis fugax. Vascular risk factors include HTN, HLD, OSA and aortic aneurysm.  Has been stable from a neurological standpoint without reoccurring visual loss or stroke/TIA symptoms.

## 2023-07-12 DIAGNOSIS — E876 Hypokalemia: Secondary | ICD-10-CM | POA: Diagnosis not present

## 2023-07-12 DIAGNOSIS — R252 Cramp and spasm: Secondary | ICD-10-CM | POA: Diagnosis not present

## 2023-07-12 DIAGNOSIS — R600 Localized edema: Secondary | ICD-10-CM | POA: Diagnosis not present

## 2023-07-14 DIAGNOSIS — M25612 Stiffness of left shoulder, not elsewhere classified: Secondary | ICD-10-CM | POA: Diagnosis not present

## 2023-07-14 DIAGNOSIS — R531 Weakness: Secondary | ICD-10-CM | POA: Diagnosis not present

## 2023-07-14 DIAGNOSIS — M75102 Unspecified rotator cuff tear or rupture of left shoulder, not specified as traumatic: Secondary | ICD-10-CM | POA: Diagnosis not present

## 2023-07-19 DIAGNOSIS — Z8639 Personal history of other endocrine, nutritional and metabolic disease: Secondary | ICD-10-CM | POA: Diagnosis not present

## 2023-07-19 DIAGNOSIS — R531 Weakness: Secondary | ICD-10-CM | POA: Diagnosis not present

## 2023-07-19 DIAGNOSIS — R972 Elevated prostate specific antigen [PSA]: Secondary | ICD-10-CM | POA: Diagnosis not present

## 2023-07-19 DIAGNOSIS — M75102 Unspecified rotator cuff tear or rupture of left shoulder, not specified as traumatic: Secondary | ICD-10-CM | POA: Diagnosis not present

## 2023-07-19 DIAGNOSIS — M25612 Stiffness of left shoulder, not elsewhere classified: Secondary | ICD-10-CM | POA: Diagnosis not present

## 2023-07-21 DIAGNOSIS — R531 Weakness: Secondary | ICD-10-CM | POA: Diagnosis not present

## 2023-07-21 DIAGNOSIS — M75102 Unspecified rotator cuff tear or rupture of left shoulder, not specified as traumatic: Secondary | ICD-10-CM | POA: Diagnosis not present

## 2023-07-21 DIAGNOSIS — M25612 Stiffness of left shoulder, not elsewhere classified: Secondary | ICD-10-CM | POA: Diagnosis not present

## 2023-07-27 DIAGNOSIS — M75102 Unspecified rotator cuff tear or rupture of left shoulder, not specified as traumatic: Secondary | ICD-10-CM | POA: Diagnosis not present

## 2023-07-27 DIAGNOSIS — R531 Weakness: Secondary | ICD-10-CM | POA: Diagnosis not present

## 2023-07-27 DIAGNOSIS — M25612 Stiffness of left shoulder, not elsewhere classified: Secondary | ICD-10-CM | POA: Diagnosis not present

## 2023-07-29 DIAGNOSIS — M25612 Stiffness of left shoulder, not elsewhere classified: Secondary | ICD-10-CM | POA: Diagnosis not present

## 2023-07-29 DIAGNOSIS — R531 Weakness: Secondary | ICD-10-CM | POA: Diagnosis not present

## 2023-07-29 DIAGNOSIS — M75102 Unspecified rotator cuff tear or rupture of left shoulder, not specified as traumatic: Secondary | ICD-10-CM | POA: Diagnosis not present

## 2023-08-01 DIAGNOSIS — M75102 Unspecified rotator cuff tear or rupture of left shoulder, not specified as traumatic: Secondary | ICD-10-CM | POA: Diagnosis not present

## 2023-08-01 DIAGNOSIS — M25612 Stiffness of left shoulder, not elsewhere classified: Secondary | ICD-10-CM | POA: Diagnosis not present

## 2023-08-01 DIAGNOSIS — R531 Weakness: Secondary | ICD-10-CM | POA: Diagnosis not present

## 2023-08-03 DIAGNOSIS — R531 Weakness: Secondary | ICD-10-CM | POA: Diagnosis not present

## 2023-08-03 DIAGNOSIS — M75102 Unspecified rotator cuff tear or rupture of left shoulder, not specified as traumatic: Secondary | ICD-10-CM | POA: Diagnosis not present

## 2023-08-03 DIAGNOSIS — M25612 Stiffness of left shoulder, not elsewhere classified: Secondary | ICD-10-CM | POA: Diagnosis not present

## 2023-08-08 DIAGNOSIS — M75102 Unspecified rotator cuff tear or rupture of left shoulder, not specified as traumatic: Secondary | ICD-10-CM | POA: Diagnosis not present

## 2023-08-08 DIAGNOSIS — R531 Weakness: Secondary | ICD-10-CM | POA: Diagnosis not present

## 2023-08-08 DIAGNOSIS — M25612 Stiffness of left shoulder, not elsewhere classified: Secondary | ICD-10-CM | POA: Diagnosis not present

## 2023-08-10 DIAGNOSIS — M25612 Stiffness of left shoulder, not elsewhere classified: Secondary | ICD-10-CM | POA: Diagnosis not present

## 2023-08-10 DIAGNOSIS — M75102 Unspecified rotator cuff tear or rupture of left shoulder, not specified as traumatic: Secondary | ICD-10-CM | POA: Diagnosis not present

## 2023-08-10 DIAGNOSIS — R531 Weakness: Secondary | ICD-10-CM | POA: Diagnosis not present

## 2023-08-15 DIAGNOSIS — M25612 Stiffness of left shoulder, not elsewhere classified: Secondary | ICD-10-CM | POA: Diagnosis not present

## 2023-08-15 DIAGNOSIS — M75102 Unspecified rotator cuff tear or rupture of left shoulder, not specified as traumatic: Secondary | ICD-10-CM | POA: Diagnosis not present

## 2023-08-15 DIAGNOSIS — R531 Weakness: Secondary | ICD-10-CM | POA: Diagnosis not present

## 2023-08-16 DIAGNOSIS — Z23 Encounter for immunization: Secondary | ICD-10-CM | POA: Diagnosis not present

## 2023-08-17 DIAGNOSIS — M25612 Stiffness of left shoulder, not elsewhere classified: Secondary | ICD-10-CM | POA: Diagnosis not present

## 2023-08-17 DIAGNOSIS — M75102 Unspecified rotator cuff tear or rupture of left shoulder, not specified as traumatic: Secondary | ICD-10-CM | POA: Diagnosis not present

## 2023-08-17 DIAGNOSIS — R531 Weakness: Secondary | ICD-10-CM | POA: Diagnosis not present

## 2023-08-18 DIAGNOSIS — M5431 Sciatica, right side: Secondary | ICD-10-CM | POA: Diagnosis not present

## 2023-08-18 DIAGNOSIS — M5432 Sciatica, left side: Secondary | ICD-10-CM | POA: Diagnosis not present

## 2023-08-18 DIAGNOSIS — M519 Unspecified thoracic, thoracolumbar and lumbosacral intervertebral disc disorder: Secondary | ICD-10-CM | POA: Diagnosis not present

## 2023-08-22 DIAGNOSIS — M25612 Stiffness of left shoulder, not elsewhere classified: Secondary | ICD-10-CM | POA: Diagnosis not present

## 2023-08-22 DIAGNOSIS — M75102 Unspecified rotator cuff tear or rupture of left shoulder, not specified as traumatic: Secondary | ICD-10-CM | POA: Diagnosis not present

## 2023-08-22 DIAGNOSIS — R531 Weakness: Secondary | ICD-10-CM | POA: Diagnosis not present

## 2023-08-25 DIAGNOSIS — R531 Weakness: Secondary | ICD-10-CM | POA: Diagnosis not present

## 2023-08-25 DIAGNOSIS — M25551 Pain in right hip: Secondary | ICD-10-CM | POA: Diagnosis not present

## 2023-08-25 DIAGNOSIS — M25612 Stiffness of left shoulder, not elsewhere classified: Secondary | ICD-10-CM | POA: Diagnosis not present

## 2023-08-25 DIAGNOSIS — M75102 Unspecified rotator cuff tear or rupture of left shoulder, not specified as traumatic: Secondary | ICD-10-CM | POA: Diagnosis not present

## 2023-08-25 DIAGNOSIS — M545 Low back pain, unspecified: Secondary | ICD-10-CM | POA: Diagnosis not present

## 2023-08-29 DIAGNOSIS — R531 Weakness: Secondary | ICD-10-CM | POA: Diagnosis not present

## 2023-08-29 DIAGNOSIS — M25612 Stiffness of left shoulder, not elsewhere classified: Secondary | ICD-10-CM | POA: Diagnosis not present

## 2023-08-29 DIAGNOSIS — M75102 Unspecified rotator cuff tear or rupture of left shoulder, not specified as traumatic: Secondary | ICD-10-CM | POA: Diagnosis not present

## 2023-08-31 ENCOUNTER — Other Ambulatory Visit: Payer: Self-pay

## 2023-08-31 DIAGNOSIS — Z79899 Other long term (current) drug therapy: Secondary | ICD-10-CM

## 2023-08-31 DIAGNOSIS — E785 Hyperlipidemia, unspecified: Secondary | ICD-10-CM

## 2023-08-31 DIAGNOSIS — I10 Essential (primary) hypertension: Secondary | ICD-10-CM

## 2023-08-31 MED ORDER — AMLODIPINE BESYLATE 5 MG PO TABS: 5.0000 mg | ORAL_TABLET | Freq: Every day | ORAL | 0 refills | Status: DC

## 2023-09-01 DIAGNOSIS — M25612 Stiffness of left shoulder, not elsewhere classified: Secondary | ICD-10-CM | POA: Diagnosis not present

## 2023-09-01 DIAGNOSIS — M75102 Unspecified rotator cuff tear or rupture of left shoulder, not specified as traumatic: Secondary | ICD-10-CM | POA: Diagnosis not present

## 2023-09-01 DIAGNOSIS — R531 Weakness: Secondary | ICD-10-CM | POA: Diagnosis not present

## 2023-09-05 DIAGNOSIS — R350 Frequency of micturition: Secondary | ICD-10-CM | POA: Diagnosis not present

## 2023-09-05 DIAGNOSIS — M25612 Stiffness of left shoulder, not elsewhere classified: Secondary | ICD-10-CM | POA: Diagnosis not present

## 2023-09-05 DIAGNOSIS — R972 Elevated prostate specific antigen [PSA]: Secondary | ICD-10-CM | POA: Diagnosis not present

## 2023-09-05 DIAGNOSIS — R531 Weakness: Secondary | ICD-10-CM | POA: Diagnosis not present

## 2023-09-05 DIAGNOSIS — M75102 Unspecified rotator cuff tear or rupture of left shoulder, not specified as traumatic: Secondary | ICD-10-CM | POA: Diagnosis not present

## 2023-09-05 DIAGNOSIS — N401 Enlarged prostate with lower urinary tract symptoms: Secondary | ICD-10-CM | POA: Diagnosis not present

## 2023-09-08 DIAGNOSIS — R531 Weakness: Secondary | ICD-10-CM | POA: Diagnosis not present

## 2023-09-08 DIAGNOSIS — M25612 Stiffness of left shoulder, not elsewhere classified: Secondary | ICD-10-CM | POA: Diagnosis not present

## 2023-09-08 DIAGNOSIS — M75102 Unspecified rotator cuff tear or rupture of left shoulder, not specified as traumatic: Secondary | ICD-10-CM | POA: Diagnosis not present

## 2023-09-12 DIAGNOSIS — M48061 Spinal stenosis, lumbar region without neurogenic claudication: Secondary | ICD-10-CM | POA: Diagnosis not present

## 2023-09-13 DIAGNOSIS — Z23 Encounter for immunization: Secondary | ICD-10-CM | POA: Diagnosis not present

## 2023-09-14 DIAGNOSIS — M75102 Unspecified rotator cuff tear or rupture of left shoulder, not specified as traumatic: Secondary | ICD-10-CM | POA: Diagnosis not present

## 2023-09-14 DIAGNOSIS — R531 Weakness: Secondary | ICD-10-CM | POA: Diagnosis not present

## 2023-09-14 DIAGNOSIS — M25612 Stiffness of left shoulder, not elsewhere classified: Secondary | ICD-10-CM | POA: Diagnosis not present

## 2023-09-19 DIAGNOSIS — M48061 Spinal stenosis, lumbar region without neurogenic claudication: Secondary | ICD-10-CM | POA: Diagnosis not present

## 2023-09-21 DIAGNOSIS — R531 Weakness: Secondary | ICD-10-CM | POA: Diagnosis not present

## 2023-09-21 DIAGNOSIS — M25612 Stiffness of left shoulder, not elsewhere classified: Secondary | ICD-10-CM | POA: Diagnosis not present

## 2023-09-21 DIAGNOSIS — M75102 Unspecified rotator cuff tear or rupture of left shoulder, not specified as traumatic: Secondary | ICD-10-CM | POA: Diagnosis not present

## 2023-09-22 DIAGNOSIS — M48061 Spinal stenosis, lumbar region without neurogenic claudication: Secondary | ICD-10-CM | POA: Diagnosis not present

## 2023-09-25 NOTE — Progress Notes (Unsigned)
No chief complaint on file.   History of Present Illness: 82 yo male with history of thoracic aortic aneurysm, HTN, HLD, CVA, esophageal stricture and amaurosis fugax who is here today for follow up. He has been followed in our office by Dr. Shari Prows. has a history of amaurosis fugax and underwent cardiac monitor which showed sinus with short runs NSVT and no Afib. Myoview in Apirl 2021 with no ischemia. He has a thoracic aortic aneurysm followed in the CT surgery office.   He has chronic dyspnea. He is followed in GI for a Schatzi's rinig.   Primary Care Physician: Georgann Housekeeper, MD   Past Medical History:  Diagnosis Date   Arthritis    BPH (benign prostatic hyperplasia)    CKD (chronic kidney disease), stage II    a. CKD II-III by labs.   Dizziness 10/21/2016   Esophageal dysmotility    Essential hypertension    Trial off acei 08/12/2017 for unexplained sob/ noct smothering    GERD (gastroesophageal reflux disease)    GI bleed    Glaucoma    BOTH EYES   Hyperlipidemia    Hypertension    Inguinal hernia without mention of obstruction or gangrene, unilateral or unspecified, (not specified as recurrent)-right s/p repair with mesh 05/21/2014   Schatzki's ring    Sinus bradycardia    a. prompting dc of Diltiazem (?chronotropic incompetence) in 2017.   Sleep apnea    "mild" no cpap   Thoracic aortic aneurysm (TAA) (HCC)     Past Surgical History:  Procedure Laterality Date   COLONOSCOPY WITH PROPOFOL N/A 05/11/2016   Procedure: COLONOSCOPY WITH PROPOFOL;  Surgeon: Charolett Bumpers, MD;  Location: [facility];  Service: Endoscopy;  Laterality: N/A;   EYE SURGERY     CATARACT R  EYE   HERNIA REPAIR     UMBILICAL   INGUINAL HERNIA REPAIR Right 06/18/2014   Procedure: RIGHT INGUINAL HERNIA REPAIR;  Surgeon: Adolph Pollack, MD;  Location: [facility];  Service: General;  Laterality: Right;   INSERTION OF MESH Right 06/18/2014   Procedure: INSERTION OF MESH;  Surgeon: Adolph Pollack,  MD;  Location: [facility] OR;  Service: General;  Laterality: Right;   NECK SURGERY  2000   cervical disc surgery(limited ROM)- retained hardware   TONSILLECTOMY      Current Outpatient Medications  Medication Sig Dispense Refill   potassium chloride (KLOR-CON M) 10 MEQ tablet Take 1 tablet (10 mEq total) by mouth daily. 90 tablet 1   amLODipine (NORVASC) 5 MG tablet Take 1 tablet (5 mg total) by mouth daily. 90 tablet 0   clopidogrel (PLAVIX) 75 MG tablet Take 1 tablet (75 mg total) by mouth daily. 90 tablet 2   clotrimazole-betamethasone (LOTRISONE) cream Apply 1 Application topically 2 (two) times daily.     doxazosin (CARDURA) 4 MG tablet Take 4 mg by mouth at bedtime.      doxycycline (VIBRA-TABS) 100 MG tablet Take 1 tablet (100 mg total) by mouth 2 (two) times daily. 14 tablet 0   esomeprazole (NEXIUM) 40 MG capsule Take 40 mg by mouth 2 (two) times daily.     Evolocumab (REPATHA SURECLICK) 140 MG/ML SOAJ Inject 140 mg into the skin every 14 (fourteen) days. 2 mL 11   fluticasone (FLONASE) 50 MCG/ACT nasal spray Place 2 sprays into both nostrils daily as needed for allergies or rhinitis.      guaiFENesin-dextromethorphan (ROBITUSSIN DM) 100-10 MG/5ML syrup Take 5 mLs by mouth 3 (three)  times daily as needed for cough. 118 mL 0   irbesartan (AVAPRO) 150 MG tablet TAKE 1 TABLET DAILY (DOSE DECREASE) 90 tablet 3   loratadine (CLARITIN) 10 MG tablet Take 10 mg by mouth daily.     solifenacin (VESICARE) 5 MG tablet Take 5 mg by mouth as needed.     spironolactone (ALDACTONE) 25 MG tablet Take 0.5 tablets (12.5 mg total) by mouth daily. 45 tablet 3   timolol (TIMOPTIC) 0.5 % ophthalmic solution Place 1 drop into both eyes daily.   4   No current facility-administered medications for this visit.    Allergies  Allergen Reactions   Aspirin Hives, Itching and Nausea Only    Other reaction(s): GI Upset   Codeine Hives, Itching, Nausea Only and Other (See Comments)    Dizzy spells   Pravastatin  Sodium Other (See Comments)    PT C/O MYALGIAS AND JOINT PAIN   Shellfish Allergy Hives   Amitriptyline     Other reaction(s): Intolerant   Atorvastatin     Myalgias on 10-40mg  dosing   Gabapentin     Other reaction(s): Intolerant   Other     Other reaction(s): stomach upset   Repatha [Evolocumab]     Fatigue, muscle pain   Rosuvastatin     Myalgias on 5mg  3x/week   Tramadol Hcl     Other reaction(s): grogginess   Zetia [Ezetimibe] Other (See Comments)    Pt reports causes joint and muscle aches    Social History   Socioeconomic History   Marital status: Married    Spouse name: Not on file   Number of children: 4   Years of education: Not on file   Highest education level: Not on file  Occupational History   Occupation: retired  Tobacco Use   Smoking status: Never   Smokeless tobacco: Never  Vaping Use   Vaping status: Never Used  Substance and Sexual Activity   Alcohol use: Yes    Comment: none since 80's   Drug use: No   Sexual activity: Not Currently  Other Topics Concern   Not on file  Social History Narrative   Not on file   Social Determinants of Health   Financial Resource Strain: Not on file  Food Insecurity: Low Risk  (04/18/2023)   [facility]   Hunger Vital Sign    Worried About Running Out of Food in the Last Year: Never true    Ran Out of Food in the Last Year: Never true  Transportation Needs: No Transportation Needs (04/18/2023)   [facility]   Transportation    In the past 12 months, has lack of reliable transportation kept you from medical appointments, meetings, work or from getting things needed for daily living? : No  Physical Activity: Not on file  Stress: Not on file  Social Connections: Not on file  Intimate Partner Violence: Not on file    Family History  Problem Relation Age of Onset   Heart disease Mother    Stroke Father    Cancer Brother        colon   Diabetes  Sister    Leukemia Maternal Grandmother     Review of Systems:  As stated in the HPI and otherwise negative.   There were no vitals taken for this visit.  Physical Examination: General: Well developed, well nourished, NAD  HEENT: OP clear, mucus membranes moist  SKIN: warm, dry. No rashes.  Neuro: No focal deficits  Musculoskeletal: Muscle strength 5/5 all ext  Psychiatric: Mood and affect normal  Neck: No JVD, no carotid bruits, no thyromegaly, no lymphadenopathy.  Lungs:Clear bilaterally, no wheezes, rhonci, crackles Cardiovascular: Regular rate and rhythm. No murmurs, gallops or rubs. Abdomen:Soft. Bowel sounds present. Non-tender.  Extremities: No lower extremity edema. Pulses are 2 + in the bilateral DP/PT.  EKG:  EKG {ACTION; IS/IS [id number]} ordered today. The ekg ordered today demonstrates ***  Recent Labs: 04/06/2023: ALT 15; Hemoglobin 14.7; Platelets 188; TSH 1.220 04/14/2023: BUN 14; Creatinine, Ser 1.19; Potassium 3.9; Sodium 139   Lipid Panel    Component Value Date/Time   CHOL 131 04/06/2023 1227   TRIG 86 04/06/2023 1227   HDL 49 04/06/2023 1227   CHOLHDL 2.7 04/06/2023 1227   CHOLHDL 4.0 09/20/2019 0740   VLDL 19 09/20/2019 0740   LDLCALC 65 04/06/2023 1227     Wt Readings from Last 3 Encounters:  04/06/23 92.8 kg  01/03/23 94.3 kg  10/14/22 95.7 kg      Assessment and Plan:   1.   Labs/ tests ordered today include:  No orders of the defined types were placed in this encounter.    Disposition:   F/U with me in ***    Signed, Verne Carrow, MD, [facility] 09/25/2023 12:08 PM    [facility] [address] [place], [place], [place]  [address] Phone: [phone]; Fax: [phone]

## 2023-09-26 ENCOUNTER — Encounter: Payer: Self-pay | Admitting: Cardiovascular Disease

## 2023-09-26 ENCOUNTER — Ambulatory Visit: Payer: Medicare Other | Attending: Cardiovascular Disease | Admitting: Cardiovascular Disease

## 2023-09-26 VITALS — BP 140/68 | HR 67 | Wt 198.6 lb

## 2023-09-26 DIAGNOSIS — I712 Thoracic aortic aneurysm, without rupture, unspecified: Secondary | ICD-10-CM | POA: Insufficient documentation

## 2023-09-26 DIAGNOSIS — I351 Nonrheumatic aortic (valve) insufficiency: Secondary | ICD-10-CM | POA: Insufficient documentation

## 2023-09-26 DIAGNOSIS — M48061 Spinal stenosis, lumbar region without neurogenic claudication: Secondary | ICD-10-CM | POA: Diagnosis not present

## 2023-09-26 DIAGNOSIS — M79604 Pain in right leg: Secondary | ICD-10-CM | POA: Diagnosis not present

## 2023-09-26 DIAGNOSIS — I10 Essential (primary) hypertension: Secondary | ICD-10-CM | POA: Insufficient documentation

## 2023-09-26 DIAGNOSIS — E782 Mixed hyperlipidemia: Secondary | ICD-10-CM | POA: Insufficient documentation

## 2023-09-26 DIAGNOSIS — M79605 Pain in left leg: Secondary | ICD-10-CM | POA: Diagnosis not present

## 2023-09-26 NOTE — Patient Instructions (Signed)
Medication Instructions:  No changes *If you need a refill on your cardiac medications before your next appointment, please call your pharmacy*   Lab Work: none If you have labs (blood work) drawn today and your tests are completely normal, you will receive your results only by: MyChart Message (if you have MyChart) OR A paper copy in the mail If you have any lab test that is abnormal or we need to change your treatment, we will call you to review the results.   Testing/Procedures: Your physician has requested that you have an echocardiogram. Echocardiography is a painless test that uses sound waves to create images of your heart. It provides your doctor with information about the size and shape of your heart and how well your heart's chambers and valves are working. This procedure takes approximately one hour. There are no restrictions for this procedure. Please do NOT wear cologne, perfume, aftershave, or lotions (deodorant is allowed). Please arrive 15 minutes prior to your appointment time.  Please note: We ask at that you not bring children with you during ultrasound (echo/ vascular) testing. Due to room size and safety concerns, children are not allowed in the ultrasound rooms during exams. Our front office staff cannot provide observation of children in our lobby area while testing is being conducted. An adult accompanying a patient to their appointment will only be allowed in the ultrasound room at the discretion of the ultrasound technician under special circumstances. We apologize for any inconvenience.  Your physician has requested that you have an ankle brachial index (ABI). During this test an ultrasound and blood pressure cuff are used to evaluate the arteries that supply the arms and legs with blood. Allow thirty minutes for this exam. There are no restrictions or special instructions.  Please note: We ask at that you not bring children with you during ultrasound (echo/ vascular)  testing. Due to room size and safety concerns, children are not allowed in the ultrasound rooms during exams. Our front office staff cannot provide observation of children in our lobby area while testing is being conducted. An adult accompanying a patient to their appointment will only be allowed in the ultrasound room at the discretion of the ultrasound technician under special circumstances. We apologize for any inconvenience.    Follow-Up: At [facility], you and your health needs are our priority.  As part of our continuing mission to provide you with exceptional heart care, we have created designated Provider Care Teams.  These Care Teams include your primary Cardiologist (physician) and Advanced Practice Providers (APPs -  Physician Assistants and Nurse Practitioners) who all work together to provide you with the care you need, when you need it.   Your next appointment:   12 month(s)  Provider:   Verne Carrow, MD

## 2023-09-28 DIAGNOSIS — M75102 Unspecified rotator cuff tear or rupture of left shoulder, not specified as traumatic: Secondary | ICD-10-CM | POA: Diagnosis not present

## 2023-09-28 DIAGNOSIS — M25612 Stiffness of left shoulder, not elsewhere classified: Secondary | ICD-10-CM | POA: Diagnosis not present

## 2023-09-28 DIAGNOSIS — R531 Weakness: Secondary | ICD-10-CM | POA: Diagnosis not present

## 2023-10-05 DIAGNOSIS — R531 Weakness: Secondary | ICD-10-CM | POA: Diagnosis not present

## 2023-10-05 DIAGNOSIS — M25612 Stiffness of left shoulder, not elsewhere classified: Secondary | ICD-10-CM | POA: Diagnosis not present

## 2023-10-05 DIAGNOSIS — M75102 Unspecified rotator cuff tear or rupture of left shoulder, not specified as traumatic: Secondary | ICD-10-CM | POA: Diagnosis not present

## 2023-10-10 ENCOUNTER — Ambulatory Visit (HOSPITAL_BASED_OUTPATIENT_CLINIC_OR_DEPARTMENT_OTHER): Payer: Medicare Other

## 2023-10-10 DIAGNOSIS — I351 Nonrheumatic aortic (valve) insufficiency: Secondary | ICD-10-CM | POA: Insufficient documentation

## 2023-10-10 DIAGNOSIS — M79605 Pain in left leg: Secondary | ICD-10-CM | POA: Insufficient documentation

## 2023-10-10 DIAGNOSIS — Z8679 Personal history of other diseases of the circulatory system: Secondary | ICD-10-CM | POA: Diagnosis not present

## 2023-10-10 DIAGNOSIS — Z79899 Other long term (current) drug therapy: Secondary | ICD-10-CM | POA: Diagnosis not present

## 2023-10-10 DIAGNOSIS — M47812 Spondylosis without myelopathy or radiculopathy, cervical region: Secondary | ICD-10-CM | POA: Diagnosis not present

## 2023-10-10 DIAGNOSIS — R7303 Prediabetes: Secondary | ICD-10-CM | POA: Diagnosis not present

## 2023-10-10 DIAGNOSIS — E782 Mixed hyperlipidemia: Secondary | ICD-10-CM | POA: Diagnosis not present

## 2023-10-10 DIAGNOSIS — M79604 Pain in right leg: Secondary | ICD-10-CM | POA: Diagnosis not present

## 2023-10-10 DIAGNOSIS — E876 Hypokalemia: Secondary | ICD-10-CM | POA: Diagnosis not present

## 2023-10-10 DIAGNOSIS — I712 Thoracic aortic aneurysm, without rupture, unspecified: Secondary | ICD-10-CM | POA: Diagnosis not present

## 2023-10-10 DIAGNOSIS — G720 Drug-induced myopathy: Secondary | ICD-10-CM | POA: Diagnosis not present

## 2023-10-10 DIAGNOSIS — M519 Unspecified thoracic, thoracolumbar and lumbosacral intervertebral disc disorder: Secondary | ICD-10-CM | POA: Diagnosis not present

## 2023-10-10 DIAGNOSIS — N182 Chronic kidney disease, stage 2 (mild): Secondary | ICD-10-CM | POA: Diagnosis not present

## 2023-10-10 DIAGNOSIS — I10 Essential (primary) hypertension: Secondary | ICD-10-CM | POA: Diagnosis not present

## 2023-10-10 DIAGNOSIS — I719 Aortic aneurysm of unspecified site, without rupture: Secondary | ICD-10-CM | POA: Diagnosis not present

## 2023-10-10 LAB — ECHOCARDIOGRAM COMPLETE
AV Vena cont: 0.34 cm
Calc EF: 62.1 %
Est EF: 55
S' Lateral: 2.08 cm
Single Plane A2C EF: 63.3 %
Single Plane A4C EF: 59.7 %

## 2023-10-11 ENCOUNTER — Ambulatory Visit (HOSPITAL_COMMUNITY)
Admission: RE | Admit: 2023-10-11 | Discharge: 2023-10-11 | Disposition: A | Discharge status: Home / Self Care | Payer: Medicare Other | Source: Ambulatory Visit | Attending: Internal Medicine | Admitting: Internal Medicine

## 2023-10-11 ENCOUNTER — Other Ambulatory Visit: Payer: Self-pay | Admitting: *Deleted

## 2023-10-11 DIAGNOSIS — I351 Nonrheumatic aortic (valve) insufficiency: Secondary | ICD-10-CM

## 2023-10-11 DIAGNOSIS — M79605 Pain in left leg: Secondary | ICD-10-CM

## 2023-10-11 DIAGNOSIS — M79604 Pain in right leg: Secondary | ICD-10-CM | POA: Diagnosis not present

## 2023-10-11 NOTE — Progress Notes (Signed)
Repeat echo in one year per echo recommendations from Dr. Clifton James.

## 2023-10-12 LAB — VAS US ABI WITH/WO TBI
Left ABI: 1.02
Right ABI: 1.05

## 2023-10-18 ENCOUNTER — Other Ambulatory Visit: Payer: Self-pay

## 2023-10-18 MED ORDER — IRBESARTAN 150 MG PO TABS: ORAL_TABLET | ORAL | 3 refills | Status: DC

## 2023-10-27 ENCOUNTER — Other Ambulatory Visit (HOSPITAL_COMMUNITY): Payer: Medicare Other

## 2023-10-28 ENCOUNTER — Telehealth: Payer: Self-pay | Admitting: Cardiovascular Disease

## 2023-10-28 DIAGNOSIS — M48062 Spinal stenosis, lumbar region with neurogenic claudication: Secondary | ICD-10-CM | POA: Diagnosis not present

## 2023-10-28 NOTE — Telephone Encounter (Signed)
Medication list sent via EPIC fax.

## 2023-10-28 NOTE — Telephone Encounter (Signed)
  Maggie - [facility] calling, Dr. Regino Schultze is requesting for medication list. Fax number [phone]  ATTN: Seward Grater

## 2023-10-31 ENCOUNTER — Telehealth: Payer: Self-pay | Admitting: *Deleted

## 2023-10-31 NOTE — Telephone Encounter (Signed)
   Pre-operative Risk Assessment    Patient Name: Bob Elliott  DOB: Apr 22, 1941 MRN: [id number]  DATE OF LAST VISIT: 09/26/23 DR. McALHNAY DATE OF NEXT VISIT: NONE    Request for Surgical Clearance    Procedure:   EPIDURAL INJECTIO  Date of Surgery:  Clearance TBD SOMETIME JAN-FEB 2025                                Surgeon:  DR. Claria Dice Surgeon's Group or Practice Name:  [facility] Phone number:  [phone]; [facility] Fax number:  [phone]   Type of Clearance Requested:   - Medical  - Pharmacy:  Hold Clopidogrel (Plavix) x 5 DAYS PRIOR   Type of Anesthesia:  Not Indicated   Additional requests/questions:    Elpidio Anis   10/31/2023, 11:46 AM

## 2023-11-01 NOTE — Telephone Encounter (Signed)
   Patient Name: Bob Elliott  DOB: 1941/11/14 MRN: [id number]  Primary Cardiologist: Verne Carrow, MD  Chart reviewed as part of pre-operative protocol coverage.  Per Dr. Clifton James, primary cardiologist, Bob Elliott is at acceptable risk for the planned procedure without further cardiovascular testing.   Regarding Plavix therapy, Per Dr.McAlhany, "I think his Plavix is on board with history of CVA. OK to hold for his procedure. Thayer Ohm" from a cardiac standpoint, patient may hold Plavix for 5 days prior to procedure.  Please resume Plavix as soon as possible postprocedure at the discretion of the surgeon.  I will route this recommendation to the requesting party via Epic fax function and remove from pre-op pool.  Please call with questions.  Joylene Grapes, NP 11/01/2023, 11:30 AM

## 2023-11-23 ENCOUNTER — Other Ambulatory Visit: Payer: Self-pay

## 2023-11-23 ENCOUNTER — Other Ambulatory Visit: Payer: Self-pay | Admitting: Surgery

## 2023-11-23 DIAGNOSIS — I7123 Aneurysm of the descending thoracic aorta, without rupture: Secondary | ICD-10-CM

## 2023-11-23 MED ORDER — SPIRONOLACTONE 25 MG PO TABS: 12.5000 mg | ORAL_TABLET | Freq: Every day | ORAL | 3 refills | Status: AC

## 2023-11-29 ENCOUNTER — Emergency Department (HOSPITAL_BASED_OUTPATIENT_CLINIC_OR_DEPARTMENT_OTHER)
Admission: EM | Admit: 2023-11-29 | Discharge: 2023-11-29 | Disposition: A | Discharge status: Home / Self Care | Payer: Medicare Other | Attending: Emergency Medicine | Admitting: Emergency Medicine

## 2023-11-29 ENCOUNTER — Emergency Department (HOSPITAL_BASED_OUTPATIENT_CLINIC_OR_DEPARTMENT_OTHER): Payer: Medicare Other | Admitting: Radiology

## 2023-11-29 ENCOUNTER — Other Ambulatory Visit: Payer: Self-pay

## 2023-11-29 ENCOUNTER — Encounter (HOSPITAL_BASED_OUTPATIENT_CLINIC_OR_DEPARTMENT_OTHER): Payer: Self-pay | Admitting: Emergency Medicine

## 2023-11-29 DIAGNOSIS — Z79899 Other long term (current) drug therapy: Secondary | ICD-10-CM | POA: Diagnosis not present

## 2023-11-29 DIAGNOSIS — G8929 Other chronic pain: Secondary | ICD-10-CM | POA: Diagnosis not present

## 2023-11-29 DIAGNOSIS — M545 Low back pain, unspecified: Secondary | ICD-10-CM | POA: Diagnosis present

## 2023-11-29 DIAGNOSIS — M5441 Lumbago with sciatica, right side: Secondary | ICD-10-CM | POA: Diagnosis not present

## 2023-11-29 DIAGNOSIS — M25551 Pain in right hip: Secondary | ICD-10-CM | POA: Diagnosis not present

## 2023-11-29 DIAGNOSIS — M25572 Pain in left ankle and joints of left foot: Secondary | ICD-10-CM | POA: Diagnosis not present

## 2023-11-29 DIAGNOSIS — Z7902 Long term (current) use of antithrombotics/antiplatelets: Secondary | ICD-10-CM | POA: Diagnosis not present

## 2023-11-29 DIAGNOSIS — M47816 Spondylosis without myelopathy or radiculopathy, lumbar region: Secondary | ICD-10-CM | POA: Diagnosis not present

## 2023-11-29 DIAGNOSIS — M549 Dorsalgia, unspecified: Secondary | ICD-10-CM | POA: Diagnosis not present

## 2023-11-29 DIAGNOSIS — M8580 Other specified disorders of bone density and structure, unspecified site: Secondary | ICD-10-CM | POA: Diagnosis not present

## 2023-11-29 MED ORDER — LIDOCAINE 5 % EX PTCH: 1.0000 | MEDICATED_PATCH | CUTANEOUS | 0 refills | Status: DC

## 2023-11-29 MED ORDER — LIDOCAINE 5 % EX PTCH
1.0000 | MEDICATED_PATCH | CUTANEOUS | Status: DC
  Administered 2023-11-29: 1 via TRANSDERMAL
  Filled 2023-11-29 (×2): qty 1

## 2023-11-29 NOTE — ED Triage Notes (Signed)
 Lower back rt hip pain x 8 months worse in last few days. From home with EMS Denies fall or injury Worse with movement

## 2023-11-29 NOTE — Discharge Instructions (Signed)
 Please refer to the attached instructions. Apply lidocaine patch to affected area once per day. Remove after 12 hours.

## 2023-11-29 NOTE — ED Triage Notes (Signed)
 Pt bib [facility] with c/o RLE and RT lower back pain x 8 months. Difficulty ambulating and standing x 3 days. Denies injury or fall 160/90, HR 84 resp 14 99%

## 2023-11-29 NOTE — ED Provider Notes (Signed)
 [place] EMERGENCY DEPARTMENT AT [facility] Provider Note   CSN: [id number] Arrival date & time: 11/29/23  1331     History  Chief Complaint  Patient presents with   Back Pain    Bob Elliott is a 83 y.o. male.  83 year old male with chronic low and and right hip pain presents today with increased discomfort onset 3 days ago. He is taking tylenol  for pain without significant relief. No recent falls or injury. History of lumbar disk disease and thoracic aortic aneurysm. Patient reports difficulty walking due to pain. No urinary or bowel incontinence.   The history is provided by the patient.  Back Pain Location:  Lumbar spine Radiates to: right hip. Pain severity:  Moderate Pain is:  Same all the time Duration:  3 days Timing:  Constant Progression:  Unchanged Chronicity:  Chronic Context: not recent injury   Ineffective treatments:  OTC medications Associated symptoms: no abdominal pain, no bladder incontinence, no bowel incontinence, no fever and no perianal numbness        Home Medications Prior to Admission medications   Medication Sig Start Date End Date Taking? Authorizing Provider  amLODipine  (NORVASC ) 5 MG tablet Take 1 tablet (5 mg total) by mouth daily. 08/31/23   Verlin Lonni BIRCH, MD  clopidogrel  (PLAVIX ) 75 MG tablet Take 1 tablet (75 mg total) by mouth daily. 06/16/23   Hobart Powell BRAVO, MD  clotrimazole-betamethasone  (LOTRISONE) cream Apply 1 Application topically 2 (two) times daily. 08/05/22   [provider]  doxazosin (CARDURA) 4 MG tablet Take 4 mg by mouth at bedtime.  02/12/14   [provider]  doxycycline  (VIBRA -TABS) 100 MG tablet Take 1 tablet (100 mg total) by mouth 2 (two) times daily. 04/29/23   Gershon Donnice JONELLE, DPM  esomeprazole  (NEXIUM ) 40 MG capsule Take 40 mg by mouth 2 (two) times daily. 08/15/22   [provider]  Evolocumab  (REPATHA  SURECLICK) 140 MG/ML SOAJ Inject 140 mg into the skin every  14 (fourteen) days. 03/22/23   Hobart Powell BRAVO, MD  fluticasone (FLONASE) 50 MCG/ACT nasal spray Place 2 sprays into both nostrils daily as needed for allergies or rhinitis.  03/28/14   [provider]  guaiFENesin -dextromethorphan (ROBITUSSIN DM) 100-10 MG/5ML syrup Take 5 mLs by mouth 3 (three) times daily as needed for cough. 06/24/21   Patsey Lot, MD  irbesartan  (AVAPRO ) 150 MG tablet TAKE 1 TABLET DAILY (DOSE DECREASE) 10/18/23   Verlin Lonni BIRCH, MD  loratadine (CLARITIN) 10 MG tablet Take 10 mg by mouth daily.    [provider]  potassium chloride  (KLOR-CON  M) 10 MEQ tablet Take 1 tablet (10 mEq total) by mouth daily. 04/15/23   Hobart Powell BRAVO, MD  solifenacin (VESICARE) 5 MG tablet Take 5 mg by mouth as needed. 05/23/20   [provider]  spironolactone  (ALDACTONE ) 25 MG tablet Take 0.5 tablets (12.5 mg total) by mouth daily. 11/23/23   Verlin Lonni BIRCH, MD  timolol (TIMOPTIC) 0.5 % ophthalmic solution Place 1 drop into both eyes daily.  11/25/17   [provider]      Allergies    Aspirin, Codeine, Pravastatin  sodium, Shellfish allergy, Amitriptyline, Atorvastatin , Gabapentin, Other, Repatha  [evolocumab ], Rosuvastatin , Tramadol hcl, and Zetia  [ezetimibe ]    Review of Systems   Review of Systems  Constitutional:  Negative for fever.  Gastrointestinal:  Negative for abdominal pain and bowel incontinence.  Genitourinary:  Negative for bladder incontinence.  Musculoskeletal:  Positive for back pain.  All other  systems reviewed and are negative.   Physical Exam Updated Vital Signs BP (!) 157/84 (BP Location: Right Arm)   Pulse 80   Temp 97.9 F (36.6 C)   Resp 16   SpO2 96%  Physical Exam Constitutional:      Appearance: Normal appearance.  HENT:     Head: Normocephalic.  Cardiovascular:     Rate and Rhythm: Normal rate and regular rhythm.  Pulmonary:     Effort: Pulmonary effort is normal.     Breath sounds: Normal  breath sounds.  Abdominal:     Palpations: Abdomen is soft.  Musculoskeletal:        General: Normal range of motion.     Right hip: Bony tenderness present. No deformity. Normal range of motion.  Skin:    General: Skin is warm and dry.  Neurological:     Mental Status: He is alert and oriented to person, place, and time.  Psychiatric:        Mood and Affect: Mood normal.        Behavior: Behavior normal.     ED Results / Procedures / Treatments   Labs (all labs ordered are listed, but only abnormal results are displayed) Labs Reviewed - No data to display  EKG None  Radiology DG Hip Unilat With Pelvis 2-3 Views Right Result Date: 11/29/2023 CLINICAL DATA:  Pain. EXAM: DG HIP (WITH OR WITHOUT PELVIS) 3V RIGHT COMPARISON:  None Available. FINDINGS: Osteopenia. Grossly preserved joint spaces. Hyperostosis. No fracture or dislocation. Presumed vascular calcifications along the pelvis. Degenerative changes of the lumbar spine. If there is persistent pain or further concern additional workup as clinically appropriate IMPRESSION: Chronic changes.  No acute osseous abnormality. Electronically Signed   By: Ranell Bring M.D.   On: 11/29/2023 17:32    Procedures Procedures    Medications Ordered in ED Medications - No data to display  ED Course/ Medical Decision Making/ A&P                                 Medical Decision Making Amount and/or Complexity of Data Reviewed Radiology: ordered.  Risk Prescription drug management.   Patient with back pain.  No neurological deficits and normal neuro exam.  Patient is ambulatory.  No loss of bowel or bladder control.  No concern for cauda equina.  No fever, night sweats, weight loss, h/o cancer, IVDA, no recent procedure to back. No urinary symptoms suggestive of UTI.  Supportive care and return precaution discussed. Appears safe for discharge at this time. Follow up as indicated in discharge paperwork.          Final Clinical  Impression(s) / ED Diagnoses Final diagnoses:  Chronic right-sided low back pain with right-sided sciatica    Rx / DC Orders ED Discharge Orders          Ordered    lidocaine  (LIDODERM ) 5 %  Every 24 hours        11/29/23 1751              Claudene Lenis, NP 11/30/23 0014    Yolande Lamar BROCKS, MD 12/01/23 1244

## 2023-11-30 DIAGNOSIS — M545 Low back pain, unspecified: Secondary | ICD-10-CM | POA: Diagnosis not present

## 2023-11-30 DIAGNOSIS — M25551 Pain in right hip: Secondary | ICD-10-CM | POA: Diagnosis not present

## 2023-12-06 DIAGNOSIS — H2512 Age-related nuclear cataract, left eye: Secondary | ICD-10-CM | POA: Diagnosis not present

## 2023-12-06 DIAGNOSIS — H52203 Unspecified astigmatism, bilateral: Secondary | ICD-10-CM | POA: Diagnosis not present

## 2023-12-07 DIAGNOSIS — M25551 Pain in right hip: Secondary | ICD-10-CM | POA: Diagnosis not present

## 2023-12-08 DIAGNOSIS — M48062 Spinal stenosis, lumbar region with neurogenic claudication: Secondary | ICD-10-CM | POA: Diagnosis not present

## 2023-12-19 DIAGNOSIS — M519 Unspecified thoracic, thoracolumbar and lumbosacral intervertebral disc disorder: Secondary | ICD-10-CM | POA: Diagnosis not present

## 2023-12-19 DIAGNOSIS — I10 Essential (primary) hypertension: Secondary | ICD-10-CM | POA: Diagnosis not present

## 2023-12-19 DIAGNOSIS — N1831 Chronic kidney disease, stage 3a: Secondary | ICD-10-CM | POA: Diagnosis not present

## 2023-12-19 DIAGNOSIS — R350 Frequency of micturition: Secondary | ICD-10-CM | POA: Diagnosis not present

## 2023-12-20 DIAGNOSIS — M47896 Other spondylosis, lumbar region: Secondary | ICD-10-CM | POA: Diagnosis not present

## 2023-12-29 ENCOUNTER — Encounter: Payer: Self-pay | Admitting: Surgery

## 2023-12-29 DIAGNOSIS — M47896 Other spondylosis, lumbar region: Secondary | ICD-10-CM | POA: Diagnosis not present

## 2023-12-30 ENCOUNTER — Ambulatory Visit
Admission: RE | Admit: 2023-12-30 | Discharge: 2023-12-30 | Disposition: A | Discharge status: Home / Self Care | Payer: Medicare Other | Source: Ambulatory Visit | Attending: Surgery | Admitting: Surgery

## 2023-12-30 DIAGNOSIS — I7121 Aneurysm of the ascending aorta, without rupture: Secondary | ICD-10-CM | POA: Diagnosis not present

## 2023-12-30 DIAGNOSIS — I7123 Aneurysm of the descending thoracic aorta, without rupture: Secondary | ICD-10-CM

## 2024-01-02 DIAGNOSIS — M48062 Spinal stenosis, lumbar region with neurogenic claudication: Secondary | ICD-10-CM | POA: Diagnosis not present

## 2024-01-04 ENCOUNTER — Ambulatory Visit: Payer: Medicare Other | Admitting: Surgery

## 2024-01-06 DIAGNOSIS — M519 Unspecified thoracic, thoracolumbar and lumbosacral intervertebral disc disorder: Secondary | ICD-10-CM | POA: Diagnosis not present

## 2024-01-06 DIAGNOSIS — I10 Essential (primary) hypertension: Secondary | ICD-10-CM | POA: Diagnosis not present

## 2024-01-06 DIAGNOSIS — N1831 Chronic kidney disease, stage 3a: Secondary | ICD-10-CM | POA: Diagnosis not present

## 2024-01-12 DIAGNOSIS — Z961 Presence of intraocular lens: Secondary | ICD-10-CM | POA: Diagnosis not present

## 2024-01-12 DIAGNOSIS — H25812 Combined forms of age-related cataract, left eye: Secondary | ICD-10-CM | POA: Diagnosis not present

## 2024-01-12 DIAGNOSIS — H2512 Age-related nuclear cataract, left eye: Secondary | ICD-10-CM | POA: Diagnosis not present

## 2024-01-24 ENCOUNTER — Other Ambulatory Visit: Payer: Self-pay

## 2024-01-24 DIAGNOSIS — Z79899 Other long term (current) drug therapy: Secondary | ICD-10-CM

## 2024-01-24 DIAGNOSIS — E785 Hyperlipidemia, unspecified: Secondary | ICD-10-CM

## 2024-01-24 DIAGNOSIS — I10 Essential (primary) hypertension: Secondary | ICD-10-CM

## 2024-01-24 MED ORDER — AMLODIPINE BESYLATE 5 MG PO TABS: 5.0000 mg | ORAL_TABLET | Freq: Every day | ORAL | 1 refills | Status: DC

## 2024-02-08 ENCOUNTER — Ambulatory Visit (INDEPENDENT_AMBULATORY_CARE_PROVIDER_SITE_OTHER): Payer: Medicare Other | Admitting: Surgery

## 2024-02-08 ENCOUNTER — Encounter: Payer: Self-pay | Admitting: Surgery

## 2024-02-08 VITALS — BP 158/73 | HR 71 | Resp 18 | Ht 68.0 in | Wt 204.0 lb

## 2024-02-08 DIAGNOSIS — I712 Thoracic aortic aneurysm, without rupture, unspecified: Secondary | ICD-10-CM | POA: Diagnosis not present

## 2024-02-08 NOTE — Progress Notes (Unsigned)
    HPI: ***  Current Outpatient Medications  Medication Sig Dispense Refill   amLODipine (NORVASC) 5 MG tablet Take 1 tablet (5 mg total) by mouth daily. 90 tablet 1   clopidogrel (PLAVIX) 75 MG tablet Take 1 tablet (75 mg total) by mouth daily. 90 tablet 2   clotrimazole-betamethasone (LOTRISONE) cream Apply 1 Application topically 2 (two) times daily.     doxazosin (CARDURA) 4 MG tablet Take 4 mg by mouth at bedtime.      doxycycline (VIBRA-TABS) 100 MG tablet Take 1 tablet (100 mg total) by mouth 2 (two) times daily. 14 tablet 0   esomeprazole (NEXIUM) 40 MG capsule Take 40 mg by mouth 2 (two) times daily.     Evolocumab (REPATHA SURECLICK) 140 MG/ML SOAJ Inject 140 mg into the skin every 14 (fourteen) days. 2 mL 11   fluticasone (FLONASE) 50 MCG/ACT nasal spray Place 2 sprays into both nostrils daily as needed for allergies or rhinitis.      guaiFENesin-dextromethorphan (ROBITUSSIN DM) 100-10 MG/5ML syrup Take 5 mLs by mouth 3 (three) times daily as needed for cough. 118 mL 0   irbesartan (AVAPRO) 150 MG tablet TAKE 1 TABLET DAILY (DOSE DECREASE) 90 tablet 3   lidocaine (LIDODERM) 5 % Place 1 patch onto the skin daily. Remove & Discard patch within 12 hours or as directed by MD 30 patch 0   loratadine (CLARITIN) 10 MG tablet Take 10 mg by mouth daily.     potassium chloride (KLOR-CON M) 10 MEQ tablet Take 1 tablet (10 mEq total) by mouth daily. 90 tablet 1   solifenacin (VESICARE) 5 MG tablet Take 5 mg by mouth as needed.     spironolactone (ALDACTONE) 25 MG tablet Take 0.5 tablets (12.5 mg total) by mouth daily. 45 tablet 3   timolol (TIMOPTIC) 0.5 % ophthalmic solution Place 1 drop into both eyes daily.   4   No current facility-administered medications for this visit.     Physical Exam: ***  Diagnostic Tests: ***  Impression: ***  Plan: ***   Alleen Borne, MD [facility] [phone]

## 2024-02-10 ENCOUNTER — Telehealth: Payer: Self-pay | Admitting: Physician Assistant

## 2024-02-10 NOTE — Telephone Encounter (Signed)
 I am covering Bob Elliott inbasket who received CC'd chart from Dr. Laneta Simmers from their visit 3/26 for routine f/u of thoracic aneurysm. Dr. Laneta Simmers advised importance of good blood pressure control. BP was 158/73 at that visit. Elon Jester has not seen patient since 2021, last OV 08/2023 with Dr. Clifton James with plan for 1 year follow-up.  Given elevated BP at CT surgery visit, recommend patient relay home BP readings to our office after 3 days of readings, along with HR if able, to help guide whether meds need to be adjusted. He may also follow up with his PCP if he prefers for his blood pressure. Thank you.

## 2024-02-13 DIAGNOSIS — N400 Enlarged prostate without lower urinary tract symptoms: Secondary | ICD-10-CM | POA: Diagnosis not present

## 2024-02-13 DIAGNOSIS — N182 Chronic kidney disease, stage 2 (mild): Secondary | ICD-10-CM | POA: Diagnosis not present

## 2024-02-13 DIAGNOSIS — M25512 Pain in left shoulder: Secondary | ICD-10-CM | POA: Diagnosis not present

## 2024-02-13 DIAGNOSIS — E782 Mixed hyperlipidemia: Secondary | ICD-10-CM | POA: Diagnosis not present

## 2024-02-13 DIAGNOSIS — M1990 Unspecified osteoarthritis, unspecified site: Secondary | ICD-10-CM | POA: Diagnosis not present

## 2024-02-13 NOTE — Telephone Encounter (Signed)
 Spoke with patient about obtaining BP pressure readings for 3 days and patient states he will call our office back on Friday.

## 2024-02-17 ENCOUNTER — Telehealth: Payer: Self-pay | Admitting: Cardiovascular Disease

## 2024-02-17 ENCOUNTER — Encounter: Payer: Self-pay | Admitting: *Deleted

## 2024-02-17 NOTE — Telephone Encounter (Signed)
 Pt states that he was told to record BP for 2 days. Pt recorded for 3 days and the results are as follows:  Tues (4/1) - 135/69, HR 66 Wed (4/2) - 129/61, HR 68 Thurs (4/3) - 129/68, HR 86

## 2024-02-17 NOTE — Telephone Encounter (Signed)
 Call placed to pt in regards to his blood pressure readings below.  Left a message for pt to call back.

## 2024-02-17 NOTE — Telephone Encounter (Signed)
 Since the results were a little borderline of upper limits of normal, check for 1 more week at least 3 hours after AM meds and submit readings. Thank you!

## 2024-02-20 ENCOUNTER — Other Ambulatory Visit: Payer: Self-pay | Admitting: Pharmacist

## 2024-02-20 MED ORDER — REPATHA SURECLICK 140 MG/ML ~~LOC~~ SOAJ: 1.0000 | SUBCUTANEOUS | 11 refills | Status: AC

## 2024-03-14 DIAGNOSIS — N182 Chronic kidney disease, stage 2 (mild): Secondary | ICD-10-CM | POA: Diagnosis not present

## 2024-03-14 DIAGNOSIS — M1990 Unspecified osteoarthritis, unspecified site: Secondary | ICD-10-CM | POA: Diagnosis not present

## 2024-03-14 DIAGNOSIS — E782 Mixed hyperlipidemia: Secondary | ICD-10-CM | POA: Diagnosis not present

## 2024-03-14 DIAGNOSIS — N400 Enlarged prostate without lower urinary tract symptoms: Secondary | ICD-10-CM | POA: Diagnosis not present

## 2024-04-12 DIAGNOSIS — M519 Unspecified thoracic, thoracolumbar and lumbosacral intervertebral disc disorder: Secondary | ICD-10-CM | POA: Diagnosis not present

## 2024-04-12 DIAGNOSIS — I719 Aortic aneurysm of unspecified site, without rupture: Secondary | ICD-10-CM | POA: Diagnosis not present

## 2024-04-12 DIAGNOSIS — N400 Enlarged prostate without lower urinary tract symptoms: Secondary | ICD-10-CM | POA: Diagnosis not present

## 2024-04-12 DIAGNOSIS — M5090 Cervical disc disorder, unspecified, unspecified cervical region: Secondary | ICD-10-CM | POA: Diagnosis not present

## 2024-04-12 DIAGNOSIS — N1831 Chronic kidney disease, stage 3a: Secondary | ICD-10-CM | POA: Diagnosis not present

## 2024-04-12 DIAGNOSIS — R7303 Prediabetes: Secondary | ICD-10-CM | POA: Diagnosis not present

## 2024-04-12 DIAGNOSIS — K224 Dyskinesia of esophagus: Secondary | ICD-10-CM | POA: Diagnosis not present

## 2024-04-12 DIAGNOSIS — G720 Drug-induced myopathy: Secondary | ICD-10-CM | POA: Diagnosis not present

## 2024-04-12 DIAGNOSIS — E782 Mixed hyperlipidemia: Secondary | ICD-10-CM | POA: Diagnosis not present

## 2024-04-12 DIAGNOSIS — Z1331 Encounter for screening for depression: Secondary | ICD-10-CM | POA: Diagnosis not present

## 2024-04-12 DIAGNOSIS — M109 Gout, unspecified: Secondary | ICD-10-CM | POA: Diagnosis not present

## 2024-04-12 DIAGNOSIS — I10 Essential (primary) hypertension: Secondary | ICD-10-CM | POA: Diagnosis not present

## 2024-04-12 DIAGNOSIS — Z0000 Encounter for general adult medical examination without abnormal findings: Secondary | ICD-10-CM | POA: Diagnosis not present

## 2024-04-12 DIAGNOSIS — I700 Atherosclerosis of aorta: Secondary | ICD-10-CM | POA: Diagnosis not present

## 2024-04-14 DIAGNOSIS — N400 Enlarged prostate without lower urinary tract symptoms: Secondary | ICD-10-CM | POA: Diagnosis not present

## 2024-04-14 DIAGNOSIS — E782 Mixed hyperlipidemia: Secondary | ICD-10-CM | POA: Diagnosis not present

## 2024-04-14 DIAGNOSIS — N182 Chronic kidney disease, stage 2 (mild): Secondary | ICD-10-CM | POA: Diagnosis not present

## 2024-04-14 DIAGNOSIS — M1990 Unspecified osteoarthritis, unspecified site: Secondary | ICD-10-CM | POA: Diagnosis not present

## 2024-04-18 DIAGNOSIS — M7062 Trochanteric bursitis, left hip: Secondary | ICD-10-CM | POA: Diagnosis not present

## 2024-04-18 DIAGNOSIS — M67912 Unspecified disorder of synovium and tendon, left shoulder: Secondary | ICD-10-CM | POA: Diagnosis not present

## 2024-04-24 DIAGNOSIS — M48062 Spinal stenosis, lumbar region with neurogenic claudication: Secondary | ICD-10-CM | POA: Diagnosis not present

## 2024-05-14 DIAGNOSIS — N400 Enlarged prostate without lower urinary tract symptoms: Secondary | ICD-10-CM | POA: Diagnosis not present

## 2024-05-14 DIAGNOSIS — E782 Mixed hyperlipidemia: Secondary | ICD-10-CM | POA: Diagnosis not present

## 2024-05-14 DIAGNOSIS — N182 Chronic kidney disease, stage 2 (mild): Secondary | ICD-10-CM | POA: Diagnosis not present

## 2024-05-14 DIAGNOSIS — M1990 Unspecified osteoarthritis, unspecified site: Secondary | ICD-10-CM | POA: Diagnosis not present

## 2024-05-15 DIAGNOSIS — M48062 Spinal stenosis, lumbar region with neurogenic claudication: Secondary | ICD-10-CM | POA: Diagnosis not present

## 2024-06-14 DIAGNOSIS — N182 Chronic kidney disease, stage 2 (mild): Secondary | ICD-10-CM | POA: Diagnosis not present

## 2024-06-14 DIAGNOSIS — E782 Mixed hyperlipidemia: Secondary | ICD-10-CM | POA: Diagnosis not present

## 2024-06-14 DIAGNOSIS — N400 Enlarged prostate without lower urinary tract symptoms: Secondary | ICD-10-CM | POA: Diagnosis not present

## 2024-06-14 DIAGNOSIS — M1990 Unspecified osteoarthritis, unspecified site: Secondary | ICD-10-CM | POA: Diagnosis not present

## 2024-06-29 ENCOUNTER — Ambulatory Visit
Admission: RE | Admit: 2024-06-29 | Discharge: 2024-06-29 | Disposition: A | Discharge status: Home / Self Care | Source: Ambulatory Visit | Attending: Internal Medicine | Admitting: Internal Medicine

## 2024-06-29 ENCOUNTER — Other Ambulatory Visit: Payer: Self-pay | Admitting: Internal Medicine

## 2024-06-29 DIAGNOSIS — M19029 Primary osteoarthritis, unspecified elbow: Secondary | ICD-10-CM | POA: Diagnosis not present

## 2024-06-29 DIAGNOSIS — M19021 Primary osteoarthritis, right elbow: Secondary | ICD-10-CM | POA: Diagnosis not present

## 2024-06-29 DIAGNOSIS — M79631 Pain in right forearm: Secondary | ICD-10-CM | POA: Diagnosis not present

## 2024-06-29 DIAGNOSIS — M109 Gout, unspecified: Secondary | ICD-10-CM | POA: Diagnosis not present

## 2024-07-15 DIAGNOSIS — E782 Mixed hyperlipidemia: Secondary | ICD-10-CM | POA: Diagnosis not present

## 2024-07-15 DIAGNOSIS — N182 Chronic kidney disease, stage 2 (mild): Secondary | ICD-10-CM | POA: Diagnosis not present

## 2024-07-15 DIAGNOSIS — M1990 Unspecified osteoarthritis, unspecified site: Secondary | ICD-10-CM | POA: Diagnosis not present

## 2024-07-15 DIAGNOSIS — N400 Enlarged prostate without lower urinary tract symptoms: Secondary | ICD-10-CM | POA: Diagnosis not present

## 2024-07-24 ENCOUNTER — Other Ambulatory Visit: Payer: Self-pay

## 2024-07-24 DIAGNOSIS — I10 Essential (primary) hypertension: Secondary | ICD-10-CM

## 2024-07-24 DIAGNOSIS — E785 Hyperlipidemia, unspecified: Secondary | ICD-10-CM

## 2024-07-24 DIAGNOSIS — Z79899 Other long term (current) drug therapy: Secondary | ICD-10-CM

## 2024-07-25 MED ORDER — AMLODIPINE BESYLATE 5 MG PO TABS: 5.0000 mg | ORAL_TABLET | Freq: Every day | ORAL | 0 refills | Status: DC

## 2024-07-27 ENCOUNTER — Other Ambulatory Visit: Payer: Self-pay | Admitting: Cardiovascular Disease

## 2024-07-27 MED ORDER — IRBESARTAN 150 MG PO TABS: ORAL_TABLET | ORAL | 0 refills | Status: AC

## 2024-07-27 NOTE — Telephone Encounter (Signed)
 Pt is requesting a refill on non cardiac medication esomeprazole . Would Dr. Verlin like to refill this non cardiac medication? Please address

## 2024-07-27 NOTE — Telephone Encounter (Signed)
*  STAT* If patient is at the pharmacy, call can be transferred to refill team.   1. Which medications need to be refilled? (please list name of each medication and dose if known) esomeprazole  (NEXIUM ) 40 MG capsule   irbesartan  (AVAPRO ) 150 MG tablet   2. Which pharmacy/location (including street and city if local pharmacy) is medication to be sent to? [facility] - [place], [place] - [address]   3. Do they need a 30 day or 90 day supply? 90

## 2024-08-03 DIAGNOSIS — G8929 Other chronic pain: Secondary | ICD-10-CM | POA: Diagnosis not present

## 2024-08-03 DIAGNOSIS — M5441 Lumbago with sciatica, right side: Secondary | ICD-10-CM | POA: Diagnosis not present

## 2024-08-03 DIAGNOSIS — M5442 Lumbago with sciatica, left side: Secondary | ICD-10-CM | POA: Diagnosis not present

## 2024-08-06 NOTE — Telephone Encounter (Signed)
 Reviewed with Dr Verlin and he would like PCP to refill esomeprazole 

## 2024-08-08 ENCOUNTER — Emergency Department (HOSPITAL_COMMUNITY): Admission: EM | Admit: 2024-08-08 | Discharge: 2024-08-08 | Disposition: A | Discharge status: Home / Self Care

## 2024-08-08 ENCOUNTER — Emergency Department (HOSPITAL_COMMUNITY): Admitting: Anesthesiology

## 2024-08-08 ENCOUNTER — Encounter (HOSPITAL_COMMUNITY): Payer: Self-pay

## 2024-08-08 ENCOUNTER — Emergency Department (HOSPITAL_COMMUNITY)

## 2024-08-08 ENCOUNTER — Encounter (HOSPITAL_COMMUNITY)
Admission: EM | Disposition: A | Discharge status: Home / Self Care | Payer: Self-pay | Source: Home / Self Care | Attending: Emergency Medicine

## 2024-08-08 ENCOUNTER — Other Ambulatory Visit: Payer: Self-pay

## 2024-08-08 DIAGNOSIS — N189 Chronic kidney disease, unspecified: Secondary | ICD-10-CM | POA: Insufficient documentation

## 2024-08-08 DIAGNOSIS — K2100 Gastro-esophageal reflux disease with esophagitis, without bleeding: Secondary | ICD-10-CM | POA: Diagnosis not present

## 2024-08-08 DIAGNOSIS — K117 Disturbances of salivary secretion: Secondary | ICD-10-CM | POA: Diagnosis not present

## 2024-08-08 DIAGNOSIS — K209 Esophagitis, unspecified without bleeding: Secondary | ICD-10-CM

## 2024-08-08 DIAGNOSIS — T18128A Food in esophagus causing other injury, initial encounter: Secondary | ICD-10-CM | POA: Diagnosis not present

## 2024-08-08 DIAGNOSIS — Z7902 Long term (current) use of antithrombotics/antiplatelets: Secondary | ICD-10-CM | POA: Insufficient documentation

## 2024-08-08 DIAGNOSIS — Z79899 Other long term (current) drug therapy: Secondary | ICD-10-CM | POA: Diagnosis not present

## 2024-08-08 DIAGNOSIS — I10 Essential (primary) hypertension: Secondary | ICD-10-CM | POA: Diagnosis not present

## 2024-08-08 DIAGNOSIS — I639 Cerebral infarction, unspecified: Secondary | ICD-10-CM | POA: Diagnosis not present

## 2024-08-08 DIAGNOSIS — Q396 Congenital diverticulum of esophagus: Secondary | ICD-10-CM | POA: Insufficient documentation

## 2024-08-08 DIAGNOSIS — R059 Cough, unspecified: Secondary | ICD-10-CM | POA: Diagnosis present

## 2024-08-08 DIAGNOSIS — I129 Hypertensive chronic kidney disease with stage 1 through stage 4 chronic kidney disease, or unspecified chronic kidney disease: Secondary | ICD-10-CM | POA: Insufficient documentation

## 2024-08-08 DIAGNOSIS — G4730 Sleep apnea, unspecified: Secondary | ICD-10-CM

## 2024-08-08 DIAGNOSIS — Z0389 Encounter for observation for other suspected diseases and conditions ruled out: Secondary | ICD-10-CM | POA: Diagnosis not present

## 2024-08-08 DIAGNOSIS — T18108A Unspecified foreign body in esophagus causing other injury, initial encounter: Secondary | ICD-10-CM | POA: Diagnosis not present

## 2024-08-08 DIAGNOSIS — K2970 Gastritis, unspecified, without bleeding: Secondary | ICD-10-CM | POA: Diagnosis not present

## 2024-08-08 DIAGNOSIS — X58XXXA Exposure to other specified factors, initial encounter: Secondary | ICD-10-CM | POA: Insufficient documentation

## 2024-08-08 DIAGNOSIS — R1310 Dysphagia, unspecified: Secondary | ICD-10-CM | POA: Diagnosis not present

## 2024-08-08 DIAGNOSIS — N182 Chronic kidney disease, stage 2 (mild): Secondary | ICD-10-CM | POA: Diagnosis not present

## 2024-08-08 HISTORY — PX: ESOPHAGOGASTRODUODENOSCOPY: SHX5428

## 2024-08-08 LAB — CBC WITH DIFFERENTIAL/PLATELET
Abs Immature Granulocytes: 0.05 K/uL (ref 0.00–0.07)
Basophils Absolute: 0 K/uL (ref 0.0–0.1)
Basophils Relative: 0 %
Eosinophils Absolute: 0.1 K/uL (ref 0.0–0.5)
Eosinophils Relative: 1 %
HCT: 44.9 % (ref 39.0–52.0)
Hemoglobin: 15.3 g/dL (ref 13.0–17.0)
Immature Granulocytes: 1 %
Lymphocytes Relative: 12 %
Lymphs Abs: 1.1 K/uL (ref 0.7–4.0)
MCH: 30.7 pg (ref 26.0–34.0)
MCHC: 34.1 g/dL (ref 30.0–36.0)
MCV: 90.2 fL (ref 80.0–100.0)
Monocytes Absolute: 0.8 K/uL (ref 0.1–1.0)
Monocytes Relative: 8 %
Neutro Abs: 7 K/uL (ref 1.7–7.7)
Neutrophils Relative %: 78 %
Platelets: 174 K/uL (ref 150–400)
RBC: 4.98 MIL/uL (ref 4.22–5.81)
RDW: 13.1 % (ref 11.5–15.5)
WBC: 9 K/uL (ref 4.0–10.5)
nRBC: 0 % (ref 0.0–0.2)

## 2024-08-08 LAB — BASIC METABOLIC PANEL WITH GFR
Anion gap: 10 (ref 5–15)
BUN: 20 mg/dL (ref 8–23)
CO2: 24 mmol/L (ref 22–32)
Calcium: 8.9 mg/dL (ref 8.9–10.3)
Chloride: 103 mmol/L (ref 98–111)
Creatinine, Ser: 1.3 mg/dL — ABNORMAL HIGH (ref 0.61–1.24)
GFR, Estimated: 54 mL/min — ABNORMAL LOW (ref >60)
Glucose, Bld: 106 mg/dL — ABNORMAL HIGH (ref 70–99)
Potassium: 4.6 mmol/L (ref 3.5–5.1)
Sodium: 137 mmol/L (ref 135–145)

## 2024-08-08 SURGERY — EGD (ESOPHAGOGASTRODUODENOSCOPY)
Anesthesia: General

## 2024-08-08 MED ORDER — SODIUM CHLORIDE 0.9 % IV SOLN: INTRAVENOUS | Status: DC

## 2024-08-08 MED ORDER — AMISULPRIDE (ANTIEMETIC) 5 MG/2ML IV SOLN
10.0000 mg | Freq: Once | INTRAVENOUS | Status: AC
  Administered 2024-08-08: 10 mg via INTRAVENOUS

## 2024-08-08 MED ORDER — LIDOCAINE HCL (CARDIAC) PF 100 MG/5ML IV SOSY
PREFILLED_SYRINGE | INTRAVENOUS | Status: DC | PRN
  Administered 2024-08-08: 60 mg via INTRAVENOUS

## 2024-08-08 MED ORDER — PROPOFOL 10 MG/ML IV BOLUS
INTRAVENOUS | Status: DC | PRN
Start: 2024-08-08 — End: 2024-08-08
  Administered 2024-08-08: 120 mg via INTRAVENOUS

## 2024-08-08 MED ORDER — ONDANSETRON HCL 4 MG/2ML IJ SOLN
INTRAMUSCULAR | Status: DC | PRN
  Administered 2024-08-08: 4 mg via INTRAVENOUS

## 2024-08-08 MED ORDER — AMISULPRIDE (ANTIEMETIC) 5 MG/2ML IV SOLN
INTRAVENOUS | Status: AC
  Filled 2024-08-08: qty 4

## 2024-08-08 MED ORDER — SUCCINYLCHOLINE CHLORIDE 200 MG/10ML IV SOSY
PREFILLED_SYRINGE | INTRAVENOUS | Status: DC | PRN
  Administered 2024-08-08: 120 mg via INTRAVENOUS

## 2024-08-08 NOTE — ED Provider Triage Note (Signed)
 Emergency Medicine Provider Triage Evaluation Note  DONNIS PHANEUF , a 83 y.o. male  was evaluated in triage.  Pt complains of FB suspected in esophagus, pooling secretions and intermittent coughing, but no acute distress, normal vital signs, able to speak in full sentences. Has esophageal motility issues at baseline.   Review of Systems  Positive: FB Negative: Fever  Physical Exam  BP 129/68 (BP Location: Left Arm)   Pulse 86   Temp 97.8 F (36.6 C)   Resp 20   SpO2 98%  Gen:   Awake, no distress  Resp:  Normal effort  MSK:   Moves extremities without difficulty  Other:    Medical Decision Making  Medically screening exam initiated at 3:19 PM.  Appropriate orders placed.  Gaither JONELLE Kluver was informed that the remainder of the evaluation will be completed by another provider, this initial triage assessment does not replace that evaluation, and the importance of remaining in the ED until their evaluation is complete.  Going from triage to Room. Charge nurse made aware.   Shermon Warren SAILOR, PA-C 08/08/24 1520

## 2024-08-08 NOTE — ED Triage Notes (Signed)
 Patient states he swallowed hot dog about 45 mins ago that is stuck in his esophagus, he has a esophagus problem. Patient is breathing evenly and unlabored, but is dry heaving intermittently and spitting out saliva.

## 2024-08-08 NOTE — Anesthesia Preprocedure Evaluation (Signed)
 Anesthesia Evaluation  Patient identified by MRN, date of birth, ID band Patient awake    Reviewed: Allergy & Precautions, NPO status , Patient's Chart, lab work & pertinent test results  Airway Mallampati: III  TM Distance: >3 FB Neck ROM: Full    Dental  (+) Dental Advisory Given   Pulmonary sleep apnea    breath sounds clear to auscultation       Cardiovascular hypertension, Pt. on medications + Peripheral Vascular Disease (4.4cm stable ascending aortic aneurysm) and + DOE   Rhythm:Regular Rate:Normal     Neuro/Psych CVA    GI/Hepatic Neg liver ROS,GERD  ,,  Endo/Other  negative endocrine ROS    Renal/GU Renal InsufficiencyRenal disease     Musculoskeletal  (+) Arthritis ,    Abdominal   Peds  Hematology negative hematology ROS (+)   Anesthesia Other Findings   Reproductive/Obstetrics                              Anesthesia Physical Anesthesia Plan  ASA: 3  Anesthesia Plan: General   Post-op Pain Management:    Induction: Intravenous and Rapid sequence  PONV Risk Score and Plan: 2 and Treatment may vary due to age or medical condition  Airway Management Planned: Oral ETT  Additional Equipment:   Intra-op Plan:   Post-operative Plan: Extubation in OR  Informed Consent: I have reviewed the patients History and Physical, chart, labs and discussed the procedure including the risks, benefits and alternatives for the proposed anesthesia with the patient or authorized representative who has indicated his/her understanding and acceptance.       Plan Discussed with: CRNA  Anesthesia Plan Comments:         Anesthesia Quick Evaluation

## 2024-08-08 NOTE — Progress Notes (Signed)
 Patient received print out from Dr. Burnette detailing procedure and findings, as well as, post-procedure follow up.

## 2024-08-08 NOTE — Op Note (Signed)
 [facility] Patient Name: Bob Elliott Procedure Date : 08/08/2024 MRN: [id number] Attending MD: Elsie Cree , MD, [id number] Date of Birth: [birth year] CSN: [id number] Age: 83 Admit Type: Emergency Department Procedure:                Upper GI endoscopy Indications:              Dysphagia, Suspected ingestion of foreign body,                            Foreign body in the esophagus Providers:                Elsie Cree, MD, Jacquelyn Jaci Pierce, RN,                            Fairy Marina, Technician Referring MD:              Medicines:                General Anesthesia Complications:            No immediate complications. Estimated Blood Loss:     Estimated blood loss: none. Procedure:                Pre-Anesthesia Assessment:                           - Prior to the procedure, a History and Physical                            was performed, and patient medications and                            allergies were reviewed. The patient's tolerance of                            previous anesthesia was also reviewed. The risks                            and benefits of the procedure and the sedation                            options and risks were discussed with the patient.                            All questions were answered, and informed consent                            was obtained. Prior Anticoagulants: The patient has                            taken Plavix  (clopidogrel ), last dose was day of                            procedure. ASA Grade Assessment: III - A patient  with severe systemic disease. After reviewing the                            risks and benefits, the patient was deemed in                            satisfactory condition to undergo the procedure.                           After obtaining informed consent, the endoscope was                            passed under direct vision. Throughout the                             procedure, the patient's blood pressure, pulse, and                            oxygen saturations were monitored continuously. The                            GIF-H190 ([id number]) Olympus endoscope was introduced                            through the mouth, and advanced to the second part                            of duodenum. The upper GI endoscopy was                            accomplished without difficulty. The patient                            tolerated the procedure well. Scope In: Scope Out: Findings:      A non-bleeding diverticulum with a small opening and no stigmata of       recent bleeding was found in the distal esophagus.      Esophagitis versus sloughed mucosa, focal about 2 cm in length,       circumferential, a few cm proximal to GE junction. On plavix , no       biopsies obtained. Appearance not consistent with food.      The exam of the esophagus was otherwise normal. No food identified in       esophagus.      Patchy mild inflammation was found in the stomach.      The exam of the stomach was otherwise normal.      Medium-sized periampullary duodenal diverticulum. The duodenal bulb,       first portion of the duodenum and second portion of the duodenum were       otherwise normal. Impression:               - Diverticulum in the distal esophagus.                           - Focal esophagitis versus sloughed mucosa distal  esophagus just proximal to GE junction.                           - Gastritis.                           - Medium-sized periampullary duodenal diverticulum,                            otherwise normal duodenal bulb, first portion of                            the duodenum and second portion of the duodenum.                           - Otherwise normal endoscopy. Moderate Sedation:      None Recommendation:           - Discharge patient to home (via wheelchair).                           - Clear liquid diet  today.                           - Return to GI clinic (Dr. Elicia) in near                            future. Would consider repeat EGD after 6-8 weeks                            PPI therapy and off PPI to biopsy distal                            esophagitis (?) once off Plavix . Procedure Code(s):        --- Professional ---                           [phone], Esophagogastroduodenoscopy, flexible,                            transoral; diagnostic, including collection of                            specimen(s) by brushing or washing, when performed                            (separate procedure) Diagnosis Code(s):        --- Professional ---                           Q39.6, Congenital diverticulum of esophagus                           K20.90, Esophagitis, unspecified without bleeding                           K29.70, Gastritis, unspecified, without bleeding  R13.10, Dysphagia, unspecified                           T18.108A, Unspecified foreign body in esophagus                            causing other injury, initial encounter CPT copyright 2022 American [employer] Association. All rights reserved. The codes documented in this report are preliminary and upon coder review may  be revised to meet current compliance requirements. Elsie Cree, MD 08/08/2024 10:16:54 PM This report has been signed electronically. Number of Addenda: 0

## 2024-08-08 NOTE — Discharge Instructions (Signed)
 Endoscopy Care After Please read the instructions outlined below and refer to this sheet in the next few weeks. These discharge instructions provide you with general information on caring for yourself after you leave the hospital. Your doctor may also give you specific instructions. While your treatment has been planned according to the most current medical practices available, unavoidable complications occasionally occur. If you have any problems or questions after discharge, please call Dr. Burnette [facility]) at [phone].  HOME CARE INSTRUCTIONS Activity You may resume your regular activity but move at a slower pace for the next 24 hours.  Take frequent rest periods for the next 24 hours.  Walking will help expel (get rid of) the air and reduce the bloated feeling in your abdomen.  No driving for 24 hours (because of the anesthesia (medicine) used during the test).  You may shower.  Do not sign any important legal documents or operate any machinery for 24 hours (because of the anesthesia used during the test).  Nutrition Drink plenty of fluids.  Clear liquid diet only today. Tomorrow, try soft/pureed foods. Avoid alcoholic beverages for 24 hours or as instructed by your caregiver.  Medications You may resume your normal medications unless your caregiver tells you otherwise. What you can expect today You may experience abdominal discomfort such as a feeling of fullness or gas pains.  You may experience a sore throat for 2 to 3 days. This is normal. Gargling with salt water may help this.   SEEK IMMEDIATE MEDICAL CARE IF: You have excessive nausea (feeling sick to your stomach) and/or vomiting.  You have severe abdominal pain and distention (swelling).  You have trouble swallowing.  You have a temperature over 100 F (37.8 C).  You have rectal bleeding or vomiting of blood.  Document Released: 06/15/2004 Document Revised: 07/14/2011 Document Reviewed:  12/27/2007 [facility] Patient Information 2012 [place], [place].

## 2024-08-08 NOTE — H&P (Signed)
 [facility] H/P Note  Chief Complaint: suspected esophageal food impaction  HPI: Bob Elliott is an 83 y.o. male.  Here for dysphagia, sialorrhea.  Started this afternoon after eating sausage.  Unable to tolerate secretions.  Prior history of dysphagia, last endoscopy May 2024 Dr. 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  On clopidigrel.  Past Medical History:  Diagnosis Date   Arthritis    BPH (benign prostatic hyperplasia)    CKD (chronic kidney disease), stage II    a. CKD II-III by labs.   Dizziness 10/21/2016   Esophageal dysmotility    Essential hypertension    Trial off acei 08/12/2017 for unexplained sob/ noct smothering    GERD (gastroesophageal reflux disease)    GI bleed    Glaucoma    BOTH EYES   Hyperlipidemia    Hypertension    Inguinal hernia without mention of obstruction or gangrene, unilateral or unspecified, (not specified as recurrent)-right s/p repair with mesh 05/21/2014   Schatzki's ring    Sinus bradycardia    a. prompting dc of Diltiazem  (?chronotropic incompetence) in 2017.   Sleep apnea    mild no cpap   Thoracic aortic aneurysm (TAA)     Past Surgical History:  Procedure Laterality Date   COLONOSCOPY WITH PROPOFOL  N/A 05/11/2016   Procedure: COLONOSCOPY WITH PROPOFOL ;  Surgeon: Gladis MARLA Louder, MD;  Location: [facility];  Service: Endoscopy;  Laterality: N/A;   EYE SURGERY     CATARACT R  EYE   HERNIA REPAIR     UMBILICAL   INGUINAL HERNIA REPAIR Right 06/18/2014   Procedure: RIGHT INGUINAL HERNIA REPAIR;  Surgeon: Krystal JINNY Russell, MD;  Location: [facility] OR;  Service: General;  Laterality: Right;   INSERTION OF MESH Right 06/18/2014   Procedure: INSERTION OF MESH;  Surgeon: Krystal JINNY Russell, MD;  Location: [facility] OR;  Service: General;  Laterality: Right;   NECK SURGERY  2000   cervical disc surgery(limited ROM)- retained hardware   TONSILLECTOMY      Medications Prior to  Admission  Medication Sig Dispense Refill   clopidogrel  (PLAVIX ) 75 MG tablet Take 1 tablet (75 mg total) by mouth daily. 90 tablet 2   amLODipine  (NORVASC ) 5 MG tablet Take 1 tablet (5 mg total) by mouth daily. 90 tablet 0   clotrimazole-betamethasone  (LOTRISONE) cream Apply 1 Application topically 2 (two) times daily.     doxazosin (CARDURA) 4 MG tablet Take 4 mg by mouth at bedtime.      doxycycline  (VIBRA -TABS) 100 MG tablet Take 1 tablet (100 mg total) by mouth 2 (two) times daily. 14 tablet 0   esomeprazole  (NEXIUM ) 40 MG capsule Take 40 mg by mouth 2 (two) times daily.     Evolocumab  (REPATHA  SURECLICK) 140 MG/ML SOAJ Inject 140 mg into the skin every 14 (fourteen) days. 2 mL 11   fluticasone (FLONASE) 50 MCG/ACT nasal spray Place 2 sprays into both nostrils daily as needed for allergies or rhinitis.      guaiFENesin -dextromethorphan (ROBITUSSIN DM) 100-10 MG/5ML syrup Take 5 mLs by mouth 3 (three) times daily as needed for cough. 118 mL 0   irbesartan  (AVAPRO ) 150 MG tablet TAKE 1 TABLET DAILY (DOSE DECREASE) 90 tablet 0   lidocaine  (LIDODERM ) 5 % Place 1 patch onto the skin daily. Remove & Discard patch within 12 hours or as directed by MD 30 patch 0   loratadine (CLARITIN) 10 MG tablet Take 10 mg by mouth daily.  potassium chloride  (KLOR-CON  M) 10 MEQ tablet Take 1 tablet (10 mEq total) by mouth daily. 90 tablet 1   solifenacin (VESICARE) 5 MG tablet Take 5 mg by mouth as needed.     spironolactone  (ALDACTONE ) 25 MG tablet Take 0.5 tablets (12.5 mg total) by mouth daily. 45 tablet 3   timolol (TIMOPTIC) 0.5 % ophthalmic solution Place 1 drop into both eyes daily.   4    Allergies:  Allergies  Allergen Reactions   Aspirin Hives, Itching and Nausea Only    Other reaction(s): GI Upset   Codeine Hives, Itching, Nausea Only and Other (See Comments)    Dizzy spells   Pravastatin  Sodium Other (See Comments)    PT C/O MYALGIAS AND JOINT PAIN   Shellfish Allergy Hives    Amitriptyline     Other reaction(s): Intolerant   Atorvastatin      Myalgias on 10-40mg  dosing   Gabapentin     Other reaction(s): Intolerant   Other     Other reaction(s): stomach upset   Repatha  [Evolocumab ]     Fatigue, muscle pain   Rosuvastatin      Myalgias on 5mg  3x/week   Tramadol Hcl     Other reaction(s): grogginess   Zetia  [Ezetimibe ] Other (See Comments)    Pt reports causes joint and muscle aches    Family History  Problem Relation Age of Onset   Heart disease Mother    Stroke Father    Cancer Brother        colon   Diabetes Sister    Leukemia Maternal Grandmother     Social History:  reports that he has never smoked. He has never used smokeless tobacco. He reports current alcohol use. He reports that he does not use drugs.   ROS: As per HPI, all others negative   Blood pressure (!) 155/91, pulse 79, temperature (!) 97.4 F (36.3 C), temperature source Temporal, resp. rate 18, height 5' 8 (1.727 m), weight 90.7 kg, SpO2 96%. General appearance: NAD HEENT:  [place]/AT, anicteric LUNGS:  No respiratory distress CV:  No tachycardia ABD:  Protuberant, soft, non-tender  Results for orders placed or performed during the hospital encounter of 08/08/24 (from the past 48 hours)  CBC with Differential/Platelet     Status: None   Collection Time: 08/08/24  5:23 PM  Result Value Ref Range   WBC 9.0 4.0 - 10.5 K/uL   RBC 4.98 4.22 - 5.81 MIL/uL   Hemoglobin 15.3 13.0 - 17.0 g/dL   HCT 55.0 60.9 - 47.9 %   MCV 90.2 80.0 - 100.0 fL   MCH 30.7 26.0 - 34.0 pg   MCHC 34.1 30.0 - 36.0 g/dL   RDW 86.8 88.4 - 84.4 %   Platelets 174 150 - 400 K/uL   nRBC 0.0 0.0 - 0.2 %   Neutrophils Relative % 78 %   Neutro Abs 7.0 1.7 - 7.7 K/uL   Lymphocytes Relative 12 %   Lymphs Abs 1.1 0.7 - 4.0 K/uL   Monocytes Relative 8 %   Monocytes Absolute 0.8 0.1 - 1.0 K/uL   Eosinophils Relative 1 %   Eosinophils Absolute 0.1 0.0 - 0.5 K/uL   Basophils Relative 0 %   Basophils Absolute  0.0 0.0 - 0.1 K/uL   Immature Granulocytes 1 %   Abs Immature Granulocytes 0.05 0.00 - 0.07 K/uL    Comment: Performed at [facility], [address], [place], [place] [address]  Basic metabolic panel with GFR  Status: Abnormal   Collection Time: 08/08/24  5:23 PM  Result Value Ref Range   Sodium 137 135 - 145 mmol/L   Potassium 4.6 3.5 - 5.1 mmol/L    Comment: HEMOLYSIS AT THIS LEVEL MAY AFFECT RESULT   Chloride 103 98 - 111 mmol/L   CO2 24 22 - 32 mmol/L   Glucose, Bld 106 (H) 70 - 99 mg/dL    Comment: Glucose reference range applies only to samples taken after fasting for at least 8 hours.   BUN 20 8 - 23 mg/dL   Creatinine, Ser 8.69 (H) 0.61 - 1.24 mg/dL   Calcium  8.9 8.9 - 10.3 mg/dL   GFR, Estimated 54 (L) >60 mL/min    Comment: (NOTE) Calculated using the CKD-EPI Creatinine Equation (2021)    Anion gap 10 5 - 15    Comment: Performed at [facility], [address] [place], [place] [address]   DG Chest Port 1 View Result Date: 08/08/2024 CLINICAL DATA:  Foreign body sensation. EXAM: PORTABLE CHEST 1 VIEW COMPARISON:  CT chest 12/30/2023. FINDINGS: No radiopaque foreign body identified in the region of the esophagus. The heart size and mediastinal contours are within normal limits. Both lungs are clear. There is no acute fracture. Cervical spinal fusion plate present. IMPRESSION: 1. No radiopaque foreign body identified in the region of the esophagus. 2. No acute cardiopulmonary process. Electronically Signed   By: Greig Pique M.D.   On: 08/08/2024 17:53    Assessment/Plan   Suspected esophageal food impaction. Dysphagia. Endoscopy with possible foreign body removal. Risks (bleeding, infection, bowel perforation that could require surgery, sedation-related changes in cardiopulmonary systems), benefits (identification and possible treatment of source of symptoms, exclusion of certain causes of symptoms), and alternatives (watchful waiting, radiographic imaging  studies, empiric medical treatment) of upper endoscopy (EGD) were explained to patient/family in detail and patient wishes to proceed.   BURNETTE ELSIE HERO 08/08/2024, 9:33 PM

## 2024-08-08 NOTE — ED Triage Notes (Signed)
 Pt states he has a hot dog stuck in his esophagus or caused his esophagus to close after it passed.  States he is unable to swallow his own spit now. Pt's states his feels like he is moving air & doesn't feel SOB.

## 2024-08-08 NOTE — Anesthesia Procedure Notes (Signed)
 Procedure Name: Intubation Date/Time: 08/08/2024 9:50 PM  Performed by: Tiaja Hagan T, CRNAPre-anesthesia Checklist: Patient identified, Emergency Drugs available, Suction available and Patient being monitored Patient Re-evaluated:Patient Re-evaluated prior to induction Oxygen Delivery Method: Circle system utilized Preoxygenation: Pre-oxygenation with 100% oxygen Induction Type: IV induction Ventilation: Mask ventilation without difficulty Laryngoscope Size: Mac and 4 Grade View: Grade I Tube type: Oral Tube size: 7.5 mm Number of attempts: 1 Airway Equipment and Method: Stylet and Oral airway Placement Confirmation: ETT inserted through vocal cords under direct vision, positive ETCO2 and breath sounds checked- equal and bilateral Secured at: 22 cm Tube secured with: Tape Dental Injury: Teeth and Oropharynx as per pre-operative assessment

## 2024-08-08 NOTE — Transfer of Care (Signed)
 Immediate Anesthesia Transfer of Care Note  Patient: Bob Elliott  Procedure(s) Performed: EGD (ESOPHAGOGASTRODUODENOSCOPY)  Patient Location: PACU  Anesthesia Type:General  Level of Consciousness: awake, alert , and oriented  Airway & Oxygen Therapy: Patient Spontanous Breathing and Patient connected to nasal cannula oxygen  Post-op Assessment: Report given to RN, Post -op Vital signs reviewed and stable, and Patient moving all extremities  Post vital signs: Reviewed and stable  Last Vitals:  Vitals Value Taken Time  BP 132/83 08/08/24 22:11  Temp 36.8 C 08/08/24 22:11  Pulse 86 08/08/24 22:14  Resp 16 08/08/24 22:14  SpO2 94 % 08/08/24 22:14  Vitals shown include unfiled device data.  Last Pain:  Vitals:   08/08/24 2120  TempSrc: Temporal  PainSc: 3          Complications: No notable events documented.

## 2024-08-08 NOTE — ED Provider Notes (Addendum)
 [place] EMERGENCY DEPARTMENT AT [facility] Provider Note   CSN: [id number] Arrival date & time: 08/08/24  1503     Patient presents with: Swallowed Foreign Body   Bob Elliott is a 83 y.o. male.   Patient was eating a [place] sausage at around 1600 today.  He got stuck.  He did a lot of coughing he thinks to be vomited up.  Since patient get back to the room he has not been spitting out any saliva has been going down fine.  Still has a funny sensation in the midsternal area.  Patient has been followed by gastroenterology in the past and has had esophageal dilatation done.  He is not able to remember who that was but he said it was here in [place].  Chart review unable to come up with who that was it saw him.  Will give him a liquid and then food challenge.  Past medical history and for hypertension gastroesophageal reflux disease esophageal dysmotility Schatzki's ring hypertension thoracic aneurysm and chronic kidney disease.  Patient's daughter is here with him.       Prior to Admission medications   Medication Sig Start Date End Date Taking? Authorizing Provider  amLODipine  (NORVASC ) 5 MG tablet Take 1 tablet (5 mg total) by mouth daily. 07/25/24   Verlin Lonni BIRCH, MD  clopidogrel  (PLAVIX ) 75 MG tablet Take 1 tablet (75 mg total) by mouth daily. 06/16/23   Hobart Powell BRAVO, MD  clotrimazole-betamethasone  (LOTRISONE) cream Apply 1 Application topically 2 (two) times daily. 08/05/22   [provider]  doxazosin (CARDURA) 4 MG tablet Take 4 mg by mouth at bedtime.  02/12/14   [provider]  doxycycline  (VIBRA -TABS) 100 MG tablet Take 1 tablet (100 mg total) by mouth 2 (two) times daily. 04/29/23   Gershon Donnice JONELLE, DPM  esomeprazole  (NEXIUM ) 40 MG capsule Take 40 mg by mouth 2 (two) times daily. 08/15/22   [provider]  Evolocumab  (REPATHA  SURECLICK) 140 MG/ML SOAJ Inject 140 mg into the skin every 14 (fourteen) days. 02/20/24    Verlin Lonni BIRCH, MD  fluticasone (FLONASE) 50 MCG/ACT nasal spray Place 2 sprays into both nostrils daily as needed for allergies or rhinitis.  03/28/14   [provider]  guaiFENesin -dextromethorphan (ROBITUSSIN DM) 100-10 MG/5ML syrup Take 5 mLs by mouth 3 (three) times daily as needed for cough. 06/24/21   Patsey Lot, MD  irbesartan  (AVAPRO ) 150 MG tablet TAKE 1 TABLET DAILY (DOSE DECREASE) 07/27/24   Verlin Lonni BIRCH, MD  lidocaine  (LIDODERM ) 5 % Place 1 patch onto the skin daily. Remove & Discard patch within 12 hours or as directed by MD 11/29/23   Claudene Lenis, NP  loratadine (CLARITIN) 10 MG tablet Take 10 mg by mouth daily.    [provider]  potassium chloride  (KLOR-CON  M) 10 MEQ tablet Take 1 tablet (10 mEq total) by mouth daily. 04/15/23   Hobart Powell BRAVO, MD  solifenacin (VESICARE) 5 MG tablet Take 5 mg by mouth as needed. 05/23/20   [provider]  spironolactone  (ALDACTONE ) 25 MG tablet Take 0.5 tablets (12.5 mg total) by mouth daily. 11/23/23   Verlin Lonni BIRCH, MD  timolol (TIMOPTIC) 0.5 % ophthalmic solution Place 1 drop into both eyes daily.  11/25/17   [provider]    Allergies: Aspirin, Codeine, Pravastatin  sodium, Shellfish allergy, Amitriptyline, Atorvastatin , Gabapentin, Other, Repatha  [evolocumab ], Rosuvastatin , Tramadol hcl, and Zetia  [ezetimibe ]    Review of Systems  Constitutional:  Negative for  chills and fever.  HENT:  Positive for trouble swallowing. Negative for ear pain and sore throat.   Eyes:  Negative for pain and visual disturbance.  Respiratory:  Negative for cough and shortness of breath.   Cardiovascular:  Negative for chest pain and palpitations.  Gastrointestinal:  Negative for abdominal pain and vomiting.  Genitourinary:  Negative for dysuria and hematuria.  Musculoskeletal:  Negative for arthralgias and back pain.  Skin:  Negative for color change and rash.  Neurological:  Negative for  seizures and syncope.  All other systems reviewed and are negative.   Updated Vital Signs BP (!) 151/70   Pulse 64   Temp 97.8 F (36.6 C)   Resp (!) 21   Ht 1.727 m (5' 8)   Wt 90.7 kg   SpO2 98%   BMI 30.41 kg/m   Physical Exam Vitals and nursing note reviewed.  Constitutional:      General: He is not in acute distress.    Appearance: Normal appearance. He is well-developed.  HENT:     Head: Normocephalic and atraumatic.     Mouth/Throat:     Mouth: Mucous membranes are moist.  Eyes:     Extraocular Movements: Extraocular movements intact.     Conjunctiva/sclera: Conjunctivae normal.     Pupils: Pupils are equal, round, and reactive to light.  Cardiovascular:     Rate and Rhythm: Normal rate and regular rhythm.     Heart sounds: No murmur heard. Pulmonary:     Effort: Pulmonary effort is normal. No respiratory distress.     Breath sounds: Normal breath sounds. No wheezing, rhonchi or rales.  Abdominal:     Palpations: Abdomen is soft.     Tenderness: There is no abdominal tenderness.  Musculoskeletal:        General: No swelling.     Cervical back: Neck supple.  Skin:    General: Skin is warm and dry.     Capillary Refill: Capillary refill takes less than 2 seconds.  Neurological:     General: No focal deficit present.     Mental Status: He is alert and oriented to person, place, and time.  Psychiatric:        Mood and Affect: Mood normal.     (all labs ordered are listed, but only abnormal results are displayed) Labs Reviewed  CBC WITH DIFFERENTIAL/PLATELET  BASIC METABOLIC PANEL WITH GFR    EKG: None  Radiology: No results found.   Procedures   Medications Ordered in the ED - No data to display                                  Medical Decision Making Amount and/or Complexity of Data Reviewed Labs: ordered. Radiology: ordered.  Risk Prescription drug management.   Patient is no longer spitting up saliva.  Feels like the sausage went  down.  Will give him a liquid and food challenge.  CBC white count 9 hemoglobin 15.3 platelets 175.  Basic metabolic panel still pending.  Portable chest x-ray pending.  Chest x-ray without any acute findings.  Basic metabolic panel GFR 54 creatinine 1.3.  Gave patient challenge with ginger ale and crackers.  And he failed.  Even though saliva has been going down for the most part.  Feel that he does have esophageal obstruction.  Will contact gastroenterology.  CRITICAL CARE Performed by: Aubreigh Fuerte Total critical care time: 45 minutes  Critical care time was exclusive of separately billable procedures and treating other patients. Critical care was necessary to treat or prevent imminent or life-threatening deterioration. Critical care was time spent personally by me on the following activities: development of treatment plan with patient and/or surrogate as well as nursing, discussions with consultants, evaluation of patient's response to treatment, examination of patient, obtaining history from patient or surrogate, ordering and performing treatments and interventions, ordering and review of laboratory studies, ordering and review of radiographic studies, pulse oximetry and re-evaluation of patient's condition.  Discussed with [facility] they were able to confirm that he is actually followed by [facility].  They saw him in May 2024 in the office.  Will contact [facility] gastroenterology.  [facility] will probably discharge patient home from PACU.  They said there was no food impaction but there was some sloughing of the esophagus due to probably esophagitis.  Final diagnoses:  Food impaction of esophagus, initial encounter    ED Discharge Orders     None          Geraldene Hamilton, MD 08/08/24 1749    Geraldene Hamilton, MD 08/08/24 NANCYANN    Geraldene Hamilton, MD 08/08/24 LEAFY    Geraldene Hamilton, MD 08/08/24 2219

## 2024-08-09 ENCOUNTER — Encounter (HOSPITAL_COMMUNITY): Payer: Self-pay | Admitting: Gastroenterology

## 2024-08-10 NOTE — Anesthesia Postprocedure Evaluation (Signed)
 Anesthesia Post Note  Patient: Bob Elliott  Procedure(s) Performed: EGD (ESOPHAGOGASTRODUODENOSCOPY)     Patient location during evaluation: PACU Anesthesia Type: General Level of consciousness: awake and alert Pain management: pain level controlled Vital Signs Assessment: post-procedure vital signs reviewed and stable Respiratory status: spontaneous breathing, nonlabored ventilation, respiratory function stable and patient connected to nasal cannula oxygen Cardiovascular status: blood pressure returned to baseline and stable Postop Assessment: no apparent nausea or vomiting Anesthetic complications: no   No notable events documented.  Last Vitals:  Vitals:   08/08/24 2230 08/08/24 2238  BP: 136/79 (!) 165/81  Pulse: 82 81  Resp: (!) 23 (!) 25  Temp:  36.4 C  SpO2: 93% 93%    Last Pain:  Vitals:   08/08/24 2215  TempSrc:   PainSc: 3                  Epifanio Lamar BRAVO

## 2024-08-14 DIAGNOSIS — E782 Mixed hyperlipidemia: Secondary | ICD-10-CM | POA: Diagnosis not present

## 2024-08-14 DIAGNOSIS — N182 Chronic kidney disease, stage 2 (mild): Secondary | ICD-10-CM | POA: Diagnosis not present

## 2024-08-14 DIAGNOSIS — M1990 Unspecified osteoarthritis, unspecified site: Secondary | ICD-10-CM | POA: Diagnosis not present

## 2024-08-14 DIAGNOSIS — N400 Enlarged prostate without lower urinary tract symptoms: Secondary | ICD-10-CM | POA: Diagnosis not present

## 2024-08-15 DIAGNOSIS — H401131 Primary open-angle glaucoma, bilateral, mild stage: Secondary | ICD-10-CM | POA: Diagnosis not present

## 2024-08-17 DIAGNOSIS — Z23 Encounter for immunization: Secondary | ICD-10-CM | POA: Diagnosis not present

## 2024-08-23 DIAGNOSIS — Z23 Encounter for immunization: Secondary | ICD-10-CM | POA: Diagnosis not present

## 2024-08-27 ENCOUNTER — Other Ambulatory Visit: Payer: Self-pay

## 2024-08-27 DIAGNOSIS — R0609 Other forms of dyspnea: Secondary | ICD-10-CM

## 2024-08-27 DIAGNOSIS — E785 Hyperlipidemia, unspecified: Secondary | ICD-10-CM

## 2024-08-27 DIAGNOSIS — R079 Chest pain, unspecified: Secondary | ICD-10-CM

## 2024-08-28 MED ORDER — CLOPIDOGREL BISULFATE 75 MG PO TABS: 75.0000 mg | ORAL_TABLET | Freq: Every day | ORAL | 0 refills | Status: AC

## 2024-09-12 DIAGNOSIS — K224 Dyskinesia of esophagus: Secondary | ICD-10-CM | POA: Diagnosis not present

## 2024-09-12 DIAGNOSIS — R1319 Other dysphagia: Secondary | ICD-10-CM | POA: Diagnosis not present

## 2024-09-14 DIAGNOSIS — N182 Chronic kidney disease, stage 2 (mild): Secondary | ICD-10-CM | POA: Diagnosis not present

## 2024-09-14 DIAGNOSIS — N400 Enlarged prostate without lower urinary tract symptoms: Secondary | ICD-10-CM | POA: Diagnosis not present

## 2024-09-14 DIAGNOSIS — E782 Mixed hyperlipidemia: Secondary | ICD-10-CM | POA: Diagnosis not present

## 2024-09-14 DIAGNOSIS — M1990 Unspecified osteoarthritis, unspecified site: Secondary | ICD-10-CM | POA: Diagnosis not present

## 2024-09-21 ENCOUNTER — Ambulatory Visit (HOSPITAL_COMMUNITY)
Admission: RE | Admit: 2024-09-21 | Discharge: 2024-09-21 | Disposition: A | Discharge status: Home / Self Care | Source: Ambulatory Visit | Attending: Internal Medicine | Admitting: Internal Medicine

## 2024-09-21 ENCOUNTER — Ambulatory Visit: Payer: Self-pay | Admitting: Cardiovascular Disease

## 2024-09-21 DIAGNOSIS — I351 Nonrheumatic aortic (valve) insufficiency: Secondary | ICD-10-CM | POA: Insufficient documentation

## 2024-09-21 LAB — ECHOCARDIOGRAM COMPLETE
AR max vel: 3.27 cm2
AV Area VTI: 3.27 cm2
AV Area mean vel: 3.44 cm2
AV Mean grad: 4 mmHg
AV Peak grad: 9.1 mmHg
Ao pk vel: 1.51 m/s
Area-P 1/2: 2.96 cm2
Calc EF: 68.2 %
P 1/2 time: 646 ms
S' Lateral: 2.3 cm
Single Plane A2C EF: 70.3 %
Single Plane A4C EF: 64.8 %

## 2024-10-09 ENCOUNTER — Other Ambulatory Visit: Payer: Self-pay | Admitting: Cardiovascular Disease

## 2024-10-09 DIAGNOSIS — E785 Hyperlipidemia, unspecified: Secondary | ICD-10-CM

## 2024-10-09 DIAGNOSIS — I10 Essential (primary) hypertension: Secondary | ICD-10-CM

## 2024-10-09 DIAGNOSIS — Z79899 Other long term (current) drug therapy: Secondary | ICD-10-CM

## 2024-10-14 DIAGNOSIS — M1990 Unspecified osteoarthritis, unspecified site: Secondary | ICD-10-CM | POA: Diagnosis not present

## 2024-10-14 DIAGNOSIS — E782 Mixed hyperlipidemia: Secondary | ICD-10-CM | POA: Diagnosis not present

## 2024-10-14 DIAGNOSIS — N400 Enlarged prostate without lower urinary tract symptoms: Secondary | ICD-10-CM | POA: Diagnosis not present

## 2024-10-14 DIAGNOSIS — N182 Chronic kidney disease, stage 2 (mild): Secondary | ICD-10-CM | POA: Diagnosis not present

## 2024-10-15 DIAGNOSIS — M519 Unspecified thoracic, thoracolumbar and lumbosacral intervertebral disc disorder: Secondary | ICD-10-CM | POA: Diagnosis not present

## 2024-10-15 DIAGNOSIS — M1990 Unspecified osteoarthritis, unspecified site: Secondary | ICD-10-CM | POA: Diagnosis not present

## 2024-10-15 DIAGNOSIS — Z23 Encounter for immunization: Secondary | ICD-10-CM | POA: Diagnosis not present

## 2024-10-15 DIAGNOSIS — G720 Drug-induced myopathy: Secondary | ICD-10-CM | POA: Diagnosis not present

## 2024-10-15 DIAGNOSIS — Z8639 Personal history of other endocrine, nutritional and metabolic disease: Secondary | ICD-10-CM | POA: Diagnosis not present

## 2024-10-15 DIAGNOSIS — K219 Gastro-esophageal reflux disease without esophagitis: Secondary | ICD-10-CM | POA: Diagnosis not present

## 2024-10-15 DIAGNOSIS — I719 Aortic aneurysm of unspecified site, without rupture: Secondary | ICD-10-CM | POA: Diagnosis not present

## 2024-10-15 DIAGNOSIS — I10 Essential (primary) hypertension: Secondary | ICD-10-CM | POA: Diagnosis not present

## 2024-10-15 DIAGNOSIS — N1831 Chronic kidney disease, stage 3a: Secondary | ICD-10-CM | POA: Diagnosis not present

## 2024-10-15 DIAGNOSIS — R7303 Prediabetes: Secondary | ICD-10-CM | POA: Diagnosis not present

## 2024-10-15 DIAGNOSIS — M109 Gout, unspecified: Secondary | ICD-10-CM | POA: Diagnosis not present

## 2024-10-15 DIAGNOSIS — K222 Esophageal obstruction: Secondary | ICD-10-CM | POA: Diagnosis not present

## 2024-12-19 ENCOUNTER — Ambulatory Visit: Admitting: Cardiovascular Disease

## 2024-12-19 VITALS — BP 120/62 | HR 69 | Ht 68.0 in | Wt 197.0 lb

## 2024-12-19 DIAGNOSIS — E782 Mixed hyperlipidemia: Secondary | ICD-10-CM

## 2024-12-19 DIAGNOSIS — I10 Essential (primary) hypertension: Secondary | ICD-10-CM | POA: Diagnosis not present

## 2024-12-19 DIAGNOSIS — E785 Hyperlipidemia, unspecified: Secondary | ICD-10-CM

## 2024-12-19 DIAGNOSIS — I351 Nonrheumatic aortic (valve) insufficiency: Secondary | ICD-10-CM

## 2024-12-19 DIAGNOSIS — I7121 Aneurysm of the ascending aorta, without rupture: Secondary | ICD-10-CM

## 2024-12-19 LAB — BASIC METABOLIC PANEL WITH GFR
BUN/Creatinine Ratio: 12 (ref 10–24)
BUN: 15 mg/dL (ref 8–27)
CO2: 22 mmol/L (ref 20–29)
Calcium: 8.9 mg/dL (ref 8.6–10.2)
Chloride: 103 mmol/L (ref 96–106)
Creatinine, Ser: 1.28 mg/dL — ABNORMAL HIGH (ref 0.76–1.27)
Glucose: 95 mg/dL (ref 70–99)
Potassium: 4.7 mmol/L (ref 3.5–5.2)
Sodium: 138 mmol/L (ref 134–144)
eGFR: 55 mL/min/{1.73_m2} — ABNORMAL LOW

## 2024-12-19 LAB — LIPID PANEL
Chol/HDL Ratio: 3.2 ratio (ref 0.0–5.0)
Cholesterol, Total: 151 mg/dL (ref 100–199)
HDL: 47 mg/dL
LDL Chol Calc (NIH): 86 mg/dL (ref 0–99)
Triglycerides: 95 mg/dL (ref 0–149)
VLDL Cholesterol Cal: 18 mg/dL (ref 5–40)

## 2024-12-19 NOTE — Patient Instructions (Signed)
 Medication Instructions:  The current medical regimen is effective;  continue present plan and medications.  *If you need a refill on your cardiac medications before your next appointment, please call your pharmacy*  Lab Work: Please have blood work today (Lipid/BMP) If you have labs (blood work) drawn today and your tests are completely normal, you will receive your results only by: MyChart Message (if you have MyChart) OR A paper copy in the mail If you have any lab test that is abnormal or we need to change your treatment, we will call you to review the results.  Follow-Up: At [facility], you and your health needs are our priority.  As part of our continuing mission to provide you with exceptional heart care, our providers are all part of one team.  This team includes your primary Cardiologist (physician) and Advanced Practice Providers or APPs (Physician Assistants and Nurse Practitioners) who all work together to provide you with the care you need, when you need it.  Your next appointment:   1 year(s)  Provider:   Lonni Cash, MD    We recommend signing up for the patient portal called MyChart.  Sign up information is provided on this After Visit Summary.  MyChart is used to connect with patients for Virtual Visits (Telemedicine).  Patients are able to view lab/test results, encounter notes, upcoming appointments, etc.  Non-urgent messages can be sent to your provider as well.   To learn more about what you can do with MyChart, go to [email or web address].

## 2024-12-20 ENCOUNTER — Ambulatory Visit: Payer: Self-pay | Admitting: Cardiovascular Disease
